# Patient Record
Sex: Female | Born: 1985
Health system: Southern US, Community
[De-identification: ages and names within clinical notes are randomized; demographics above are authoritative.]

## PROBLEM LIST (undated history)

## (undated) DIAGNOSIS — O139 Gestational [pregnancy-induced] hypertension without significant proteinuria, unspecified trimester: Secondary | ICD-10-CM

## (undated) DIAGNOSIS — J45909 Unspecified asthma, uncomplicated: Secondary | ICD-10-CM

## (undated) DIAGNOSIS — F419 Anxiety disorder, unspecified: Secondary | ICD-10-CM

## (undated) DIAGNOSIS — R011 Cardiac murmur, unspecified: Secondary | ICD-10-CM

## (undated) DIAGNOSIS — Z8719 Personal history of other diseases of the digestive system: Secondary | ICD-10-CM

## (undated) DIAGNOSIS — Z8759 Personal history of other complications of pregnancy, childbirth and the puerperium: Secondary | ICD-10-CM

## (undated) DIAGNOSIS — Z21 Asymptomatic human immunodeficiency virus [HIV] infection status: Secondary | ICD-10-CM

## (undated) DIAGNOSIS — K8020 Calculus of gallbladder without cholecystitis without obstruction: Secondary | ICD-10-CM

## (undated) DIAGNOSIS — O24419 Gestational diabetes mellitus in pregnancy, unspecified control: Secondary | ICD-10-CM

## (undated) DIAGNOSIS — M722 Plantar fascial fibromatosis: Secondary | ICD-10-CM

## (undated) DIAGNOSIS — B20 Human immunodeficiency virus [HIV] disease: Secondary | ICD-10-CM

## (undated) DIAGNOSIS — N946 Dysmenorrhea, unspecified: Secondary | ICD-10-CM

## (undated) DIAGNOSIS — A599 Trichomoniasis, unspecified: Secondary | ICD-10-CM

## (undated) DIAGNOSIS — B009 Herpesviral infection, unspecified: Secondary | ICD-10-CM

## (undated) HISTORY — DX: Calculus of gallbladder without cholecystitis without obstruction: K80.20

## (undated) HISTORY — DX: Personal history of other complications of pregnancy, childbirth and the puerperium: Z87.59

## (undated) HISTORY — DX: Personal history of other diseases of the digestive system: Z87.19

## (undated) HISTORY — DX: Gestational diabetes mellitus in pregnancy, unspecified control: O24.419

## (undated) HISTORY — DX: Unspecified asthma, uncomplicated: J45.909

## (undated) HISTORY — PX: WISDOM TOOTH EXTRACTION: SHX21

## (undated) HISTORY — DX: Human immunodeficiency virus (HIV) disease: B20

## (undated) HISTORY — DX: Dysmenorrhea, unspecified: N94.6

## (undated) HISTORY — DX: Asymptomatic human immunodeficiency virus (hiv) infection status: Z21

---

## 2006-03-02 ENCOUNTER — Other Ambulatory Visit: Admission: RE | Admit: 2006-03-02 | Discharge: 2006-03-02 | Payer: Self-pay | Admitting: Obstetrics and Gynecology

## 2008-02-05 ENCOUNTER — Emergency Department (HOSPITAL_COMMUNITY): Admission: EM | Admit: 2008-02-05 | Discharge: 2008-02-05 | Payer: Self-pay | Admitting: Emergency Medicine

## 2009-08-25 ENCOUNTER — Emergency Department (HOSPITAL_COMMUNITY): Admission: EM | Admit: 2009-08-25 | Discharge: 2009-08-25 | Payer: Self-pay | Admitting: Emergency Medicine

## 2009-08-27 ENCOUNTER — Ambulatory Visit (HOSPITAL_COMMUNITY): Admission: RE | Admit: 2009-08-27 | Discharge: 2009-08-27 | Payer: Self-pay | Admitting: Family Medicine

## 2009-09-16 ENCOUNTER — Emergency Department (HOSPITAL_COMMUNITY): Admission: EM | Admit: 2009-09-16 | Discharge: 2009-09-16 | Payer: Self-pay | Admitting: Emergency Medicine

## 2011-03-13 LAB — POCT RAPID STREP A (OFFICE): Streptococcus, Group A Screen (Direct): NEGATIVE

## 2011-03-13 LAB — STREP A DNA PROBE: Group A Strep Probe: NEGATIVE

## 2011-08-29 LAB — BASIC METABOLIC PANEL
BUN: 12
Calcium: 8.3 — ABNORMAL LOW
Creatinine, Ser: 0.93
GFR calc non Af Amer: >60
Glucose, Bld: 102 — ABNORMAL HIGH

## 2011-08-29 LAB — PREGNANCY, URINE: Preg Test, Ur: NEGATIVE

## 2011-08-29 LAB — CBC
HCT: 39.9
Platelets: 140 — ABNORMAL LOW
RDW: 13.6

## 2011-08-29 LAB — URINALYSIS, ROUTINE W REFLEX MICROSCOPIC
Ketones, ur: >80 — AB
Nitrite: NEGATIVE
Specific Gravity, Urine: >1.03 — ABNORMAL HIGH
Urobilinogen, UA: 0.2

## 2011-08-29 LAB — DIFFERENTIAL
Basophils Absolute: 0
Eosinophils Relative: 0
Lymphocytes Relative: 26
Neutro Abs: 2.4

## 2011-08-29 LAB — URINE MICROSCOPIC-ADD ON

## 2012-07-07 ENCOUNTER — Ambulatory Visit (INDEPENDENT_AMBULATORY_CARE_PROVIDER_SITE_OTHER): Payer: BC Managed Care – HMO | Admitting: Obstetrics and Gynecology

## 2012-07-07 ENCOUNTER — Encounter: Payer: Self-pay | Admitting: Obstetrics and Gynecology

## 2012-07-07 VITALS — BP 98/70 | HR 68 | Ht 64.75 in | Wt 152.0 lb

## 2012-07-07 DIAGNOSIS — Z124 Encounter for screening for malignant neoplasm of cervix: Secondary | ICD-10-CM

## 2012-07-07 DIAGNOSIS — Z113 Encounter for screening for infections with a predominantly sexual mode of transmission: Secondary | ICD-10-CM

## 2012-07-07 DIAGNOSIS — Z01419 Encounter for gynecological examination (general) (routine) without abnormal findings: Secondary | ICD-10-CM

## 2012-07-07 NOTE — Progress Notes (Signed)
Subjective:    Barbara Marshall is a 26 y.o. female, G0P0, who presents for an annual exam. The patient reports no problems  Menstrual cycle:   LMP: Patient's last menstrual period was 06/16/2012. Flow x 7 days; 6/10 cramps relieved with Tylenol             Review of Systems Pertinent items are noted in HPI. Denies pelvic pain, urinary tract symptoms, vaginitis symptoms, irregular bleeding, menopausal symptoms, change in bowel habits or rectal bleeding   Objective:  BP 98/70  Pulse 68  Ht 5' 4.75" (1.645 m)  Wt 152 lb (68.947 kg)  BMI 25.49 kg/m2  LMP 06/16/2012  Ht 5' 4.75" (1.645 m)  Wt 152 lb (68.947 kg)  BMI 25.49 kg/m2  LMP 06/16/2012  Wt Readings from Last 1 Encounters:  07/07/12 152 lb (68.947 kg)   Body mass index is 25.49 kg/(m^2). General Appearance: Alert, no acute distress HEENT: Grossly normal Neck / Thyroid: Supple, no thyromegaly or cervical adenopathy Lungs: Clear to auscultation bilaterally Back: No CVA tenderness Breast Exam: No masses or nodes.No dimpling, nipple retraction or discharge. Cardiovascular: Regular rate and rhythm. 2/6 SEM at LSB Gastrointestinal: Soft, non-tender, no masses or organomegaly Pelvic Exam: EGBUS-wnl, vagina-normal rugae, cervix- without lesions or tenderness, uterus appears normal size shape and consistency, adnexae-no masses or tenderness Lymphatic Exam: Non-palpable nodes in neck, clavicular,  axillary, or inguinal regions  Skin: no rashes or abnormalities Extremities: no clubbing cyanosis or edema  Neurologic: grossly normal Psychiatric: Alert and oriented   Assessment:   Routine GYN Exam  HIV + Plan:  STD-pending  PAP sent  RTO 1 year or prn  Rakeb Kibble,ELMIRAPA-C

## 2012-07-07 NOTE — Progress Notes (Signed)
Regular Periods: yes Mammogram: no  Monthly Breast Ex.: yes Exercise: yes  Tetanus < 10 years: yes Seatbelts: yes  NI. Bladder Functn.: yes Abuse at home: no  Daily BM's: yes Stressful Work: yes  Healthy Diet: yes Sigmoid-Colonoscopy: NO  Calcium: no Medical problems this year: NO PROBLEMS   LAST PAP:2009  Contraception: CONDOMS  Mammogram:  NO  PCP: DR. Tana Felts  PMH: NO  FMH: FATHER DIED IN 2008-05-22 BRAIN CANCER  Last Bone Scan: NO

## 2012-07-09 LAB — PAP IG, CT-NG, RFX HPV ASCU
Chlamydia Probe Amp: NEGATIVE
GC Probe Amp: NEGATIVE

## 2012-07-19 ENCOUNTER — Telehealth: Payer: Self-pay | Admitting: Obstetrics and Gynecology

## 2012-07-19 NOTE — Telephone Encounter (Signed)
TRIAGE/RES °

## 2012-07-20 NOTE — Telephone Encounter (Signed)
Triage/lab res. °

## 2012-07-20 NOTE — Telephone Encounter (Signed)
Pt rtnd call, informed pap was normal, GC/CT negative, RPR, Hep B & C all negative, however did show positive for HSV 2.  Discussed with pt a little bit about what HSV 2 was. Pt voices understanding.  Offered appt if want further discussion, also advised to call if she thinks she gets an outbreak.

## 2012-07-20 NOTE — Telephone Encounter (Signed)
Tc to pt regarding msg, lm on vm to call back. 

## 2015-07-25 DIAGNOSIS — J302 Other seasonal allergic rhinitis: Secondary | ICD-10-CM | POA: Insufficient documentation

## 2016-11-28 ENCOUNTER — Encounter: Payer: Self-pay | Admitting: Nurse Practitioner

## 2016-11-28 ENCOUNTER — Ambulatory Visit (INDEPENDENT_AMBULATORY_CARE_PROVIDER_SITE_OTHER): Payer: BLUE CROSS/BLUE SHIELD | Admitting: Nurse Practitioner

## 2016-11-28 VITALS — BP 128/82 | Ht 63.0 in | Wt 178.0 lb

## 2016-11-28 DIAGNOSIS — F419 Anxiety disorder, unspecified: Secondary | ICD-10-CM

## 2016-11-28 DIAGNOSIS — Z13220 Encounter for screening for lipoid disorders: Secondary | ICD-10-CM

## 2016-11-28 DIAGNOSIS — M722 Plantar fascial fibromatosis: Secondary | ICD-10-CM | POA: Diagnosis not present

## 2016-11-28 DIAGNOSIS — R5383 Other fatigue: Secondary | ICD-10-CM | POA: Diagnosis not present

## 2016-11-28 MED ORDER — ALPRAZOLAM 0.5 MG PO TABS: ORAL_TABLET | ORAL | 0 refills | Status: DC

## 2016-11-28 MED ORDER — MELOXICAM 15 MG PO TABS: 15.0000 mg | ORAL_TABLET | Freq: Every day | ORAL | 0 refills | Status: DC

## 2016-11-28 MED ORDER — CITALOPRAM HYDROBROMIDE 20 MG PO TABS: ORAL_TABLET | ORAL | 0 refills | Status: DC

## 2016-11-28 NOTE — Patient Instructions (Signed)
Plantar Fasciitis Plantar fasciitis is a painful foot condition that affects the heel. It occurs when the band of tissue that connects the toes to the heel bone (plantar fascia) becomes irritated. This can happen after exercising too much or doing other repetitive activities (overuse injury). The pain from plantar fasciitis can range from mild irritation to severe pain that makes it difficult for you to walk or move. The pain is usually worse in the morning or after you have been sitting or lying down for a while. CAUSES This condition may be caused by:  Standing for long periods of time.  Wearing shoes that do not fit.  Doing high-impact activities, including running, aerobics, and ballet.  Being overweight.  Having an abnormal way of walking (gait).  Having tight calf muscles.  Having high arches in your feet.  Starting a new athletic activity. SYMPTOMS The main symptom of this condition is heel pain. Other symptoms include:  Pain that gets worse after activity or exercise.  Pain that is worse in the morning or after resting.  Pain that goes away after you walk for a few minutes. DIAGNOSIS This condition may be diagnosed based on your signs and symptoms. Your health care provider will also do a physical exam to check for:  A tender area on the bottom of your foot.  A high arch in your foot.  Pain when you move your foot.  Difficulty moving your foot. You may also need to have imaging studies to confirm the diagnosis. These can include:  X-rays.  Ultrasound.  MRI. TREATMENT  Treatment for plantar fasciitis depends on the severity of the condition. Your treatment may include:  Rest, ice, and over-the-counter pain medicines to manage your pain.  Exercises to stretch your calves and your plantar fascia.  A splint that holds your foot in a stretched, upward position while you sleep (night splint).  Physical therapy to relieve symptoms and prevent problems in the  future.  Cortisone injections to relieve severe pain.  Extracorporeal shock wave therapy (ESWT) to stimulate damaged plantar fascia with electrical impulses. It is often used as a last resort before surgery.  Surgery, if other treatments have not worked after 12 months. HOME CARE INSTRUCTIONS  Take medicines only as directed by your health care provider.  Avoid activities that cause pain.  Roll the bottom of your foot over a bag of ice or a bottle of cold water. Do this for 20 minutes, 3-4 times a day.  Perform simple stretches as directed by your health care provider.  Try wearing athletic shoes with air-sole or gel-sole cushions or soft shoe inserts.  Wear a night splint while sleeping, if directed by your health care provider.  Keep all follow-up appointments with your health care provider. PREVENTION   Do not perform exercises or activities that cause heel pain.  Consider finding low-impact activities if you continue to have problems.  Lose weight if you need to. The best way to prevent plantar fasciitis is to avoid the activities that aggravate your plantar fascia. SEEK MEDICAL CARE IF:  Your symptoms do not go away after treatment with home care measures.  Your pain gets worse.  Your pain affects your ability to move or do your daily activities. This information is not intended to replace advice given to you by your health care provider. Make sure you discuss any questions you have with your health care provider. Document Released: 08/19/2001 Document Revised: 03/17/2016 Document Reviewed: 10/04/2014 Elsevier Interactive Patient Education    2017 Else[employer] Inc.  

## 2016-11-29 ENCOUNTER — Encounter: Payer: Self-pay | Admitting: Nurse Practitioner

## 2016-11-29 DIAGNOSIS — B20 Human immunodeficiency virus [HIV] disease: Secondary | ICD-10-CM | POA: Insufficient documentation

## 2016-11-29 DIAGNOSIS — F419 Anxiety disorder, unspecified: Secondary | ICD-10-CM | POA: Insufficient documentation

## 2016-11-29 LAB — BASIC METABOLIC PANEL
BUN/Creatinine Ratio: 12 (ref 9–23)
BUN: 10 mg/dL (ref 6–20)
CALCIUM: 9.5 mg/dL (ref 8.7–10.2)
CO2: 23 mmol/L (ref 18–29)
CREATININE: 0.86 mg/dL (ref 0.57–1.00)
Chloride: 98 mmol/L (ref 96–106)
GFR, EST AFRICAN AMERICAN: 105 mL/min/{1.73_m2} (ref >59)
GFR, EST NON AFRICAN AMERICAN: 91 mL/min/{1.73_m2} (ref >59)
Glucose: 100 mg/dL — ABNORMAL HIGH (ref 65–99)
POTASSIUM: 4 mmol/L (ref 3.5–5.2)
Sodium: 137 mmol/L (ref 134–144)

## 2016-11-29 LAB — CBC WITH DIFFERENTIAL/PLATELET
BASOS: 0 %
Basophils Absolute: 0 10*3/uL (ref 0.0–0.2)
EOS (ABSOLUTE): 0.3 10*3/uL (ref 0.0–0.4)
EOS: 4 %
HEMOGLOBIN: 14.1 g/dL (ref 11.1–15.9)
Hematocrit: 42.3 % (ref 34.0–46.6)
IMMATURE GRANS (ABS): 0 10*3/uL (ref 0.0–0.1)
IMMATURE GRANULOCYTES: 0 %
LYMPHS: 21 %
Lymphocytes Absolute: 1.8 10*3/uL (ref 0.7–3.1)
MCH: 31.1 pg (ref 26.6–33.0)
MCHC: 33.3 g/dL (ref 31.5–35.7)
MCV: 93 fL (ref 79–97)
MONOCYTES: 7 %
Monocytes Absolute: 0.5 10*3/uL (ref 0.1–0.9)
NEUTROS PCT: 68 %
Neutrophils Absolute: 5.6 10*3/uL (ref 1.4–7.0)
PLATELETS: 314 10*3/uL (ref 150–379)
RBC: 4.54 x10E6/uL (ref 3.77–5.28)
RDW: 12.4 % (ref 12.3–15.4)
WBC: 8.2 10*3/uL (ref 3.4–10.8)

## 2016-11-29 LAB — HEPATIC FUNCTION PANEL
ALBUMIN: 4.8 g/dL (ref 3.5–5.5)
ALT: 24 IU/L (ref 0–32)
AST: 23 IU/L (ref 0–40)
Alkaline Phosphatase: 80 IU/L (ref 39–117)
BILIRUBIN TOTAL: 0.5 mg/dL (ref 0.0–1.2)
Bilirubin, Direct: 0.12 mg/dL (ref 0.00–0.40)
TOTAL PROTEIN: 8.1 g/dL (ref 6.0–8.5)

## 2016-11-29 LAB — LIPID PANEL
CHOL/HDL RATIO: 2.7 ratio (ref 0.0–4.4)
Cholesterol, Total: 151 mg/dL (ref 100–199)
HDL: 55 mg/dL (ref >39)
LDL CALC: 79 mg/dL (ref 0–99)
Triglycerides: 83 mg/dL (ref 0–149)
VLDL CHOLESTEROL CAL: 17 mg/dL (ref 5–40)

## 2016-11-29 LAB — TSH: TSH: 1.21 u[IU]/mL (ref 0.450–4.500)

## 2016-11-29 LAB — VITAMIN D 25 HYDROXY (VIT D DEFICIENCY, FRACTURES): Vit D, 25-Hydroxy: 20.4 ng/mL — ABNORMAL LOW (ref 30.0–100.0)

## 2016-11-29 NOTE — Progress Notes (Addendum)
Subjective:  Presents for the first time in several years. Requests thyroid testing due to strong family history. Is HIV positive. [facility]. Had a dog scratch to left lower back in July, area healed without difficulty. Has rare brief itching in the area but no pain. Tetanus vaccine within past few years. Also having pain on the soles of both feet. Worse first thing in the morning when she first gets up. Better after walking. Has increased stress and anxiety for the past year. Emotional lability. Takes melatonin at bedtime which helps sleep. States her husband's ex-wife has added to stress. Denies suicidal or homicidal thoughts or ideation. No birth control. Regular cycles. No plans for pregnancy. Took 1/2 of a family member's Xanax which helped anxiety.  GAD 7 : Generalized Anxiety Score 11/28/2016  Nervous, Anxious, on Edge 3  Control/stop worrying 3  Worry too much - different things 3  Trouble relaxing 2  Restless 3  Easily annoyed or irritable 2  Afraid - awful might happen 1  Total GAD 7 Score 17  Anxiety Difficulty Not difficult at all    Objective:   BP 128/82   Ht 5\' 3"  (1.6 m)   Wt 178 lb (80.7 kg)   BMI 31.53 kg/m  NAD. Alert, oriented. Thoughts logical, coherent and relevant. Mildly anxious affect. Makes good eye contact. Dressed appropriately. Thyroid non tender; no mass or goiter noted. Lungs clear. Heart RRR. Skin on left back is clear. Palpation of soles of both feet non tender. Gait normal.   Assessment:  Problem List Items Addressed This Visit      Other   Anxiety - Primary   Relevant Medications   citalopram (CELEXA) 20 MG tablet   ALPRAZolam (XANAX) 0.5 MG tablet    Other Visit Diagnoses    Plantar fasciitis, bilateral       Screening cholesterol level       Relevant Orders   Lipid panel (Completed)   Other fatigue       Relevant Orders   Hepatic function panel (Completed)   Basic metabolic panel (Completed)   CBC with  Differential/Platelet (Completed)   TSH (Completed)   VITAMIN D 25 Hydroxy (Vit-D Deficiency, Fractures) (Completed)     Plan:  Meds ordered this encounter  Medications  . abacavir-dolutegravir-lamiVUDine (TRIUMEQ) 600-50-300 MG tablet    Sig: Take by mouth.  . meloxicam (MOBIC) 15 MG tablet    Sig: Take 1 tablet (15 mg total) by mouth daily. Prn foot pain    Dispense:  30 tablet    Refill:  0    Order Specific Question:   Supervising Provider    Answer:   Merlyn AlbertLUKING, WILLIAM S [[id number]]  . citalopram (CELEXA) 20 MG tablet    Sig: 1/2 tab po qhs x 6 d then one po qhs    Dispense:  30 tablet    Refill:  0    Order Specific Question:   Supervising Provider    Answer:   Merlyn AlbertLUKING, WILLIAM S [[id number]]  . ALPRAZolam (XANAX) 0.5 MG tablet    Sig: 1/2 - 1 po BID prn anxiety    Dispense:  30 tablet    Refill:  0    Order Specific Question:   Supervising Provider    Answer:   Riccardo DubinLUKING, WILLIAM S [[id number]]   Given written and verbal information on plantar fasciitis. Trial of Meloxicam. Stop Celexa and call if any adverse effects. Use Xanax sparingly. Routine labs including TSH pending. Gets regular PAP  smears.  Return in about 1 month (around 12/29/2016). Call back sooner if needed.

## 2016-12-02 ENCOUNTER — Other Ambulatory Visit: Payer: Self-pay | Admitting: Nurse Practitioner

## 2016-12-02 MED ORDER — VITAMIN D (ERGOCALCIFEROL) 1.25 MG (50000 UNIT) PO CAPS: 50000.0000 [IU] | ORAL_CAPSULE | ORAL | 2 refills | Status: DC

## 2017-01-07 ENCOUNTER — Encounter: Payer: Self-pay | Admitting: Nurse Practitioner

## 2017-01-07 ENCOUNTER — Ambulatory Visit (INDEPENDENT_AMBULATORY_CARE_PROVIDER_SITE_OTHER): Payer: BLUE CROSS/BLUE SHIELD | Admitting: Nurse Practitioner

## 2017-01-07 VITALS — BP 122/82 | Ht 63.0 in | Wt 176.8 lb

## 2017-01-07 DIAGNOSIS — F419 Anxiety disorder, unspecified: Secondary | ICD-10-CM

## 2017-01-07 DIAGNOSIS — R7301 Impaired fasting glucose: Secondary | ICD-10-CM

## 2017-01-07 LAB — POCT GLYCOSYLATED HEMOGLOBIN (HGB A1C): Hemoglobin A1C: 5

## 2017-01-07 MED ORDER — CITALOPRAM HYDROBROMIDE 20 MG PO TABS: ORAL_TABLET | ORAL | 1 refills | Status: DC

## 2017-01-08 ENCOUNTER — Encounter: Payer: Self-pay | Admitting: Nurse Practitioner

## 2017-01-08 NOTE — Progress Notes (Signed)
Subjective:  Presents for recheck on her anxiety. Ran out of Celexa about a week ago. Was doing extremely well while medication. Has only had to take 3 of her Xanax. Sleeping better. Denies any adverse effects. Gets regular preventive health physicals.  Objective:   BP 122/82   Ht 5\' 3"  (1.6 m)   Wt 176 lb 12.8 oz (80.2 kg)   BMI 31.32 kg/m  NAD. Alert, oriented. Lungs clear. Heart regular rate rhythm. Elevated sugar on last lab work. Results for orders placed or performed in visit on 01/07/17  POCT glycosylated hemoglobin (Hb A1C)  Result Value Ref Range   Hemoglobin A1C 5.0      Assessment:   Problem List Items Addressed This Visit      Other   Anxiety - Primary   Relevant Medications   citalopram (CELEXA) 20 MG tablet    Other Visit Diagnoses    Elevated fasting glucose       Relevant Orders   POCT glycosylated hemoglobin (Hb A1C) (Completed)       Plan:  Meds ordered this encounter  Medications  . citalopram (CELEXA) 20 MG tablet    Sig: one po qhs    Dispense:  90 tablet    Refill:  1    Order Specific Question:   Supervising Provider    Answer:   Merlyn AlbertLUKING, WILLIAM S [[id number]]   Continue Celexa as directed. Continue to use Xanax sparingly. Encouraged healthy diet low in sugar and simple carbs and regular activity. Return in about 4 months (around 05/07/2017) for recheck. Call back sooner if needed.

## 2017-02-03 DIAGNOSIS — B20 Human immunodeficiency virus [HIV] disease: Secondary | ICD-10-CM | POA: Diagnosis not present

## 2017-02-03 DIAGNOSIS — Z23 Encounter for immunization: Secondary | ICD-10-CM | POA: Diagnosis not present

## 2017-02-03 DIAGNOSIS — Z79899 Other long term (current) drug therapy: Secondary | ICD-10-CM | POA: Diagnosis not present

## 2017-02-03 DIAGNOSIS — Z2821 Immunization not carried out because of patient refusal: Secondary | ICD-10-CM | POA: Diagnosis not present

## 2017-02-04 DIAGNOSIS — E559 Vitamin D deficiency, unspecified: Secondary | ICD-10-CM | POA: Insufficient documentation

## 2017-03-03 ENCOUNTER — Other Ambulatory Visit: Payer: Self-pay | Admitting: Nurse Practitioner

## 2017-04-02 ENCOUNTER — Other Ambulatory Visit: Payer: Self-pay | Admitting: Nurse Practitioner

## 2017-04-15 ENCOUNTER — Other Ambulatory Visit: Payer: Self-pay | Admitting: *Deleted

## 2017-04-28 ENCOUNTER — Other Ambulatory Visit: Payer: Self-pay | Admitting: Nurse Practitioner

## 2017-04-28 DIAGNOSIS — Z6832 Body mass index (BMI) 32.0-32.9, adult: Secondary | ICD-10-CM | POA: Diagnosis not present

## 2017-04-28 DIAGNOSIS — Z01419 Encounter for gynecological examination (general) (routine) without abnormal findings: Secondary | ICD-10-CM | POA: Diagnosis not present

## 2017-04-28 DIAGNOSIS — Z3041 Encounter for surveillance of contraceptive pills: Secondary | ICD-10-CM | POA: Diagnosis not present

## 2017-04-29 NOTE — Telephone Encounter (Signed)
I am sorry thaat needs to be prescribed by pts specialist, I do not rx these meds

## 2017-05-07 ENCOUNTER — Encounter: Payer: Self-pay | Admitting: Nurse Practitioner

## 2017-05-07 ENCOUNTER — Ambulatory Visit (INDEPENDENT_AMBULATORY_CARE_PROVIDER_SITE_OTHER): Payer: BLUE CROSS/BLUE SHIELD | Admitting: Nurse Practitioner

## 2017-05-07 VITALS — BP 118/78 | Ht 66.0 in | Wt 184.2 lb

## 2017-05-07 DIAGNOSIS — F419 Anxiety disorder, unspecified: Secondary | ICD-10-CM | POA: Diagnosis not present

## 2017-05-07 NOTE — Progress Notes (Signed)
Subjective:  Presents for recheck on her anxiety. Started a regular walking program. Very picky eater. Gets regular preventive health physicals. Doing well on Celexa. Sleeping well. Has only taken 4 of her Xanax since being prescribed.  Objective:   BP 118/78   Ht 5\' 6"  (1.676 m)   Wt 184 lb 3.2 oz (83.6 kg)   BMI 29.73 kg/m  NAD. Alert, oriented. Thoughts logical coherent and relevant. Dressed appropriately. Making good eye contact. Lungs clear. Heart regular rate rhythm.  Assessment:   Problem List Items Addressed This Visit      Other   Anxiety - Primary       Plan:   Continue current regimen as directed. Encourage regular activity and weight loss.   Return in about 6 months (around 11/06/2017) for recheck and labs. Call back sooner if needed.

## 2017-06-01 ENCOUNTER — Other Ambulatory Visit: Payer: Self-pay | Admitting: Nurse Practitioner

## 2017-08-03 DIAGNOSIS — B20 Human immunodeficiency virus [HIV] disease: Secondary | ICD-10-CM | POA: Diagnosis not present

## 2017-08-05 ENCOUNTER — Ambulatory Visit (INDEPENDENT_AMBULATORY_CARE_PROVIDER_SITE_OTHER): Payer: BLUE CROSS/BLUE SHIELD | Admitting: Nurse Practitioner

## 2017-08-05 ENCOUNTER — Encounter: Payer: Self-pay | Admitting: Nurse Practitioner

## 2017-08-05 VITALS — BP 154/80 | Temp 98.8°F | Ht 66.0 in | Wt 184.0 lb

## 2017-08-05 DIAGNOSIS — H8110 Benign paroxysmal vertigo, unspecified ear: Secondary | ICD-10-CM

## 2017-08-05 MED ORDER — SCOPOLAMINE 1 MG/3DAYS TD PT72: 1.0000 | MEDICATED_PATCH | TRANSDERMAL | 0 refills | Status: DC

## 2017-08-05 NOTE — Patient Instructions (Signed)
Meclizine 25 mg every 6 hours as needed   

## 2017-08-06 ENCOUNTER — Encounter: Payer: Self-pay | Admitting: Nurse Practitioner

## 2017-08-06 NOTE — Progress Notes (Signed)
Subjective:  Presents for c/o sudden onset dizziness that began yesterday. Occurs with turning her head, position change or standing. Still feels spinning sensation when she closes her eyes. N/V yesterday during the worst of symptoms, none today. Much improved; had mild dizziness this am. None at this time. No fever, head congestion, runny nose, sore throat, ear pain or cough. No change in chronic headaches. No numbness or weakness in the face, arms or legs. No difficulty speaking or swallowing. No visual problems. No CP/SOB or palpitations. Husband present with her today; has background as a paramedic.   Objective:   BP (!) 154/80 (BP Location: Right Arm, Cuff Size: Normal)   Temp 98.8 F (37.1 C) (Oral)   Ht 5\' 6"  (1.676 m)   Wt 184 lb (83.5 kg)   BMI 29.70 kg/m  NAD. Alert, oriented. Cheerful affect. TMs clear effusion, no erythema.. Pupils equal and reactive to light. EOMs intact without nystagmus. Pharynx clear. Neck supple with minimal adenopathy. Lungs clear. Heart RRR. Point to point localization normal. Patellar reflexes normal. Gait normal. Romberg neg.   Assessment:  Benign paroxysmal positional vertigo, unspecified laterality    Plan:   Meds ordered this encounter  Medications  . scopolamine (TRANSDERM-SCOP) 1 MG/3DAYS    Sig: Place 1 patch (1.5 mg total) onto the skin every 3 (three) days.    Dispense:  4 patch    Refill:  0    Order Specific Question:   Supervising Provider    Answer:   Merlyn AlbertLUKING, WILLIAM S [[id number]]   Patient [place] preparing [place] Has a history of car sickness. Given Rx for patches. Discussed potential adverse effects. DC med if any problems. Consider OTC meclizine but do not take with patch. Warning signs reviewed. Husband will monitor BP outside of office. Call back if remains over 140/90.  Return if symptoms worsen or fail to improve.  25 minutes was spent with the patient. Greater than half the time was spent in discussion and  answering questions and counseling regarding the issues that the patient came in for today.

## 2017-08-06 NOTE — Addendum Note (Signed)
Addended by: Campbell RichesHOSKINS, Bradey Luzier C on: 08/06/2017 07:32 AM   Modules accepted: Level of Service

## 2017-08-18 ENCOUNTER — Other Ambulatory Visit: Payer: Self-pay | Admitting: Nurse Practitioner

## 2017-08-18 NOTE — Telephone Encounter (Signed)
Ok plus two mo ref 

## 2017-10-06 ENCOUNTER — Encounter: Payer: Self-pay | Admitting: Nurse Practitioner

## 2017-10-19 DIAGNOSIS — Z3169 Encounter for other general counseling and advice on procreation: Secondary | ICD-10-CM | POA: Diagnosis not present

## 2017-10-19 DIAGNOSIS — B20 Human immunodeficiency virus [HIV] disease: Secondary | ICD-10-CM | POA: Diagnosis not present

## 2017-11-04 ENCOUNTER — Encounter: Payer: Self-pay | Admitting: Nurse Practitioner

## 2017-11-04 ENCOUNTER — Ambulatory Visit (INDEPENDENT_AMBULATORY_CARE_PROVIDER_SITE_OTHER): Payer: BLUE CROSS/BLUE SHIELD | Admitting: Nurse Practitioner

## 2017-11-04 VITALS — BP 140/90 | Ht 66.0 in | Wt 188.1 lb

## 2017-11-04 DIAGNOSIS — J309 Allergic rhinitis, unspecified: Secondary | ICD-10-CM | POA: Diagnosis not present

## 2017-11-04 DIAGNOSIS — F419 Anxiety disorder, unspecified: Secondary | ICD-10-CM

## 2017-11-04 MED ORDER — ALPRAZOLAM 0.5 MG PO TABS: ORAL_TABLET | ORAL | 0 refills | Status: DC

## 2017-11-04 MED ORDER — ALBUTEROL SULFATE HFA 108 (90 BASE) MCG/ACT IN AERS
2.0000 | INHALATION_SPRAY | RESPIRATORY_TRACT | 0 refills | Status: DC | PRN
Start: 2017-11-04 — End: 2019-11-18

## 2017-11-04 MED ORDER — CITALOPRAM HYDROBROMIDE 20 MG PO TABS: 20.0000 mg | ORAL_TABLET | Freq: Every day | ORAL | 1 refills | Status: DC

## 2017-11-05 ENCOUNTER — Encounter: Payer: Self-pay | Admitting: Nurse Practitioner

## 2017-11-05 ENCOUNTER — Ambulatory Visit: Payer: BLUE CROSS/BLUE SHIELD | Admitting: Nurse Practitioner

## 2017-11-05 NOTE — Progress Notes (Signed)
Subjective: Presents for routine follow-up on her anxiety.  Takes occasional Xanax mainly for sleep.  Moved into a new home about a month ago which seemed to trigger some insomnia.  Also the stress of getting ready for the holidays.  Melatonin has helped her go to sleep but does not keep her asleep.  Overall anxiety has been stable on citalopram.  Gets regular preventive health physicals through gynecology.  Has noticed more flareups of her allergies lately.  Has seen an allergy specialist many years ago.  Would like another referral to local specialist.  Has occasional wheezing particularly with exposure to very cold air or certain environmental triggers.  Has not been on albuterol in a long time, requesting a new prescription for an inhaler.  Objective:   BP 140/90   Ht 5\' 6"  (1.676 m)   Wt 188 lb 2 oz (85.3 kg)   BMI 30.36 kg/m  NAD.  Alert, oriented.  TMs mild clear effusion, no erythema.  Pharynx non erythematous.  Neck supple with mild soft anterior adenopathy.  Lungs clear.  Heart regular rate and rhythm.  Cheerful affect.  Thoughts logical coherent and relevant.  Dressed appropriately.  Making good eye contact.  Assessment:   Problem List Items Addressed This Visit      Other   Anxiety - Primary   Relevant Medications   citalopram (CELEXA) 20 MG tablet   ALPRAZolam (XANAX) 0.5 MG tablet    Other Visit Diagnoses    Allergic rhinitis, unspecified seasonality, unspecified trigger           Plan:   Meds ordered this encounter  Medications  . citalopram (CELEXA) 20 MG tablet    Sig: Take 1 tablet (20 mg total) by mouth at bedtime.    Dispense:  90 tablet    Refill:  1    Order Specific Question:   Supervising Provider    Answer:   Merlyn AlbertLUKING, WILLIAM S [[id number]]  . ALPRAZolam (XANAX) 0.5 MG tablet    Sig: 1/2 - 1 po BID prn anxiety    Dispense:  30 tablet    Refill:  0    Order Specific Question:   Supervising Provider    Answer:   Merlyn AlbertLUKING, WILLIAM S [[id number]]  . albuterol (PROVENTIL  HFA;VENTOLIN HFA) 108 (90 Base) MCG/ACT inhaler    Sig: Inhale 2 puffs into the lungs every 4 (four) hours as needed.    Dispense:  1 Inhaler    Refill:  0    Order Specific Question:   Supervising Provider    Answer:   Merlyn AlbertLUKING, WILLIAM S [[id number]]   Continue Xanax mainly at bedtime as needed for sleep.  Call back in a few weeks if insomnia does not improve.  May be an issue of recent move to a new home.  Will refer to allergy specialist for evaluation, call back sooner if symptoms worsen.  Defers flu vaccine. Return in about 6 months (around 05/04/2018) for routine follow up.

## 2017-11-11 ENCOUNTER — Encounter: Payer: Self-pay | Admitting: Family Medicine

## 2017-11-23 ENCOUNTER — Ambulatory Visit (INDEPENDENT_AMBULATORY_CARE_PROVIDER_SITE_OTHER): Payer: BLUE CROSS/BLUE SHIELD | Admitting: Family Medicine

## 2017-11-23 ENCOUNTER — Encounter: Payer: Self-pay | Admitting: Family Medicine

## 2017-11-23 VITALS — BP 134/80 | Temp 99.6°F | Ht 66.0 in | Wt 182.0 lb

## 2017-11-23 DIAGNOSIS — J329 Chronic sinusitis, unspecified: Secondary | ICD-10-CM | POA: Diagnosis not present

## 2017-11-23 MED ORDER — PREDNISONE 10 MG PO TABS: ORAL_TABLET | ORAL | 0 refills | Status: DC

## 2017-11-23 MED ORDER — CEFDINIR 300 MG PO CAPS: 300.0000 mg | ORAL_CAPSULE | Freq: Two times a day (BID) | ORAL | 0 refills | Status: DC

## 2017-11-23 NOTE — Progress Notes (Signed)
   Subjective:    Patient ID: Barbara Marshall, female    DOB: 03-Sep-1986, 31 y.o.   MRN: [id number]  Sinusitis  This is a new problem. Episode onset: 2 days. Associated symptoms include headaches and a sore throat. (Fever, wheezing, cough, vomiting, bodyaches) Past treatments include acetaminophen (alka-seltzer).   fri felt a little slugggish  Sat morn woke up and felt bad  Started having chills n d fever rigt off the bad  Coughing a lot   Body aches  Dim energy   yest more of the same  Ongoing fever  Dim energy   Felt a lite better tod, appetite somewha tbetter     Cough not prod  Some sob and tightness with the  Pos use of inhaler     Review of Systems  HENT: Positive for sore throat.   Neurological: Positive for headaches.       Objective:   Physical Exam  Alert, mild malaise. Hydration good Vitals stable. frontal/ maxillary tenderness evident positive nasal congestion. pharynx normal neck supple  lungs clear/no crackles or wheezes. heart regular in rhythm       Assessment & Plan:  Impression rhinosinusitis likely post viral, discussed with patient. plan antibiotics prescribed. Questions answered. Symptomatic care discussed. warning signs discussed. WSL

## 2017-11-24 MED ORDER — BENZONATATE 100 MG PO CAPS: 100.0000 mg | ORAL_CAPSULE | Freq: Three times a day (TID) | ORAL | 0 refills | Status: DC | PRN

## 2017-11-29 ENCOUNTER — Other Ambulatory Visit: Payer: Self-pay | Admitting: Nurse Practitioner

## 2017-12-08 NOTE — L&D Delivery Note (Signed)
Delivery Note   Rondel BatonWiseman, BoyA Barbara Marshall [[id number]][[id number]]  Patient pushed for less than 30 minutes after she was noted to be C/C/+2. At 9:12 PM a non-viable and healthy female was delivered via Vaginal, Spontaneous (Presentation:ROA).  APGAR: 8, 9; weight pending.  Baby was laid on maternal abdomen and Delayed cord clamping was done. Cord double clamped and cut by father and baby handed over to  Waiting Pediatricians / NICU team.  The placenta was not delivered until delivery of Baby B.  Venous cord gases and cord blood was obtained.  Complications: None  Anesthesia:  Epidural  Episiotomy:  None Lacerations:  None  Suture Repair: n/a Est. Blood Loss (mL):  300 mL    Gilmer MorWiseman, BoyB Barbara Marshall [[id number]][[id number]]  Bedside ultrasound confirmed vertex placement of Baby B. Patient pushed for 10 minutes then the fetal heart beat was not traceable by external fetal monitoring.  Bedside US showed fetal bradycardia. Quickly discussed with patient operative delivery with a vacuum. Verbal consent: obtained from patient.  Risks and benefits discussed in detail.  Risks include, but are not limited to the risks of anesthesia, bleeding, infection, damage to maternal tissues, fetal cephalhematoma.  There is also the risk of inability to effect vaginal delivery of the head, or shoulder dystocia that cannot be resolved by established maneuvers, leading to the need for emergency cesarean section.   Head determined to be direct OA. Bladder was just previously emptied by straight catheterization. Epidural was found to be adequate.  Vertex was +3 station. The Kiwi vacuum was placed.  With maternal effort and 3 pulls,  At 9:23 PM a viable and healthy female was delivered via Vaginal, Vacuum (Extractor) (Presentation: OA restituted to ROA).  APGAR: pending. weight pending.  shoulders and body were easily delivered. Baby came out crying so was laid on maternal abdomen.  Delayed cord clamping was done then cord was double clamped and cut by father  and the infant handed to the waiting Pediatricians.  Infant was noted to be moving all four extremities and crying vigorously. Venous cord gases and cord blood was obtained. The placenta delivered spontaneously intact 3 vessels noted.     Uterine atony was present and with IV pitocin and massage.  There were no noted vaginal or perineal lacerations.   The patient tolerated procedure well.     Anesthesia: Epidural Episiotomy:  None  Lacerations:  None Suture Repair: n/a Est. Blood Loss (mL):  300   Mom to postpartum.   Baby A to NICU.   Baby B to NICU.  Barbara Marshall, Barbara Marshall 07/26/2018, 9:45 PM

## 2017-12-16 DIAGNOSIS — Z319 Encounter for procreative management, unspecified: Secondary | ICD-10-CM | POA: Diagnosis not present

## 2017-12-31 ENCOUNTER — Encounter: Payer: Self-pay | Admitting: Nurse Practitioner

## 2018-01-07 DIAGNOSIS — Z113 Encounter for screening for infections with a predominantly sexual mode of transmission: Secondary | ICD-10-CM | POA: Diagnosis not present

## 2018-01-07 DIAGNOSIS — Z3490 Encounter for supervision of normal pregnancy, unspecified, unspecified trimester: Secondary | ICD-10-CM

## 2018-01-07 DIAGNOSIS — Z3491 Encounter for supervision of normal pregnancy, unspecified, first trimester: Secondary | ICD-10-CM | POA: Diagnosis not present

## 2018-01-07 LAB — OB RESULTS CONSOLE RPR: RPR: NONREACTIVE

## 2018-01-07 LAB — OB RESULTS CONSOLE HEPATITIS B SURFACE ANTIGEN: Hepatitis B Surface Ag: NEGATIVE

## 2018-01-07 LAB — OB RESULTS CONSOLE RUBELLA ANTIBODY, IGM: Rubella: NON-IMMUNE/NOT IMMUNE

## 2018-01-07 LAB — OB RESULTS CONSOLE HIV ANTIBODY (ROUTINE TESTING): HIV: REACTIVE

## 2018-01-09 DIAGNOSIS — Z21 Asymptomatic human immunodeficiency virus [HIV] infection status: Secondary | ICD-10-CM | POA: Insufficient documentation

## 2018-01-09 DIAGNOSIS — B20 Human immunodeficiency virus [HIV] disease: Secondary | ICD-10-CM | POA: Insufficient documentation

## 2018-01-11 ENCOUNTER — Encounter (HOSPITAL_COMMUNITY): Payer: Self-pay | Admitting: *Deleted

## 2018-01-12 ENCOUNTER — Encounter (HOSPITAL_COMMUNITY): Payer: Self-pay

## 2018-01-12 ENCOUNTER — Ambulatory Visit (HOSPITAL_COMMUNITY)
Admission: RE | Admit: 2018-01-12 | Discharge: 2018-01-12 | Disposition: A | Discharge status: Home / Self Care | Payer: BLUE CROSS/BLUE SHIELD | Source: Ambulatory Visit | Attending: Obstetrics and Gynecology | Admitting: Obstetrics and Gynecology

## 2018-01-12 DIAGNOSIS — Z21 Asymptomatic human immunodeficiency virus [HIV] infection status: Secondary | ICD-10-CM | POA: Diagnosis not present

## 2018-01-12 DIAGNOSIS — Z3A01 Less than 8 weeks gestation of pregnancy: Secondary | ICD-10-CM | POA: Insufficient documentation

## 2018-01-12 DIAGNOSIS — O98711 Human immunodeficiency virus [HIV] disease complicating pregnancy, first trimester: Secondary | ICD-10-CM | POA: Diagnosis not present

## 2018-01-12 HISTORY — DX: Anxiety disorder, unspecified: F41.9

## 2018-01-12 HISTORY — DX: Plantar fascial fibromatosis: M72.2

## 2018-01-12 HISTORY — DX: Herpesviral infection, unspecified: B00.9

## 2018-01-12 HISTORY — DX: Trichomoniasis, unspecified: A59.9

## 2018-01-12 NOTE — Consult Note (Addendum)
MFM Consult Note  32 year old, G1, at 5 weeks 1 day gestation by LMP with an EDC of 09/13/2018 for consultation today about pregnancy management.  She notes that she had talked with her ID provider about the possibility of planning pregnancy and was given the ok and placed on Gummi vitamens in preparation. She has been compliant with medications and HIV RNA was not detected on 08/04/2017 routine 6 month follow-up. She notes that her next visit will be in March with Dr. Manfred Arch of [facility].   She has had no issues so far this pregnancy.  Past Medical History:  Diagnosis Date  . Anxiety   . Asthma   . Dysmenorrhea    H/O  . History of IBS   . HIV infection (HCC)   . HSV infection   . Plantar fasciitis   . Trichomonas infection    Past Surgical History:  Procedure Laterality Date  . WISDOM TOOTH EXTRACTION     Social History   Socioeconomic History  . Marital status: Single    Spouse name: Not on file  . Number of children: Not on file  . Years of education: Not on file  . Highest education level: Not on file  Social Needs  . Financial resource strain: Not on file  . Food insecurity - worry: Not on file  . Food insecurity - inability: Not on file  . Transportation needs - medical: Not on file  . Transportation needs - non-medical: Not on file  Occupational History  . Not on file  Tobacco Use  . Smoking status: Never Smoker  . Smokeless tobacco: Never Used  Substance and Sexual Activity  . Alcohol use: Yes    Comment: SOCIAL  . Drug use: No  . Sexual activity: Not Currently    Birth control/protection: Condom  Other Topics Concern  . Not on file  Social History Narrative  . Not on file   Family History  Problem Relation Age of Onset  . Cancer Father        BRAIN  . Asthma Father   . Diabetes Father   . Thyroid disease Mother   . Migraines Mother    Current Outpatient Medications on File Prior to  Encounter Medication Sig Dispense Refill . abacavir-dolutegravir-lamiVUDine (TRIUMEQ) 600-50-300 MG tablet Take by mouth.   Marland Kitchen albuterol (PROVENTIL HFA;VENTOLIN HFA) 108 (90 Base) MCG/ACT inhaler Inhale 2 puffs into the lungs every 4 (four) hours as needed. 1 Inhaler 0 . cefdinir (OMNICEF) 300 MG capsule Take 1 capsule (300 mg total) by mouth 2 (two) times daily. 20 capsule 0 . Prenatal Vit-Fe Fumarate-FA (PRENATAL VITAMIN PO) Take by mouth.    No current facility-administered medications on file prior to encounter.     No Known Allergies  Impression/Plan #1 HIV in pregnancy  - conditions were optimized before planning pregnancy. We discussed the expected course during pregnancy with focus of the effect of HIV on pregnancy, pregnancy on HIV and her current medications on the developing baby. We discussed the need for early pregnancy dating (ultasound at ~7-[redacted] weeks gestation, the option of first trimester screen at 10-14 weeks, fetal anatomic survey at 18-20 weeks, early glucola at 38-20 with repeat at 24-28 weeks if the first screen was wnl, and route of delivery with continued non-detectable HIV RNA. We discussed options should these levels rise during pregnancy (unlikely given her degree of control of an extended period of time.   Questions appear answered to her satisfaction. Precautions for  the above given. Spent greater than 1/2 of 35 minute visit face to face counseling.  ADDENDUM Mode of delivery is not affected by her current HIV status of undetectable viral load, so cesarean delivery should be reserved for obstetric indications or a rise in her HIV viral load If her viral load levels rise, we would be glad through a follow-up consult discuss options based on her current gestational age and viral load.

## 2018-01-15 ENCOUNTER — Ambulatory Visit (HOSPITAL_COMMUNITY): Payer: Self-pay

## 2018-01-19 ENCOUNTER — Ambulatory Visit: Payer: BLUE CROSS/BLUE SHIELD | Admitting: Allergy & Immunology

## 2018-01-19 LAB — OB RESULTS CONSOLE GC/CHLAMYDIA
Chlamydia: NEGATIVE
Gonorrhea: NEGATIVE

## 2018-02-04 DIAGNOSIS — O3680X9 Pregnancy with inconclusive fetal viability, other fetus: Secondary | ICD-10-CM | POA: Diagnosis not present

## 2018-02-04 DIAGNOSIS — Z3A01 Less than 8 weeks gestation of pregnancy: Secondary | ICD-10-CM | POA: Diagnosis not present

## 2018-02-26 NOTE — Addendum Note (Signed)
Encounter addended by: Ledon SnareBrost, Shelagh Rayman, MD on: 02/26/2018 8:59 AM  Actions taken: Sign clinical note

## 2018-03-04 ENCOUNTER — Encounter: Payer: Self-pay | Admitting: Nurse Practitioner

## 2018-03-05 ENCOUNTER — Other Ambulatory Visit: Payer: Self-pay | Admitting: Nurse Practitioner

## 2018-03-05 MED ORDER — SCOPOLAMINE 1 MG/3DAYS TD PT72: 1.0000 | MEDICATED_PATCH | TRANSDERMAL | 0 refills | Status: DC

## 2018-03-11 DIAGNOSIS — O3680X9 Pregnancy with inconclusive fetal viability, other fetus: Secondary | ICD-10-CM | POA: Diagnosis not present

## 2018-03-11 DIAGNOSIS — Z3A13 13 weeks gestation of pregnancy: Secondary | ICD-10-CM | POA: Diagnosis not present

## 2018-03-11 DIAGNOSIS — O98719 Human immunodeficiency virus [HIV] disease complicating pregnancy, unspecified trimester: Secondary | ICD-10-CM | POA: Diagnosis not present

## 2018-03-11 DIAGNOSIS — O0991 Supervision of high risk pregnancy, unspecified, first trimester: Secondary | ICD-10-CM | POA: Diagnosis not present

## 2018-04-06 DIAGNOSIS — Z3492 Encounter for supervision of normal pregnancy, unspecified, second trimester: Secondary | ICD-10-CM | POA: Diagnosis not present

## 2018-04-06 DIAGNOSIS — O30049 Twin pregnancy, dichorionic/diamniotic, unspecified trimester: Secondary | ICD-10-CM | POA: Diagnosis not present

## 2018-04-06 DIAGNOSIS — O98719 Human immunodeficiency virus [HIV] disease complicating pregnancy, unspecified trimester: Secondary | ICD-10-CM | POA: Diagnosis not present

## 2018-04-06 DIAGNOSIS — Z3A17 17 weeks gestation of pregnancy: Secondary | ICD-10-CM | POA: Diagnosis not present

## 2018-04-06 DIAGNOSIS — Z3686 Encounter for antenatal screening for cervical length: Secondary | ICD-10-CM | POA: Diagnosis not present

## 2018-04-07 ENCOUNTER — Other Ambulatory Visit (HOSPITAL_COMMUNITY): Payer: Self-pay | Admitting: Obstetrics & Gynecology

## 2018-04-07 DIAGNOSIS — Z3A20 20 weeks gestation of pregnancy: Secondary | ICD-10-CM

## 2018-04-07 DIAGNOSIS — O30002 Twin pregnancy, unspecified number of placenta and unspecified number of amniotic sacs, second trimester: Secondary | ICD-10-CM

## 2018-04-07 DIAGNOSIS — Z363 Encounter for antenatal screening for malformations: Secondary | ICD-10-CM

## 2018-04-28 ENCOUNTER — Ambulatory Visit (HOSPITAL_COMMUNITY)
Admission: RE | Admit: 2018-04-28 | Discharge: 2018-04-28 | Disposition: A | Discharge status: Home / Self Care | Payer: BLUE CROSS/BLUE SHIELD | Source: Ambulatory Visit | Attending: Obstetrics & Gynecology | Admitting: Obstetrics & Gynecology

## 2018-04-28 ENCOUNTER — Encounter (HOSPITAL_COMMUNITY): Payer: Self-pay

## 2018-04-28 ENCOUNTER — Other Ambulatory Visit (HOSPITAL_COMMUNITY): Payer: Self-pay | Admitting: *Deleted

## 2018-04-28 DIAGNOSIS — O30042 Twin pregnancy, dichorionic/diamniotic, second trimester: Secondary | ICD-10-CM

## 2018-04-28 DIAGNOSIS — O30002 Twin pregnancy, unspecified number of placenta and unspecified number of amniotic sacs, second trimester: Secondary | ICD-10-CM

## 2018-04-28 DIAGNOSIS — Z363 Encounter for antenatal screening for malformations: Secondary | ICD-10-CM | POA: Diagnosis not present

## 2018-04-28 DIAGNOSIS — O98712 Human immunodeficiency virus [HIV] disease complicating pregnancy, second trimester: Secondary | ICD-10-CM | POA: Diagnosis not present

## 2018-04-28 DIAGNOSIS — Z3A20 20 weeks gestation of pregnancy: Secondary | ICD-10-CM | POA: Insufficient documentation

## 2018-05-05 ENCOUNTER — Ambulatory Visit: Payer: BLUE CROSS/BLUE SHIELD | Admitting: Nurse Practitioner

## 2018-05-19 ENCOUNTER — Other Ambulatory Visit: Payer: Self-pay | Admitting: Nurse Practitioner

## 2018-05-24 ENCOUNTER — Ambulatory Visit (HOSPITAL_COMMUNITY)
Admission: RE | Admit: 2018-05-24 | Discharge: 2018-05-24 | Disposition: A | Discharge status: Home / Self Care | Payer: BLUE CROSS/BLUE SHIELD | Source: Ambulatory Visit | Attending: Obstetrics & Gynecology | Admitting: Obstetrics & Gynecology

## 2018-05-24 ENCOUNTER — Other Ambulatory Visit (HOSPITAL_COMMUNITY): Payer: Self-pay | Admitting: Obstetrics and Gynecology

## 2018-05-24 ENCOUNTER — Other Ambulatory Visit (HOSPITAL_COMMUNITY): Payer: Self-pay | Admitting: *Deleted

## 2018-05-24 DIAGNOSIS — Z362 Encounter for other antenatal screening follow-up: Secondary | ICD-10-CM | POA: Diagnosis not present

## 2018-05-24 DIAGNOSIS — Z3A24 24 weeks gestation of pregnancy: Secondary | ICD-10-CM | POA: Diagnosis not present

## 2018-05-24 DIAGNOSIS — O30049 Twin pregnancy, dichorionic/diamniotic, unspecified trimester: Secondary | ICD-10-CM

## 2018-05-24 DIAGNOSIS — O98712 Human immunodeficiency virus [HIV] disease complicating pregnancy, second trimester: Secondary | ICD-10-CM

## 2018-05-24 DIAGNOSIS — O30042 Twin pregnancy, dichorionic/diamniotic, second trimester: Secondary | ICD-10-CM

## 2018-05-24 DIAGNOSIS — O321XX0 Maternal care for breech presentation, not applicable or unspecified: Secondary | ICD-10-CM | POA: Insufficient documentation

## 2018-05-25 NOTE — Addendum Note (Signed)
Encounter addended by: Reita ChardPaschal, Phoenyx Paulsen R, RN on: 05/25/2018 2:11 PM  Actions taken: Charge Capture section accepted

## 2018-05-27 ENCOUNTER — Encounter (HOSPITAL_COMMUNITY): Payer: Self-pay

## 2018-05-31 DIAGNOSIS — Z79899 Other long term (current) drug therapy: Secondary | ICD-10-CM | POA: Diagnosis not present

## 2018-05-31 DIAGNOSIS — B20 Human immunodeficiency virus [HIV] disease: Secondary | ICD-10-CM | POA: Diagnosis not present

## 2018-05-31 DIAGNOSIS — Z3A22 22 weeks gestation of pregnancy: Secondary | ICD-10-CM | POA: Diagnosis not present

## 2018-06-21 ENCOUNTER — Ambulatory Visit (HOSPITAL_COMMUNITY): Payer: BLUE CROSS/BLUE SHIELD

## 2018-06-23 ENCOUNTER — Ambulatory Visit (HOSPITAL_COMMUNITY)
Admission: RE | Admit: 2018-06-23 | Discharge: 2018-06-23 | Disposition: A | Discharge status: Home / Self Care | Payer: BLUE CROSS/BLUE SHIELD | Source: Ambulatory Visit | Attending: Obstetrics & Gynecology | Admitting: Obstetrics & Gynecology

## 2018-06-23 ENCOUNTER — Encounter (HOSPITAL_COMMUNITY): Payer: Self-pay

## 2018-06-23 ENCOUNTER — Other Ambulatory Visit (HOSPITAL_COMMUNITY): Payer: Self-pay | Admitting: *Deleted

## 2018-06-23 DIAGNOSIS — O30043 Twin pregnancy, dichorionic/diamniotic, third trimester: Secondary | ICD-10-CM

## 2018-06-23 DIAGNOSIS — O30049 Twin pregnancy, dichorionic/diamniotic, unspecified trimester: Secondary | ICD-10-CM | POA: Insufficient documentation

## 2018-06-23 DIAGNOSIS — Z3A28 28 weeks gestation of pregnancy: Secondary | ICD-10-CM

## 2018-06-23 DIAGNOSIS — O99213 Obesity complicating pregnancy, third trimester: Secondary | ICD-10-CM | POA: Diagnosis not present

## 2018-06-23 DIAGNOSIS — Z21 Asymptomatic human immunodeficiency virus [HIV] infection status: Secondary | ICD-10-CM | POA: Insufficient documentation

## 2018-06-23 DIAGNOSIS — O98713 Human immunodeficiency virus [HIV] disease complicating pregnancy, third trimester: Secondary | ICD-10-CM | POA: Diagnosis not present

## 2018-06-29 DIAGNOSIS — Z3493 Encounter for supervision of normal pregnancy, unspecified, third trimester: Secondary | ICD-10-CM | POA: Diagnosis not present

## 2018-06-29 DIAGNOSIS — Z3A29 29 weeks gestation of pregnancy: Secondary | ICD-10-CM | POA: Diagnosis not present

## 2018-06-29 DIAGNOSIS — Z3686 Encounter for antenatal screening for cervical length: Secondary | ICD-10-CM | POA: Diagnosis not present

## 2018-07-06 DIAGNOSIS — O99810 Abnormal glucose complicating pregnancy: Secondary | ICD-10-CM | POA: Diagnosis not present

## 2018-07-14 DIAGNOSIS — Z3A31 31 weeks gestation of pregnancy: Secondary | ICD-10-CM | POA: Diagnosis not present

## 2018-07-14 DIAGNOSIS — Z3493 Encounter for supervision of normal pregnancy, unspecified, third trimester: Secondary | ICD-10-CM | POA: Diagnosis not present

## 2018-07-14 DIAGNOSIS — O1493 Unspecified pre-eclampsia, third trimester: Secondary | ICD-10-CM | POA: Diagnosis not present

## 2018-07-21 ENCOUNTER — Encounter (HOSPITAL_COMMUNITY): Payer: Self-pay | Admitting: *Deleted

## 2018-07-21 ENCOUNTER — Ambulatory Visit (HOSPITAL_BASED_OUTPATIENT_CLINIC_OR_DEPARTMENT_OTHER)
Admission: RE | Admit: 2018-07-21 | Discharge: 2018-07-21 | Disposition: A | Discharge status: Home / Self Care | Payer: BLUE CROSS/BLUE SHIELD | Source: Ambulatory Visit | Attending: Obstetrics and Gynecology | Admitting: Obstetrics and Gynecology

## 2018-07-21 ENCOUNTER — Encounter (HOSPITAL_COMMUNITY): Payer: Self-pay

## 2018-07-21 ENCOUNTER — Inpatient Hospital Stay (HOSPITAL_COMMUNITY)
Admission: AD | Admit: 2018-07-21 | Discharge: 2018-07-28 | DRG: 806 | Disposition: A | Discharge status: Home / Self Care | Payer: BLUE CROSS/BLUE SHIELD | Attending: Obstetrics & Gynecology | Admitting: Obstetrics & Gynecology

## 2018-07-21 ENCOUNTER — Other Ambulatory Visit: Payer: Self-pay

## 2018-07-21 DIAGNOSIS — O1413 Severe pre-eclampsia, third trimester: Secondary | ICD-10-CM | POA: Diagnosis not present

## 2018-07-21 DIAGNOSIS — O9872 Human immunodeficiency virus [HIV] disease complicating childbirth: Secondary | ICD-10-CM | POA: Diagnosis not present

## 2018-07-21 DIAGNOSIS — O1414 Severe pre-eclampsia complicating childbirth: Secondary | ICD-10-CM | POA: Diagnosis not present

## 2018-07-21 DIAGNOSIS — O42913 Preterm premature rupture of membranes, unspecified as to length of time between rupture and onset of labor, third trimester: Secondary | ICD-10-CM | POA: Diagnosis not present

## 2018-07-21 DIAGNOSIS — O99824 Streptococcus B carrier state complicating childbirth: Secondary | ICD-10-CM | POA: Diagnosis present

## 2018-07-21 DIAGNOSIS — O163 Unspecified maternal hypertension, third trimester: Secondary | ICD-10-CM | POA: Diagnosis not present

## 2018-07-21 DIAGNOSIS — O1493 Unspecified pre-eclampsia, third trimester: Secondary | ICD-10-CM | POA: Diagnosis not present

## 2018-07-21 DIAGNOSIS — O42919 Preterm premature rupture of membranes, unspecified as to length of time between rupture and onset of labor, unspecified trimester: Secondary | ICD-10-CM

## 2018-07-21 DIAGNOSIS — Z3A33 33 weeks gestation of pregnancy: Secondary | ICD-10-CM | POA: Diagnosis not present

## 2018-07-21 DIAGNOSIS — O99213 Obesity complicating pregnancy, third trimester: Secondary | ICD-10-CM

## 2018-07-21 DIAGNOSIS — D649 Anemia, unspecified: Secondary | ICD-10-CM | POA: Diagnosis present

## 2018-07-21 DIAGNOSIS — O9902 Anemia complicating childbirth: Secondary | ICD-10-CM | POA: Diagnosis present

## 2018-07-21 DIAGNOSIS — O9952 Diseases of the respiratory system complicating childbirth: Secondary | ICD-10-CM | POA: Diagnosis not present

## 2018-07-21 DIAGNOSIS — B20 Human immunodeficiency virus [HIV] disease: Secondary | ICD-10-CM | POA: Diagnosis present

## 2018-07-21 DIAGNOSIS — O4593 Premature separation of placenta, unspecified, third trimester: Secondary | ICD-10-CM | POA: Diagnosis not present

## 2018-07-21 DIAGNOSIS — O09893 Supervision of other high risk pregnancies, third trimester: Secondary | ICD-10-CM | POA: Insufficient documentation

## 2018-07-21 DIAGNOSIS — O4290 Premature rupture of membranes, unspecified as to length of time between rupture and onset of labor, unspecified weeks of gestation: Secondary | ICD-10-CM | POA: Diagnosis not present

## 2018-07-21 DIAGNOSIS — R030 Elevated blood-pressure reading, without diagnosis of hypertension: Secondary | ICD-10-CM | POA: Diagnosis not present

## 2018-07-21 DIAGNOSIS — Z3490 Encounter for supervision of normal pregnancy, unspecified, unspecified trimester: Secondary | ICD-10-CM

## 2018-07-21 DIAGNOSIS — J45909 Unspecified asthma, uncomplicated: Secondary | ICD-10-CM | POA: Diagnosis present

## 2018-07-21 DIAGNOSIS — Z789 Other specified health status: Secondary | ICD-10-CM | POA: Diagnosis present

## 2018-07-21 DIAGNOSIS — O4103X0 Oligohydramnios, third trimester, not applicable or unspecified: Secondary | ICD-10-CM | POA: Diagnosis present

## 2018-07-21 DIAGNOSIS — O411291 Chorioamnionitis, unspecified trimester, fetus 1: Secondary | ICD-10-CM | POA: Diagnosis not present

## 2018-07-21 DIAGNOSIS — O98713 Human immunodeficiency virus [HIV] disease complicating pregnancy, third trimester: Secondary | ICD-10-CM | POA: Diagnosis not present

## 2018-07-21 DIAGNOSIS — O30043 Twin pregnancy, dichorionic/diamniotic, third trimester: Secondary | ICD-10-CM | POA: Diagnosis not present

## 2018-07-21 DIAGNOSIS — O1494 Unspecified pre-eclampsia, complicating childbirth: Secondary | ICD-10-CM | POA: Diagnosis not present

## 2018-07-21 DIAGNOSIS — Z21 Asymptomatic human immunodeficiency virus [HIV] infection status: Secondary | ICD-10-CM | POA: Diagnosis not present

## 2018-07-21 DIAGNOSIS — Z3A32 32 weeks gestation of pregnancy: Secondary | ICD-10-CM

## 2018-07-21 DIAGNOSIS — O42113 Preterm premature rupture of membranes, onset of labor more than 24 hours following rupture, third trimester: Secondary | ICD-10-CM | POA: Diagnosis not present

## 2018-07-21 DIAGNOSIS — O169 Unspecified maternal hypertension, unspecified trimester: Secondary | ICD-10-CM | POA: Diagnosis present

## 2018-07-21 DIAGNOSIS — Z3A00 Weeks of gestation of pregnancy not specified: Secondary | ICD-10-CM | POA: Diagnosis not present

## 2018-07-21 DIAGNOSIS — O30049 Twin pregnancy, dichorionic/diamniotic, unspecified trimester: Secondary | ICD-10-CM | POA: Diagnosis not present

## 2018-07-21 DIAGNOSIS — E669 Obesity, unspecified: Secondary | ICD-10-CM

## 2018-07-21 HISTORY — DX: Cardiac murmur, unspecified: R01.1

## 2018-07-21 HISTORY — DX: Gestational (pregnancy-induced) hypertension without significant proteinuria, unspecified trimester: O13.9

## 2018-07-21 LAB — COMPREHENSIVE METABOLIC PANEL
ALK PHOS: 117 U/L (ref 38–126)
ALT: 15 U/L (ref 0–44)
AST: 22 U/L (ref 15–41)
Albumin: 2.7 g/dL — ABNORMAL LOW (ref 3.5–5.0)
Anion gap: 9 (ref 5–15)
BILIRUBIN TOTAL: 0.6 mg/dL (ref 0.3–1.2)
BUN: <5 mg/dL — ABNORMAL LOW (ref 6–20)
CALCIUM: 8.2 mg/dL — AB (ref 8.9–10.3)
CO2: 19 mmol/L — ABNORMAL LOW (ref 22–32)
CREATININE: 0.5 mg/dL (ref 0.44–1.00)
Chloride: 105 mmol/L (ref 98–111)
Glucose, Bld: 105 mg/dL — ABNORMAL HIGH (ref 70–99)
Potassium: 3.3 mmol/L — ABNORMAL LOW (ref 3.5–5.1)
Sodium: 133 mmol/L — ABNORMAL LOW (ref 135–145)
TOTAL PROTEIN: 6.5 g/dL (ref 6.5–8.1)

## 2018-07-21 LAB — TYPE AND SCREEN
ABO/RH(D): O POS
ANTIBODY SCREEN: NEGATIVE

## 2018-07-21 LAB — CBC
HEMATOCRIT: 31.1 % — AB (ref 36.0–46.0)
HEMOGLOBIN: 10.7 g/dL — AB (ref 12.0–15.0)
MCH: 30.7 pg (ref 26.0–34.0)
MCHC: 34.4 g/dL (ref 30.0–36.0)
MCV: 89.1 fL (ref 78.0–100.0)
PLATELETS: 211 10*3/uL (ref 150–400)
RBC: 3.49 MIL/uL — ABNORMAL LOW (ref 3.87–5.11)
RDW: 13.6 % (ref 11.5–15.5)
WBC: 9.6 10*3/uL (ref 4.0–10.5)

## 2018-07-21 LAB — PROTEIN / CREATININE RATIO, URINE
CREATININE, URINE: 155 mg/dL
Protein Creatinine Ratio: 0.5 mg/mg{Cre} — ABNORMAL HIGH (ref 0.00–0.15)
Total Protein, Urine: 77 mg/dL

## 2018-07-21 LAB — ABO/RH: ABO/RH(D): O POS

## 2018-07-21 LAB — LACTATE DEHYDROGENASE: LDH: 166 U/L (ref 98–192)

## 2018-07-21 LAB — URIC ACID: Uric Acid, Serum: 3.8 mg/dL (ref 2.5–7.1)

## 2018-07-21 LAB — GROUP B STREP BY PCR: Group B strep by PCR: POSITIVE — AB

## 2018-07-21 MED ORDER — RALTEGRAVIR POTASSIUM 400 MG PO TABS
400.0000 mg | ORAL_TABLET | Freq: Two times a day (BID) | ORAL | Status: DC
  Administered 2018-07-21 – 2018-07-27 (×13): 400 mg via ORAL
  Filled 2018-07-21 (×14): qty 1

## 2018-07-21 MED ORDER — DOCUSATE SODIUM 100 MG PO CAPS
100.0000 mg | ORAL_CAPSULE | Freq: Every day | ORAL | Status: DC
Start: 2018-07-21 — End: 2018-07-26
  Administered 2018-07-21 – 2018-07-26 (×4): 100 mg via ORAL
  Filled 2018-07-21 (×5): qty 1

## 2018-07-21 MED ORDER — SODIUM CHLORIDE 0.9% FLUSH: 3.0000 mL | INTRAVENOUS | Status: DC | PRN

## 2018-07-21 MED ORDER — ACETAMINOPHEN 325 MG PO TABS: 650.0000 mg | ORAL_TABLET | ORAL | Status: DC | PRN

## 2018-07-21 MED ORDER — CALCIUM CARBONATE ANTACID 500 MG PO CHEW
2.0000 | CHEWABLE_TABLET | ORAL | Status: DC | PRN
  Administered 2018-07-22: 400 mg via ORAL
  Filled 2018-07-21: qty 2

## 2018-07-21 MED ORDER — ALBUTEROL SULFATE (2.5 MG/3ML) 0.083% IN NEBU: 3.0000 mL | INHALATION_SOLUTION | RESPIRATORY_TRACT | Status: DC | PRN

## 2018-07-21 MED ORDER — HYDRALAZINE HCL 20 MG/ML IJ SOLN: 10.0000 mg | INTRAMUSCULAR | Status: DC | PRN

## 2018-07-21 MED ORDER — ZOLPIDEM TARTRATE 5 MG PO TABS: 5.0000 mg | ORAL_TABLET | Freq: Every evening | ORAL | Status: DC | PRN

## 2018-07-21 MED ORDER — SODIUM CHLORIDE 0.9 % IV SOLN
250.0000 mL | INTRAVENOUS | Status: DC | PRN
  Administered 2018-07-24: 250 mL via INTRAVENOUS
  Administered 2018-07-26: 10 mL via INTRAVENOUS

## 2018-07-21 MED ORDER — LABETALOL HCL 5 MG/ML IV SOLN
20.0000 mg | INTRAVENOUS | Status: DC | PRN
  Administered 2018-07-22: 20 mg via INTRAVENOUS
  Filled 2018-07-21: qty 4

## 2018-07-21 MED ORDER — LABETALOL HCL 5 MG/ML IV SOLN
40.0000 mg | INTRAVENOUS | Status: DC | PRN
  Administered 2018-07-21: 40 mg via INTRAVENOUS
  Filled 2018-07-21: qty 8

## 2018-07-21 MED ORDER — PRENATAL MULTIVITAMIN CH
1.0000 | ORAL_TABLET | Freq: Every day | ORAL | Status: DC
  Administered 2018-07-22 – 2018-07-26 (×5): 1 via ORAL
  Filled 2018-07-21 (×5): qty 1

## 2018-07-21 MED ORDER — LABETALOL HCL 5 MG/ML IV SOLN: 40.0000 mg | INTRAVENOUS | Status: DC | PRN

## 2018-07-21 MED ORDER — LABETALOL HCL 5 MG/ML IV SOLN: 80.0000 mg | INTRAVENOUS | Status: DC | PRN

## 2018-07-21 MED ORDER — LABETALOL HCL 5 MG/ML IV SOLN
20.0000 mg | INTRAVENOUS | Status: DC | PRN
  Administered 2018-07-21: 20 mg via INTRAVENOUS
  Filled 2018-07-21: qty 4

## 2018-07-21 MED ORDER — LAMIVUDINE 150 MG PO TABS
300.0000 mg | ORAL_TABLET | Freq: Every day | ORAL | Status: DC
  Administered 2018-07-22 – 2018-07-27 (×6): 300 mg via ORAL
  Filled 2018-07-21 (×7): qty 2

## 2018-07-21 MED ORDER — LAMIVUDINE 150 MG PO TABS
300.0000 mg | ORAL_TABLET | Freq: Every day | ORAL | Status: DC
  Filled 2018-07-21: qty 2

## 2018-07-21 MED ORDER — SODIUM CHLORIDE 0.9% FLUSH
3.0000 mL | Freq: Two times a day (BID) | INTRAVENOUS | Status: DC
  Administered 2018-07-21 – 2018-07-26 (×8): 3 mL via INTRAVENOUS

## 2018-07-21 MED ORDER — ABACAVIR SULFATE 300 MG PO TABS
600.0000 mg | ORAL_TABLET | Freq: Every day | ORAL | Status: DC
  Administered 2018-07-21 – 2018-07-27 (×7): 600 mg via ORAL
  Filled 2018-07-21 (×7): qty 2

## 2018-07-21 MED ORDER — LACTATED RINGERS IV SOLN
INTRAVENOUS | Status: DC
  Administered 2018-07-21 – 2018-07-22 (×3): via INTRAVENOUS

## 2018-07-21 NOTE — MAU Note (Signed)
BP has been going up the past wk. Slight headache(started with crying), denies visual changes, ? Pain in RUQ, (on US that is where the baby is), slight increase in swelling in feet.

## 2018-07-21 NOTE — H&P (Signed)
History    Patient is a 32 y.o. G1P0 at 616w2d with di/di twins.     Chief Complaint  Patient presents with  . Hypertension  . Headache   Patient was seen for ultrasound today by MFM, who noted her pressures to be elevated in the severe range and sent her to MAU. Patient is being monitored by MFM as she is HIV+ with a di/di twin pregnancy. Patient feels well. She does report having a mild headache earlier today which has disappeared. She is complaining of constant dull right upper quadrant pain but believes that is related to the position of one of the babies.    OB History    Gravida  1   Para      Term      Preterm      AB      Living  0     SAB      TAB      Ectopic      Multiple      Live Births              Past Medical History:  Diagnosis Date  . Anxiety   . Asthma   . Dysmenorrhea    H/O  . Heart murmur   . History of IBS   . HIV infection (HCC)   . HSV infection   . Plantar fasciitis   . Pregnancy induced hypertension   . Trichomonas infection     Past Surgical History:  Procedure Laterality Date  . WISDOM TOOTH EXTRACTION      Family History  Problem Relation Age of Onset  . Cancer Father        BRAIN  . Asthma Father   . Diabetes Father   . Thyroid disease Mother   . Migraines Mother      Social History   Tobacco Use  . Smoking status: Never Smoker  . Smokeless tobacco: Never Used  Substance Use Topics  . Alcohol use: Yes    Comment: SOCIAL  . Drug use: No     Maternal Diabetes: No Genetic Screening: Normal Maternal Ultrasounds/Referrals: Normal Fetal Ultrasounds or other Referrals:  Referred to Materal Fetal Medicine for HIV+ and di/di twins  Maternal Substance Abuse:  No Significant Maternal Medications:  None Significant Maternal Lab Results:  Lab values include: HIV positive Other Comments:  None  Allergies: No Known Allergies  Medications Prior to Admission  Medication Sig Dispense Refill Last Dose  .  ABACAVIR SULFATE PO Take by mouth.   Taking  . abacavir-dolutegravir-lamiVUDine (TRIUMEQ) 600-50-300 MG tablet Take by mouth.   Taking  . albuterol (PROVENTIL HFA;VENTOLIN HFA) 108 (90 Base) MCG/ACT inhaler Inhale 2 puffs into the lungs every 4 (four) hours as needed. 1 Inhaler 0 Taking  . Chlorpheniramine Maleate (ALLERGY RELIEF PO) Take by mouth.   Taking  . citalopram (CELEXA) 20 MG tablet TAKE 1 TABLET BY MOUTH EVERYDAY AT BEDTIME (Patient not taking: Reported on 06/23/2018) 90 tablet 1 Not Taking  . IRON PO Take by mouth.   Taking  . LAMIVUDINE PO Take by mouth.   Taking  . Prenatal Vit-Fe Fumarate-FA (PRENATAL VITAMIN PO) Take by mouth.   Taking  . Raltegravir Potassium (ISENTRESS PO) Take by mouth.   Taking    Review of Systems  Eyes: Negative for visual disturbance.  Gastrointestinal: Positive for abdominal pain.  Neurological: Negative for headaches.  All other systems reviewed and are negative.  Physical Exam   Vitals:  07/21/18 1733 07/21/18 1801 07/21/18 1816 07/21/18 1830  BP: (!) 162/105 (!) 172/110 (!) 157/106 (!) 147/111  Pulse: 82 97 88 99  Resp: 18     Temp: 98.3 F (36.8 C)     TempSrc: Oral     SpO2: 100%      Results for orders placed or performed during the hospital encounter of 07/21/18 (from the past 24 hour(s))  CBC     Status: Abnormal   Collection Time: 07/21/18  6:03 PM  Result Value Ref Range   WBC 9.6 4.0 - 10.5 K/uL   RBC 3.49 (L) 3.87 - 5.11 MIL/uL   Hemoglobin 10.7 (L) 12.0 - 15.0 g/dL   HCT 16.131.1 (L) 09.636.0 - 04.546.0 %   MCV 89.1 78.0 - 100.0 fL   MCH 30.7 26.0 - 34.0 pg   MCHC 34.4 30.0 - 36.0 g/dL   RDW 40.913.6 81.111.5 - 91.415.5 %   Platelets 211 150 - 400 K/uL    Physical Exam  Nursing note and vitals reviewed. Constitutional: She is oriented to person, place, and time. She appears well-developed and well-nourished.  HENT:  Head: Normocephalic.  Eyes: Pupils are equal, round, and reactive to light.  Cardiovascular: Normal rate, regular rhythm  and normal heart sounds.  Respiratory: Effort normal and breath sounds normal.  GI: There is no tenderness.  Musculoskeletal: Normal range of motion.  Neurological: She is alert and oriented to person, place, and time.  Skin: Skin is warm and dry.  Psychiatric: She has a normal mood and affect. Her behavior is normal. Judgment and thought content normal.   Family History: family history includes Asthma in her father; Cancer in her father; Diabetes in her father; Migraines in her mother; Thyroid disease in her mother. Social History:  reports that she has never smoked. She has never used smokeless tobacco. She reports that she drinks alcohol. She reports that she does not use drugs.     Fetal Exam Fetal Monitor Review: Mode: ultrasound.   Baseline rate: 145.  Variability: moderate (6-25 bpm).   Pattern: variable decelerations.    Fetal State Assessment: Category I - tracings are normal.     Prenatal labs: ABO, Rh:  O+ Antibody:  NR Rubella:  Nonimmune RPR:   NR HBsAg:   NR HIV:   Positive, since 2009, on antiretrovirals with undetectable viral load  GBS:   Pending   Assessment/Plan: 32 y.o. G1P0 at 935w2d with di/di twins HIV positive Gestational hypertension vs. Preeclampsia PIH labs pending  Admit patient for observation   Janeece Riggersllis K Eiko Mcgowen 07/21/2018, 6:57 PM

## 2018-07-21 NOTE — Progress Notes (Signed)
Results for orders placed or performed during the hospital encounter of 07/21/18 (from the past 24 hour(s))  Protein / creatinine ratio, urine     Status: Abnormal   Collection Time: 07/21/18  5:57 PM  Result Value Ref Range   Creatinine, Urine 155.00 mg/dL   Total Protein, Urine 77 mg/dL   Protein Creatinine Ratio 0.50 (H) 0.00 - 0.15 mg/mg[Cre]  CBC     Status: Abnormal   Collection Time: 07/21/18  6:03 PM  Result Value Ref Range   WBC 9.6 4.0 - 10.5 K/uL   RBC 3.49 (L) 3.87 - 5.11 MIL/uL   Hemoglobin 10.7 (L) 12.0 - 15.0 g/dL   HCT 21.331.1 (L) 08.636.0 - 57.846.0 %   MCV 89.1 78.0 - 100.0 fL   MCH 30.7 26.0 - 34.0 pg   MCHC 34.4 30.0 - 36.0 g/dL   RDW 46.913.6 62.911.5 - 52.815.5 %   Platelets 211 150 - 400 K/uL  Comprehensive metabolic panel     Status: Abnormal   Collection Time: 07/21/18  6:03 PM  Result Value Ref Range   Sodium 133 (L) 135 - 145 mmol/L   Potassium 3.3 (L) 3.5 - 5.1 mmol/L   Chloride 105 98 - 111 mmol/L   CO2 19 (L) 22 - 32 mmol/L   Glucose, Bld 105 (H) 70 - 99 mg/dL   BUN <5 (L) 6 - 20 mg/dL   Creatinine, Ser 4.130.50 0.44 - 1.00 mg/dL   Calcium 8.2 (L) 8.9 - 10.3 mg/dL   Total Protein 6.5 6.5 - 8.1 g/dL   Albumin 2.7 (L) 3.5 - 5.0 g/dL   AST 22 15 - 41 U/L   ALT 15 0 - 44 U/L   Alkaline Phosphatase 117 38 - 126 U/L   Total Bilirubin 0.6 0.3 - 1.2 mg/dL   GFR calc non Af Amer >60 >60 mL/min   GFR calc Af Amer >60 >60 mL/min   Anion gap 9 5 - 15  Uric acid     Status: None   Collection Time: 07/21/18  6:03 PM  Result Value Ref Range   Uric Acid, Serum 3.8 2.5 - 7.1 mg/dL  Lactate dehydrogenase     Status: None   Collection Time: 07/21/18  6:03 PM  Result Value Ref Range   LDH 166 98 - 192 U/L  Type and screen [facility]     Status: None   Collection Time: 07/21/18  6:03 PM  Result Value Ref Range   ABO/RH(D) O POS    Antibody Screen NEG    Sample Expiration      07/24/2018 Performed at [facility], [address] [place], [place] [address]

## 2018-07-21 NOTE — Progress Notes (Signed)
Report called to L. Leland HerWestin, Charity fundraiserN.  Pt ambulated and signed into MAU for further evaluation per [facility].

## 2018-07-22 ENCOUNTER — Ambulatory Visit (HOSPITAL_COMMUNITY): Payer: BLUE CROSS/BLUE SHIELD

## 2018-07-22 DIAGNOSIS — O09893 Supervision of other high risk pregnancies, third trimester: Secondary | ICD-10-CM | POA: Diagnosis not present

## 2018-07-22 DIAGNOSIS — O411291 Chorioamnionitis, unspecified trimester, fetus 1: Secondary | ICD-10-CM | POA: Diagnosis not present

## 2018-07-22 DIAGNOSIS — O4290 Premature rupture of membranes, unspecified as to length of time between rupture and onset of labor, unspecified weeks of gestation: Secondary | ICD-10-CM | POA: Diagnosis not present

## 2018-07-22 DIAGNOSIS — O1493 Unspecified pre-eclampsia, third trimester: Secondary | ICD-10-CM | POA: Diagnosis not present

## 2018-07-22 DIAGNOSIS — Z3A00 Weeks of gestation of pregnancy not specified: Secondary | ICD-10-CM | POA: Diagnosis not present

## 2018-07-22 DIAGNOSIS — R030 Elevated blood-pressure reading, without diagnosis of hypertension: Secondary | ICD-10-CM | POA: Diagnosis present

## 2018-07-22 DIAGNOSIS — O1413 Severe pre-eclampsia, third trimester: Secondary | ICD-10-CM | POA: Diagnosis not present

## 2018-07-22 DIAGNOSIS — Z21 Asymptomatic human immunodeficiency virus [HIV] infection status: Secondary | ICD-10-CM | POA: Diagnosis present

## 2018-07-22 DIAGNOSIS — O1494 Unspecified pre-eclampsia, complicating childbirth: Secondary | ICD-10-CM | POA: Diagnosis not present

## 2018-07-22 DIAGNOSIS — O30043 Twin pregnancy, dichorionic/diamniotic, third trimester: Secondary | ICD-10-CM | POA: Diagnosis not present

## 2018-07-22 DIAGNOSIS — O30049 Twin pregnancy, dichorionic/diamniotic, unspecified trimester: Secondary | ICD-10-CM | POA: Diagnosis not present

## 2018-07-22 DIAGNOSIS — J45909 Unspecified asthma, uncomplicated: Secondary | ICD-10-CM | POA: Diagnosis present

## 2018-07-22 DIAGNOSIS — D649 Anemia, unspecified: Secondary | ICD-10-CM | POA: Diagnosis present

## 2018-07-22 DIAGNOSIS — O99213 Obesity complicating pregnancy, third trimester: Secondary | ICD-10-CM | POA: Diagnosis not present

## 2018-07-22 DIAGNOSIS — Z3A33 33 weeks gestation of pregnancy: Secondary | ICD-10-CM | POA: Diagnosis not present

## 2018-07-22 DIAGNOSIS — O4593 Premature separation of placenta, unspecified, third trimester: Secondary | ICD-10-CM | POA: Diagnosis present

## 2018-07-22 DIAGNOSIS — O9872 Human immunodeficiency virus [HIV] disease complicating childbirth: Secondary | ICD-10-CM | POA: Diagnosis present

## 2018-07-22 DIAGNOSIS — O9902 Anemia complicating childbirth: Secondary | ICD-10-CM | POA: Diagnosis present

## 2018-07-22 DIAGNOSIS — O42113 Preterm premature rupture of membranes, onset of labor more than 24 hours following rupture, third trimester: Secondary | ICD-10-CM | POA: Diagnosis not present

## 2018-07-22 DIAGNOSIS — O4103X0 Oligohydramnios, third trimester, not applicable or unspecified: Secondary | ICD-10-CM | POA: Diagnosis present

## 2018-07-22 DIAGNOSIS — O163 Unspecified maternal hypertension, third trimester: Secondary | ICD-10-CM | POA: Diagnosis not present

## 2018-07-22 DIAGNOSIS — Z3A32 32 weeks gestation of pregnancy: Secondary | ICD-10-CM | POA: Diagnosis not present

## 2018-07-22 DIAGNOSIS — O98713 Human immunodeficiency virus [HIV] disease complicating pregnancy, third trimester: Secondary | ICD-10-CM | POA: Diagnosis not present

## 2018-07-22 DIAGNOSIS — O99824 Streptococcus B carrier state complicating childbirth: Secondary | ICD-10-CM | POA: Diagnosis present

## 2018-07-22 DIAGNOSIS — O9952 Diseases of the respiratory system complicating childbirth: Secondary | ICD-10-CM | POA: Diagnosis present

## 2018-07-22 DIAGNOSIS — O42913 Preterm premature rupture of membranes, unspecified as to length of time between rupture and onset of labor, third trimester: Secondary | ICD-10-CM | POA: Diagnosis present

## 2018-07-22 DIAGNOSIS — O1414 Severe pre-eclampsia complicating childbirth: Secondary | ICD-10-CM | POA: Diagnosis present

## 2018-07-22 LAB — COMPREHENSIVE METABOLIC PANEL
ALT: 12 U/L (ref 0–44)
AST: 18 U/L (ref 15–41)
Albumin: 2.4 g/dL — ABNORMAL LOW (ref 3.5–5.0)
Alkaline Phosphatase: 93 U/L (ref 38–126)
Anion gap: 9 (ref 5–15)
BUN: <5 mg/dL — ABNORMAL LOW (ref 6–20)
CO2: 19 mmol/L — ABNORMAL LOW (ref 22–32)
Calcium: 7.9 mg/dL — ABNORMAL LOW (ref 8.9–10.3)
Chloride: 108 mmol/L (ref 98–111)
Creatinine, Ser: 0.45 mg/dL (ref 0.44–1.00)
GFR calc Af Amer: >60 mL/min (ref >60)
GFR calc non Af Amer: >60 mL/min (ref >60)
Glucose, Bld: 86 mg/dL (ref 70–99)
Potassium: 2.8 mmol/L — ABNORMAL LOW (ref 3.5–5.1)
Sodium: 136 mmol/L (ref 135–145)
Total Bilirubin: 0.5 mg/dL (ref 0.3–1.2)
Total Protein: 5.8 g/dL — ABNORMAL LOW (ref 6.5–8.1)

## 2018-07-22 LAB — CBC
HCT: 26.4 % — ABNORMAL LOW (ref 36.0–46.0)
Hemoglobin: 9.3 g/dL — ABNORMAL LOW (ref 12.0–15.0)
MCH: 31.4 pg (ref 26.0–34.0)
MCHC: 35.2 g/dL (ref 30.0–36.0)
MCV: 89.2 fL (ref 78.0–100.0)
Platelets: 179 10*3/uL (ref 150–400)
RBC: 2.96 MIL/uL — ABNORMAL LOW (ref 3.87–5.11)
RDW: 13.6 % (ref 11.5–15.5)
WBC: 9.8 10*3/uL (ref 4.0–10.5)

## 2018-07-22 MED ORDER — VALACYCLOVIR HCL 500 MG PO TABS
500.0000 mg | ORAL_TABLET | Freq: Two times a day (BID) | ORAL | Status: DC
  Administered 2018-07-22 – 2018-07-26 (×8): 500 mg via ORAL
  Filled 2018-07-22 (×10): qty 1

## 2018-07-22 MED ORDER — BETAMETHASONE SOD PHOS & ACET 6 (3-3) MG/ML IJ SUSP
12.0000 mg | INTRAMUSCULAR | Status: AC
  Administered 2018-07-22 – 2018-07-23 (×2): 12 mg via INTRAMUSCULAR
  Filled 2018-07-22 (×2): qty 2

## 2018-07-22 MED ORDER — NIFEDIPINE ER OSMOTIC RELEASE 30 MG PO TB24
30.0000 mg | ORAL_TABLET | Freq: Every day | ORAL | Status: DC
  Administered 2018-07-22 – 2018-07-26 (×5): 30 mg via ORAL
  Filled 2018-07-22 (×5): qty 1

## 2018-07-22 MED ORDER — POTASSIUM CHLORIDE CRYS ER 20 MEQ PO TBCR
40.0000 meq | EXTENDED_RELEASE_TABLET | Freq: Two times a day (BID) | ORAL | Status: DC
  Administered 2018-07-22 – 2018-07-23 (×3): 40 meq via ORAL
  Filled 2018-07-22 (×4): qty 2

## 2018-07-22 NOTE — Progress Notes (Addendum)
Hospital day # 0 Barbara Marshall 32 y.o. G1P0 pregnancy at 2652w3d with Mercie Eoni di Twins, pregnancy complicated by HIV+ and Preeclampsia. Discussed plan with Dr. Redmond Basemanillard  S: well, reports good fetal activity     Denies headache, epigastric pain, blurry vision      Contractions:none      Vaginal bleeding:none        Vaginal discharge: no significant change  O: BP (!) 145/104 (BP Location: Left Arm)   Pulse 93   Temp 97.7 F (36.5 C)   Resp 17   LMP 12/07/2017 (Exact Date)   SpO2 100%   Results for orders placed or performed during the hospital encounter of 07/21/18 (from the past 24 hour(s))  Protein / creatinine ratio, urine     Status: Abnormal   Collection Time: 07/21/18  5:57 PM  Result Value Ref Range   Creatinine, Urine 155.00 mg/dL   Total Protein, Urine 77 mg/dL   Protein Creatinine Ratio 0.50 (H) 0.00 - 0.15 mg/mg[Cre]  CBC     Status: Abnormal   Collection Time: 07/21/18  6:03 PM  Result Value Ref Range   WBC 9.6 4.0 - 10.5 K/uL   RBC 3.49 (L) 3.87 - 5.11 MIL/uL   Hemoglobin 10.7 (L) 12.0 - 15.0 g/dL   HCT 19.131.1 (L) 47.836.0 - 29.546.0 %   MCV 89.1 78.0 - 100.0 fL   MCH 30.7 26.0 - 34.0 pg   MCHC 34.4 30.0 - 36.0 g/dL   RDW 62.113.6 30.811.5 - 65.715.5 %   Platelets 211 150 - 400 K/uL  Comprehensive metabolic panel     Status: Abnormal   Collection Time: 07/21/18  6:03 PM  Result Value Ref Range   Sodium 133 (L) 135 - 145 mmol/L   Potassium 3.3 (L) 3.5 - 5.1 mmol/L   Chloride 105 98 - 111 mmol/L   CO2 19 (L) 22 - 32 mmol/L   Glucose, Bld 105 (H) 70 - 99 mg/dL   BUN <5 (L) 6 - 20 mg/dL   Creatinine, Ser 8.460.50 0.44 - 1.00 mg/dL   Calcium 8.2 (L) 8.9 - 10.3 mg/dL   Total Protein 6.5 6.5 - 8.1 g/dL   Albumin 2.7 (L) 3.5 - 5.0 g/dL   AST 22 15 - 41 U/L   ALT 15 0 - 44 U/L   Alkaline Phosphatase 117 38 - 126 U/L   Total Bilirubin 0.6 0.3 - 1.2 mg/dL   GFR calc non Af Amer >60 >60 mL/min   GFR calc Af Amer >60 >60 mL/min   Anion gap 9 5 - 15  Uric acid     Status: None   Collection  Time: 07/21/18  6:03 PM  Result Value Ref Range   Uric Acid, Serum 3.8 2.5 - 7.1 mg/dL  Lactate dehydrogenase     Status: None   Collection Time: 07/21/18  6:03 PM  Result Value Ref Range   LDH 166 98 - 192 U/L  Type and screen [facility] [place]     Status: None   Collection Time: 07/21/18  6:03 PM  Result Value Ref Range   ABO/RH(D) O POS    Antibody Screen NEG    Sample Expiration      07/24/2018 Performed at [facility], [address] [place], [place] [address]   ABO/Rh     Status: None   Collection Time: 07/21/18  6:03 PM  Result Value Ref Range   ABO/RH(D)      O POS Performed at [facility], [address] [place], [place] [address]   Group B strep by PCR     Status: Abnormal   Collection Time: 07/21/18  8:58 PM  Result Value Ref Range   Group B strep by PCR POSITIVE (A) NEGATIVE  CBC     Status: Abnormal   Collection Time: 07/22/18  7:18 AM  Result Value Ref Range   WBC 9.8 4.0 - 10.5 K/uL   RBC 2.96 (L) 3.87 - 5.11 MIL/uL   Hemoglobin 9.3 (L) 12.0 - 15.0 g/dL   HCT 60.426.4 (L) 54.036.0 - 98.146.0 %   MCV 89.2 78.0 - 100.0 fL   MCH 31.4 26.0 - 34.0 pg   MCHC 35.2 30.0 - 36.0 g/dL   RDW 19.113.6 47.811.5 - 29.515.5 %   Platelets 179 150 - 400 K/uL  Comprehensive metabolic panel     Status: Abnormal   Collection Time: 07/22/18  7:18 AM  Result Value Ref Range   Sodium 136 135 - 145 mmol/L   Potassium 2.8 (L) 3.5 - 5.1 mmol/L   Chloride 108 98 - 111 mmol/L   CO2 19 (L) 22 - 32 mmol/L   Glucose, Bld 86 70 - 99 mg/dL   BUN <5 (L) 6 - 20 mg/dL   Creatinine, Ser 6.210.45 0.44 - 1.00 mg/dL   Calcium 7.9 (L) 8.9 - 10.3 mg/dL   Total Protein 5.8 (L) 6.5 - 8.1 g/dL   Albumin 2.4 (L) 3.5 - 5.0 g/dL   AST 18 15 - 41 U/L   ALT 12 0 - 44 U/L   Alkaline Phosphatase 93 38 - 126 U/L   Total Bilirubin 0.5 0.3 - 1.2 mg/dL   GFR calc non Af Amer >60 >60 mL/min   GFR calc Af Amer >60 >60 mL/min   Anion gap 9 5 - 15        Fetal tracings:reviewed and reassuring Baby A and  Baby B reassuring      Uterus gravid and non-tender      Extremities: extremities normal, atraumatic, no cyanosis or edema and no significant edema and no signs of DVT  A: 7058w3d with Preeclamspia     stable  P: Admit to Antenatal       Betamethasone 12mg  q 24 hours x 2 doses      Procardia 30 XL q day   This am:    HIV Viral Load        CBC         CMP   Lori A Clemmons  CNM 07/22/2018 9:51 AM  Pt seen and examined.  She was told the plan.  If able to control BP she may go home with modified BR and close fu

## 2018-07-23 ENCOUNTER — Inpatient Hospital Stay (HOSPITAL_COMMUNITY): Payer: BLUE CROSS/BLUE SHIELD

## 2018-07-23 DIAGNOSIS — O1493 Unspecified pre-eclampsia, third trimester: Secondary | ICD-10-CM

## 2018-07-23 DIAGNOSIS — O163 Unspecified maternal hypertension, third trimester: Secondary | ICD-10-CM

## 2018-07-23 DIAGNOSIS — O09893 Supervision of other high risk pregnancies, third trimester: Secondary | ICD-10-CM

## 2018-07-23 LAB — COMPREHENSIVE METABOLIC PANEL
ALT: 13 U/L (ref 0–44)
AST: 20 U/L (ref 15–41)
Albumin: 2.6 g/dL — ABNORMAL LOW (ref 3.5–5.0)
Alkaline Phosphatase: 97 U/L (ref 38–126)
Anion gap: 10 (ref 5–15)
BUN: <5 mg/dL — ABNORMAL LOW (ref 6–20)
CO2: 17 mmol/L — ABNORMAL LOW (ref 22–32)
Calcium: 8.5 mg/dL — ABNORMAL LOW (ref 8.9–10.3)
Chloride: 108 mmol/L (ref 98–111)
Creatinine, Ser: 0.56 mg/dL (ref 0.44–1.00)
GFR calc Af Amer: >60 mL/min (ref >60)
GFR calc non Af Amer: >60 mL/min (ref >60)
Glucose, Bld: 136 mg/dL — ABNORMAL HIGH (ref 70–99)
Potassium: 3.5 mmol/L (ref 3.5–5.1)
Sodium: 135 mmol/L (ref 135–145)
Total Bilirubin: 0.2 mg/dL — ABNORMAL LOW (ref 0.3–1.2)
Total Protein: 6.2 g/dL — ABNORMAL LOW (ref 6.5–8.1)

## 2018-07-23 LAB — CBC
HCT: 27 % — ABNORMAL LOW (ref 36.0–46.0)
Hemoglobin: 8.9 g/dL — ABNORMAL LOW (ref 12.0–15.0)
MCH: 30.1 pg (ref 26.0–34.0)
MCHC: 33 g/dL (ref 30.0–36.0)
MCV: 91.2 fL (ref 78.0–100.0)
Platelets: 206 10*3/uL (ref 150–400)
RBC: 2.96 MIL/uL — ABNORMAL LOW (ref 3.87–5.11)
RDW: 14 % (ref 11.5–15.5)
WBC: 10.7 10*3/uL — ABNORMAL HIGH (ref 4.0–10.5)

## 2018-07-23 LAB — PROTEIN / CREATININE RATIO, URINE
Creatinine, Urine: 40 mg/dL
Protein Creatinine Ratio: 0.28 mg/mg{Cre} — ABNORMAL HIGH (ref 0.00–0.15)
Total Protein, Urine: 11 mg/dL

## 2018-07-23 LAB — T-HELPER CELLS (CD4) COUNT (NOT AT ARMC)
CD4 % Helper T Cell: 39 % (ref 33–55)
CD4 T Cell Abs: 660 /uL (ref 400–2700)

## 2018-07-23 LAB — PROTEIN, URINE, 24 HOUR
Collection Interval-UPROT: 24 hours
PROTEIN, 24H URINE: 390 mg/d — AB (ref 50–100)
Protein, Urine: 20 mg/dL
URINE TOTAL VOLUME-UPROT: 1950 mL

## 2018-07-23 NOTE — Progress Notes (Deleted)
Barbara Marshall Encounter Summary, generated on Aug. 29, 2018August 29, 2018 Printout Information  Document Contents Document Received Date Document Source Organization  Lab Visit Aug. 29, 2018August 29, 2018 [facility]  Patient Demographics - 32 y.o. Female; born [birth year]   Patient Address Communication Language Race / Ethnicity Marital Status  [address] [place], [place] [address] [phone] (Home) [phone] [email or web address] Unknown Unknown / Unknown Unknown  Encounter Details   Date Type Department Care Team Description  08/03/2017 Lab Visit [facility]  [address]  [place], [place] [address]  [phone]  Vanessa DurhamSchafer, Katherine Rachel, MD  [address]  [place], [place] [address]  [phone]  [phone] (Fax)  Human immunodeficiency virus (HIV) disease [facility](HCC)  Social History - as of this encounter  Tobacco Use Types Packs/Day Years Used Date  Never Smoker   0   Smokeless Tobacco: Never Used      Alcohol Use Drinks/Week oz/Week Comments  Yes   1-2 times a month   Sex Assigned at [employer] [facility] Date Recorded  Not on file   Discharge Disposition - in this encounter  Disposition Code Departure Means Destination  Home or Self Care     Plan of Treatment - as of this encounter  Upcoming Encounters Upcoming Encounters  Date Type Specialty Care Team Description  02/15/2018 Office Visit Infectious Diseases Vanessa DurhamSchafer, Katherine Rachel, MD  [address]  [place], [place] [address]  [phone]  [phone] (Fax)     Pending Results Pending Results  Name Priority Associated Diagnoses Date/Time  QuantiFERON In-tube (Sendout) Routine Human immunodeficiency virus (HIV) disease (HCC)  08/03/2017 5:21 PM EDT  Procedures - in this encounter  Procedure Name Priority Date/Time Associated Diagnosis Comments  HIV,ULTRAQUANT Routine 08/03/2017 5:21 PM EDT Human  immunodeficiency virus (HIV) disease (HCC)  Results for this procedure are in the results section.   CBC WITH AUTO DIFFERENTIAL PANEL Routine 08/03/2017 5:21 PM EDT Human immunodeficiency virus (HIV) disease (HCC)  Results for this procedure are in the results section.   T-HELPER CELLS (CD4) COUNT Routine 08/03/2017 5:21 PM EDT Human immunodeficiency virus (HIV) disease (HCC)  Results for this procedure are in the results section.   CBC AND DIFFERENTIAL Routine 08/03/2017 5:21 PM EDT Human immunodeficiency virus (HIV) disease (HCC)  Results for this procedure are in the results section.   Lab Results - in this encounter  Table of Contents for Lab Results  CBC and Differential  CBC And Differential  T-Helper Cells (Cd4) Count  HIV,Ultraquant     CBC and Differential CBC and Differential  Component Value Ref Range Performed At  WBC 10.9 (H) 4.8 - 10.8 x 10*3/uL [place] [facility]  RBC 4.14 (L) 4.20 - 5.40 x 10*6/uL [place] [facility]  Hemoglobin 13.5 12.0 - 16.0 G/DL [place] [facility]  Hematocrit 38.7 37.0 - 47.0 % [place] [facility]  MCV 93.3 81.0 - 99.0 FL [place] [facility]  MCH 32.5 (H) 27.0 - 31.0 PG [place] [facility]  MCHC 34.8 33.0 - 37.0 G/DL [place] [facility]  RDW 11.9 11.5 - 14.5 % [place] [facility]  Platelets 295 160 - 360 X 10*3/uL [place] [facility]  MPV 7.5 6.8 - 10.2 FL [place] [facility]  Neutrophil % 68 %  [facility]  Lymphocyte % 25 % [place] [facility]  Monocyte % 5 3 - 12 % [place] [facility]  Eosinophil % 3 % [place] [facility]  Basophil % 0 % [place] [facility]  Neutrophil Absolute 7.3 1.6 - 7.3 x 10*3/uL [place] [facility]  Lymphocyte Absolute 2.7 1.0 - 5.1 x  10*3/uL [place] [facility]  Monocyte Absolute 0.5 0.1 - 0.9 x 10*3/uL [place] [facility]  Eosinophil Absolute 0.3 0.0 - 0.5 x 10*3/uL [place] [facility]  Basophil Absolute 0.0 0.0 - 0.2 x 10*3/uL [place] [facility]  NRBC Manual 0 / 100 WBC [place] [facility]   CBC and Differential  Specimen  Blood   CBC and Differential  Performing Organization Address City/State/Zipcode Phone Number  [place] [facility]  CLIA# [id number], [address]  [place], [place] [address]    Back to top of Lab Results    CBC And Differential CBC And Differential  Specimen  Blood   CBC And Differential  Narrative Performed At  The following orders were created for panel order CBC And Differential.  Procedure Abnormality Status   --------- ----------- ------   CBC and Differential[[id number]] AbnormalFinal result     Please view results for these tests on the individual orders.     Back to top of Lab Results    T-Helper Cells (Cd4) Count T-Helper Cells (Cd4) Count  Component Value Ref Range Performed At  Lymphocyte Absolute 2.8 x1000/uL [place] [facility]  %Helper Lymphocytes 55.0 % [place] [facility]  T-Helper Lymphs 1.54 X1000/uL [place] [facility]   T-Helper Cells (Cd4) Count  Specimen  Blood   T-Helper Cells (Cd4) Count  Performing Organization Address City/State/Zipcode Phone Number  [facility]  CLIA# [id number], [address]  [place], [place] [address]    Back to top of Lab Results    HIV,Ultraquant HIV,Ultraquant  Component Value Ref Range Performed At  HIV RNA QUANTITATION Target Not Detected Target Not Detected copies/mL [place]  [facility]   HIV,Ultraquant  Specimen  Blood   HIV,Ultraquant  Narrative Performed At    Results determined using the COBAS 6800 real time PCR. The reportable linear range of this assay is 20 to 10,000,000 copies/mL. This test is intended for use as an aid in the management of HIV-infected individuals undergoing Anti -Viral therapy.   [facility]    HIV,Ultraquant  Performing Organization Address City/State/Zipcode Phone Number  [facility]  CLIA# [id number], [address]  [place], [place] [address]    Back to top of Lab Results Visit Diagnoses   Diagnosis

## 2018-07-23 NOTE — Progress Notes (Addendum)
Barbara Marshall, Barbara Marshall, DOB June 05, 1986  MRN: [id number]  Subjective: Patient reports no complaints. She denies nausea, vomiting, chest pain, shortness of breath, abdominal pain, vision changes.  She reports good normal fetal movements of twins.     Objective: General: alert, cooperative and no distress Resp: clear to auscultation bilaterally Cardio: regular rate and rhythm, S1, S2 normal, no murmur, click, rub or gallop GI: soft, non-tender; bowel sounds normal; no masses,  no organomegaly Extremities: extremities normal, atraumatic, no cyanosis or edema  CBC    Component Value Date/Time   WBC 10.7 (H) 07/23/2018 1540   RBC 2.96 (L) 07/23/2018 1540   HGB 8.9 (L) 07/23/2018 1540   HGB 14.1 11/28/2016 0936   HCT 27.0 (L) 07/23/2018 1540   HCT 42.3 11/28/2016 0936   PLT 206 07/23/2018 1540   PLT 314 11/28/2016 0936   MCV 91.2 07/23/2018 1540   MCV 93 11/28/2016 0936   MCH 30.1 07/23/2018 1540   MCHC 33.0 07/23/2018 1540   RDW 14.0 07/23/2018 1540   RDW 12.4 11/28/2016 0936   LYMPHSABS 1.8 11/28/2016 0936   MONOABS 0.1 02/05/2008 1230   EOSABS 0.3 11/28/2016 0936   BASOSABS 0.0 11/28/2016 0936   CMP     Component Value Date/Time   NA 135 07/23/2018 1540   NA 137 11/28/2016 0936   K 3.5 07/23/2018 1540   CL 108 07/23/2018 1540   CO2 17 (L) 07/23/2018 1540   GLUCOSE 136 (H) 07/23/2018 1540   BUN <5 (L) 07/23/2018 1540   BUN 10 11/28/2016 0936   CREATININE 0.56 07/23/2018 1540   CALCIUM 8.5 (L) 07/23/2018 1540   PROT 6.2 (L) 07/23/2018 1540   PROT 8.1 11/28/2016 0936   ALBUMIN 2.6 (L) 07/23/2018 1540   ALBUMIN 4.8 11/28/2016 0936   AST 20 07/23/2018 1540   ALT 13 07/23/2018 1540   ALKPHOS 97 07/23/2018 1540   BILITOT 0.2 (L) 07/23/2018 1540   BILITOT 0.5 11/28/2016 0936   GFRNONAA >60 07/23/2018 1540   GFRAA >60 07/23/2018 1540   NST read at 7pm, 07/23/18: Category 1 for both twins.  Twin A Baseline 120, moderate variability, reactive.  Twin B: Baseline 150, mod  variability, reactive.  TOCO: No contractions.  Assessment/Plan: 32 y/o G1P0 @ 32 weeks 4 days EGA with didi twin pregnancy, HIV positive, on ARVs, with preeclampsia with severe features -fetal monitoring -monitor for signs and symptom of preeclampsia -s/p Betamethasone injection - If remains stable plan for delivery at [redacted] weeks EGA as per MFM.  -Follow up on viral load levels.  -Iron tabs for anemia.   LOS: 1 day  Konrad FelixKULWA,Barbara Lazarus WAKURU, MD.  07/23/2018, 8:04 PM

## 2018-07-23 NOTE — Consult Note (Signed)
CONSULT NOTE      MATERNAL FETAL MEDICINE CONSULT  [name]  Medical Record Number: [id number] Date of Birth: 01/13/1986  Requesting Physician Name: Hoover BrownsEma Kulwa, MD Date of Service:07/23/18  I had the pleasure of seeing Ms. Barbara Marshall [facility].  She is admitted with the diagnosis of preeclampsia.  She is G1 P0 and has a dichorionic diamniotic twin pregnancy.  Gestational age is 32 weeks and 4 days. This is a natural conception. She was seen at the [facility] on 07/21/2018 for ultrasound evaluation.  Concordant fetal growth was seen.  Her blood pressures were increased ranging 162-172/1 06/108 mmHg.  After admission she is taking Procardia XL 30 mg daily.  Her blood pressures have been controlled.  Yesterday she had severe range blood pressures 4 times all systolic over 160 mmHg.  She does not have symptoms of severe headache or visual disturbances or right upper quadrant pain or vaginal bleeding.  She reports good fetal movements.   OB History: G1 P0  GYN history: No history of abnormal Pap smears or cervical surgeries.  Medical history: No istory of hypertension or diabetes.  She has HIV infection that was diagnosed in 2010 and the patient is being followed by ID physician.  She takes antiretroviral medications regularly.  Most recent viral RNA was undetectable.  Surgical history: Wisdom teeth removal.  Medications: Prenatal vitamins, Procardia, Triumeq (600-50-300), lamuvudine 300 mg daily, celexa 20 mg daily/  Allergies: No known drug allergies.  Social:-Tobacco drug or alcohol use.  She is been married 2 years and her husband is in good health.  He is Caucasian  Family: No history of venous thromboembolism in the family.  Physical Examination: Vital stable; blood pressure 149/92 mmHg. Abdomen soft gravid uterus, no tenderness in any of the quadrants. Minimal pedal edema present. NST performed in the Unit has been  reassuring.  Labs: Hemoglobin 8.9, hematocrit 27, WBC 10.7, platelets 206.  AST 18 ALT 17 creatinine 0.5, uric acid 3.8.  24-hour urine protein 390 mg.  CD4 count 660.  GBS negative.  Blood type O posiive.  Viral RNA results are pending.  I counseled the patient on the following: Preeclampsia with severe features: -I explained the diagnosis that it is consistent with preeclampsia with severe features.  Her blood pressures were in the severe range more than 4 hours apart.  I also reassured her of normal labs. -Discussed the possible complications of preeclampsia including maternal stroke, eclampsia, renal failure, pulmonary edema, coagulation disturbances and placental abruption. -I discussed the timing of delivery and recommended delivery at [redacted] weeks gestation.  Maternal risks from continuation of pregnancy beyond 34 weeks far outweighs the neonatal benefits. -Counseled her on the benefit of antenatal corticosteroids. -If presentations of twins stay cephalic/cephalic she cannot vaginal delivery.  Presentations to be checked before delivery.  Discussed mode of delivery based on presentations.  HIV infection and intrapartum management: -If viral RNA count is over thousand copies per mL, cesarean delivery is indicated.  If less than thousand copies per mL vaginal delivery can be attempted. -Intrapartum management with zidovudine is indicated if viral load is greater than 1,000 copies/mL. If less than 1,000 copies/mL is seen, zidovudine need not be given as no increased perinatal transmission is seen. Patient should continue ART in labor or before cesarean delivery. If zidovudine is indicated, it should be administered for at least 3 hours before delivery and continued till clamping of umbilical cord.  -  According to U.S. Department of Health and Human Services Guidelines, Intravenous zidovudine should be administered if viral RNA is greater than 1,000 copies/mL. If viral RNA levels are at 50 copies/mL or  less, intrapartum zidovudine is not necessary. They also mention that intravenous zidovudine "may be considered for women with HIV RNA between 50 and 999 copies/mL.  -ACOG also endorses this view (ACOG Committee Opinion, No 757, September 2018). The likelihood of perinatal transmission without ZDV in this group (between 50 and 999 copies) is about 1% to 2% (as opposed to less than 1%). -In summary, in women with viral RNA copies less than 1,000 copies/mL, intrapartum zidovudine is not necessary.   -I discussed the above recommendations. If viral RNA is between 50 and 999 copies/mL and if the patient prefers intrapartum zidovudine, it may be administered before and during delivery.   -Postoperative infections are slightly more common in patients with HIV infection.     Recommendations: -Continue antihypertensive management. -Daily NST. -BPP at the [facility] on Wednesday, 07/28/2018. -Continue antiretroviral medications. -Discussed with MFM if viral RNA is greater than 50 copies per mL. -Delivery at [redacted] weeks gestation.  Delivery is indicated (not limited to) for following reasons:  -Persistent and uncontrolled hypertension not responding to antihypertensives  -Abnormal labs including increasing liver enzymes or thrombocytopenia (<100,000).  -Non-reassuring fetal heart trace.  -Oligohydramnios or severe fetal growth restriction or abnormal antenatal testings.  -Oliguria (hourly output 20 mL to 30 mL).  -Increasing creatinine (?1.1) Severe symptoms of headache or visual spots or persistent right upper quadrant pain.  -Placental abruption.  Thank you for your consult.  If you have any questions or concerns please do not hesitate to contact me at the [facility].  Consultation including face-to-face counseling 45 minutes.

## 2018-07-23 NOTE — Progress Notes (Signed)
Date Type Department Care Team Description  05/31/2018 Lab Visit Phlebotomy - [facility] [place], [place] [address]  [phone]  Vanessa [place], MD  [address]  [place], [place] [address]  [phone]  [phone] (Fax)  Human immunodeficiency virus (HIV) disease [facility])  Social History - documented as of this encounter  Tobacco Use Types Packs/Day Years Used Date  Never Smoker   0   Smokeless Tobacco: Never Used      Alcohol Use Drinks/Week oz/Week Comments  Yes   1-2 times a month   Sex Assigned at [employer] Date Recorded  Not on file   Discharge Disposition - documented in this encounter  Disposition Code Departure Means Destination  Home or Self Care     Plan of Treatment - documented as of this encounter  Upcoming Encounters Upcoming Encounters  Date Type Specialty Care Team Description  12/13/2018 Office Visit Infectious Diseases Vanessa [place], MD  [address]  [place], [place] [address]  [phone]  [phone] (Fax)    Procedures - documented in this encounter  Procedure Name Priority Date/Time Associated Diagnosis Comments  QUANTIFERON-TB GOLD PLUS IN TUBE (SENDOUT) Routine 05/31/2018 4:03 PM EDT Human immunodeficiency virus (HIV) disease (HCC)  Results for this procedure are in the results section.   RPR (SYPHILIS SEROLOGY) Routine 05/31/2018 4:03 PM EDT Human immunodeficiency virus (HIV) disease (HCC)  Results for this procedure are in the results section.   HIV,ULTRAQUANT Routine 05/31/2018 4:03 PM EDT Human immunodeficiency virus (HIV) disease (HCC)  Results for this procedure are in the results section.   CBC WITH AUTO DIFFERENTIAL PANEL Routine 05/31/2018 4:03 PM EDT Human immunodeficiency virus (HIV) disease (HCC)  Results for this procedure are in the results section.   T-HELPER CELLS (CD4) COUNT Routine 05/31/2018 4:03 PM EDT Human immunodeficiency virus (HIV)  disease (HCC)  Results for this procedure are in the results section.   CBC AND DIFFERENTIAL Routine 05/31/2018 4:03 PM EDT Human immunodeficiency virus (HIV) disease (HCC)  Results for this procedure are in the results section.   PHOSPHORUS Routine 05/31/2018 4:03 PM EDT Human immunodeficiency virus (HIV) disease (HCC)  Results for this procedure are in the results section.   Lab Results - documented in this encounter  Table of Contents for Lab Results  CBC and Differential  QuantiFERON-TB Gold Plus In Tube (Sendout)  Serum Phosphorus  RPR (Syphilis Serology)  HIV,Ultraquant  T-Helper Cells (Cd4) Count  CBC And Differential     CBC and Differential CBC and Differential  Component Value Ref Range Performed At  WBC 11.1 (H) 4.8 - 10.8 x 10*3/uL [place] [facility]  RBC 3.55 (L) 4.20 - 5.40 x 10*6/uL [place] [facility]  Hemoglobin 11.0 (L) 12.0 - 16.0 G/DL [place] [facility]  Hematocrit 32.1 (L) 37.0 - 47.0 % [place] [facility]  MCV 90.3 81.0 - 99.0 FL [place] [facility]  MCH 31.0 27.0 - 31.0 PG [place] [facility]  MCHC 34.3 33.0 - 37.0 G/DL [place] [facility]  RDW 12.8 11.5 - 14.5 % [place] [facility]  Platelets 267 160 - 360 X 10*3/uL [place] [facility]  MPV 7.8 6.8 - 10.2 FL [place] [facility]  Neutrophil % 77 % [place] [facility]  Lymphocyte % 15 % [place] [facility]  Monocyte % 6 % [place] [facility]  Eosinophil % 2 % [place] [facility]  Basophil % 0 % [place] [facility]  Neutrophil Absolute 8.6 (H) 1.6 - 7.3 x 10*3/uL [place] [facility]  Lymphocyte Absolute 1.7 1.0 - 5.1 x 10*3/uL [place] [facility]  Monocyte Absolute 0.6 0.1 - 0.9 x 10*3/uL [place] [facility]  Eosinophil Absolute 0.2 0.0 - 0.5 x 10*3/uL [place] [facility]  Basophil Absolute 0.0 0.0 - 0.2 x 10*3/uL [place] [facility]  NRBC Manual 0 <=0 / 100 WBC [place] [facility]  nRBC  <=0 x 10*3/uL [place] [facility]   CBC and Differential  Specimen  Blood   CBC and Differential  Performing Organization Address City/State/Zipcode Phone Number  [facility]  CLIA# [id number], [address]  [place], [place] [address]    Back to top of Lab Results    QuantiFERON-TB Gold Plus In Tube (Sendout) QuantiFERON-TB Gold Plus In Tube (Sendout)  Component Value Ref Range Performed At  QUANTIFERON INCUBATION Incubation performed.  [facility]  QUANTIFERON CRITERIA Comment Comment:  The QuantiFERON-TB Gold Plus result is determined by subtracting the Nil value from either TB antigen (Ag) tube. The mitogen tube serves as a control for the test.  [facility]  QUANTIFERON TB1 AG VALUE 0.03 IU/mL [facility]  QUANTIFERON TB2 AG VALUE 0.03 IU/mL [facility]  QUANTIFERON NIL VALUE 0.04 IU/mL [facility]  QUANTIFERON MITOGEN VALUE 1.24 IU/mL [facility]  QUANTIFERON-TB GOLD PLUS Negative Negative [facility]   QuantiFERON-TB Gold Plus In Tube (Sendout)  Specimen  Blood   QuantiFERON-TB Gold Plus In Tube (Sendout)  Narrative Performed At  Performed at:[address] [employer]  [address], [place], [id number]  Lab Director: Jolene SchimkeSanjai Nagendra MD, [phone]  [facility]    QuantiFERON-TB Gold Plus In Tube [facility](Sendout)  Performing Organization Address City/State/Zipcode Phone Number  [facility]        Back to top of Lab Results    Serum Phosphorus Serum Phosphorus  Component Value Ref Range Performed At  Phosphorus 3.3 2.5 - 4.5 MG/DL [place] [facility]   Serum Phosphorus  Specimen  Blood   Serum Phosphorus  Performing Organization Address City/State/Zipcode  Phone Number  [facility]  CLIA# [id number], [address]  [place], [place] [id number]    Back to top of Lab Results    RPR (Syphilis Serology) RPR (Syphilis Serology)  Component Value Ref Range Performed At  RPR Nonreactive  [place] [facility]   RPR (Syphilis Serology)  Specimen  Blood   RPR (Syphilis Serology)  Performing Organization Address City/State/Zipcode Phone Number  [facility]  CLIA# [id number], [address]  [place], [place] [id number]    Back to top of Lab Results    HIV,Ultraquant HIV,Ultraquant  Component Value Ref Range Performed At  HIV RNA QUANTITATION Target Not Detected Target Not Detected copies/mL [place] [facility]   HIV,Ultraquant  Specimen  Blood   HIV,Ultraquant  Narrative Performed At    Results determined using the COBAS 6800 real time PCR. The reportable linear range of this assay is 20 to 10,000,000 copies/mL. This test is intended for use as an aid in the management of HIV-infected individuals undergoing Anti -Viral therapy.  [place] [facility]  HIV,Ultraquant  Performing Organization Address City/State/Zipcode Phone Number  [place] [facility]  CLIA# [id number], [address]  [place], [place] [address]    Back to top of Lab Results    T-Helper Cells (Cd4) Count T-Helper Cells (Cd4) Count  Component Value Ref Range Performed At  Lymphocyte Absolute 1.8 1.0 - 5.1 x1000/uL [place] [facility]  %Helper Lymphocytes 54.4 31.0 - 61.0 % [place] [facility]  T-Helper Lymphs 0.98 0.31 - 3.11 X1000/uL [place] [facility]   T-Helper Cells (Cd4) Count  Specimen  Blood   T-Helper Cells (Cd4) Count  Performing Organization Address City/State/Zipcode Phone Number  [facility]  CLIA# [id number], [address]  [place], [place] [address]    Back to top of Lab Results    CBC And Differential CBC And Differential  Specimen  Blood   CBC And Differential  Narrative Performed At  The following orders were created for panel order CBC And Differential.  Procedure Abnormality Status   --------- ----------- ------   CBC and Differential[[id number]] AbnormalFinal result     Please view results for these tests on the individual orders.

## 2018-07-24 ENCOUNTER — Inpatient Hospital Stay (HOSPITAL_BASED_OUTPATIENT_CLINIC_OR_DEPARTMENT_OTHER): Payer: BLUE CROSS/BLUE SHIELD

## 2018-07-24 DIAGNOSIS — O4290 Premature rupture of membranes, unspecified as to length of time between rupture and onset of labor, unspecified weeks of gestation: Secondary | ICD-10-CM

## 2018-07-24 DIAGNOSIS — O98713 Human immunodeficiency virus [HIV] disease complicating pregnancy, third trimester: Secondary | ICD-10-CM

## 2018-07-24 DIAGNOSIS — O99213 Obesity complicating pregnancy, third trimester: Secondary | ICD-10-CM

## 2018-07-24 DIAGNOSIS — Z3A32 32 weeks gestation of pregnancy: Secondary | ICD-10-CM

## 2018-07-24 DIAGNOSIS — O30043 Twin pregnancy, dichorionic/diamniotic, third trimester: Secondary | ICD-10-CM

## 2018-07-24 LAB — AMNISURE RUPTURE OF MEMBRANE (ROM) NOT AT ARMC: Amnisure ROM: POSITIVE

## 2018-07-24 MED ORDER — LABETALOL HCL 5 MG/ML IV SOLN
20.0000 mg | INTRAVENOUS | Status: DC | PRN
  Administered 2018-07-24 – 2018-07-26 (×2): 20 mg via INTRAVENOUS
  Filled 2018-07-24 (×2): qty 4

## 2018-07-24 MED ORDER — HYDRALAZINE HCL 20 MG/ML IJ SOLN: 10.0000 mg | INTRAMUSCULAR | Status: DC | PRN

## 2018-07-24 MED ORDER — LABETALOL HCL 5 MG/ML IV SOLN
80.0000 mg | INTRAVENOUS | Status: DC | PRN
  Filled 2018-07-24: qty 16

## 2018-07-24 MED ORDER — SODIUM CHLORIDE 0.9 % IV SOLN
2.0000 g | Freq: Four times a day (QID) | INTRAVENOUS | Status: AC
  Administered 2018-07-24 – 2018-07-26 (×8): 2 g via INTRAVENOUS
  Filled 2018-07-24 (×8): qty 2

## 2018-07-24 MED ORDER — SODIUM CHLORIDE 0.9 % IV SOLN
5.0000 10*6.[IU] | Freq: Once | INTRAVENOUS | Status: DC
  Filled 2018-07-24: qty 5

## 2018-07-24 MED ORDER — PENICILLIN G 3 MILLION UNITS IVPB - SIMPLE MED
3.0000 10*6.[IU] | INTRAVENOUS | Status: DC
  Filled 2018-07-24 (×2): qty 100

## 2018-07-24 MED ORDER — LABETALOL HCL 5 MG/ML IV SOLN: 40.0000 mg | INTRAVENOUS | Status: DC | PRN

## 2018-07-24 MED ORDER — AMOXICILLIN 500 MG PO CAPS
500.0000 mg | ORAL_CAPSULE | Freq: Three times a day (TID) | ORAL | Status: DC
  Filled 2018-07-24 (×3): qty 1

## 2018-07-24 MED ORDER — AMOXICILLIN 500 MG PO CAPS: 500.0000 mg | ORAL_CAPSULE | Freq: Three times a day (TID) | ORAL | Status: DC

## 2018-07-24 MED ORDER — AZITHROMYCIN 250 MG PO TABS
1000.0000 mg | ORAL_TABLET | Freq: Once | ORAL | Status: AC
  Administered 2018-07-24: 1000 mg via ORAL
  Filled 2018-07-24: qty 4

## 2018-07-24 NOTE — Progress Notes (Addendum)
Barbara Marshall is a 32 y.o. G1P0 at 4657w5d pregnant with di/di twins. She was admitted for preeclampsia with severe features. She is also HIV positive, taking daily medication and with an undetectable viral load when labs were last done.   Subjective: Called by RN to report patient had a gush of clear fluid. Amnisure was sent and it was positive. Dr. Sallye OberKulwa and MFM MD consulted regarding plan of care. Ultrasound was ordered and it appears Baby A's membranes ruptured. Both babies are vertex. Patient does not report contractions at this time.   Objective: Vitals:   07/24/18 1141 07/24/18 1615 07/24/18 1632 07/24/18 1654  BP: (!) 145/98 (!) 160/109 (!) 158/110 (!) 148/99  Pulse: 81 91  90  Resp: 18 18    Temp: 99.1 F (37.3 C)     TempSrc: Oral     SpO2: 100% 96%    Weight:      Height:       Results for orders placed or performed during the hospital encounter of 07/21/18 (from the past 24 hour(s))  Protein / creatinine ratio, urine     Status: Abnormal   Collection Time: 07/23/18  7:30 PM  Result Value Ref Range   Creatinine, Urine 40.00 mg/dL   Total Protein, Urine 11 mg/dL   Protein Creatinine Ratio 0.28 (H) 0.00 - 0.15 mg/mg[Cre]  Amnisure rupture of membrane (rom)[facility]     Status: None   Collection Time: 07/24/18  4:05 PM  Result Value Ref Range   Amnisure ROM POSITIVE     NST: Category 1 for both twins.  Twin A Baseline 155, moderate variability, reactive. Twin B: Baseline 160, mod variability, reactive.  TOCO: No contractions.   Vaginal exam: 3.5/80/+1/vtx, membranes still palpable across fetal scalp of baby A.   Assessment/Plan: 32 y/o G1P0 @ 32 weeks 5 days EGA with didi twin pregnancy HIV positive, on ARVs, with preeclampsia with severe features PPROM -fetal monitoring -monitor for signs and symptom of preeclampsia -s/p Betamethasone injection - If remains stable plan for delivery at [redacted] weeks EGA as per MFM.  -Follow up on viral load levels, lab results pending.  If patient labors before results are back, consider patient to have undetectable viral load (as this was the case throughout pregnancy and patient regularly takes her medications). Plan is for vaginal delivery at this time.  -Repeat CBC and CMP q3 days or as needed -Per MFM, patient needs BPP on 8/21 with MFM, order placed. Regarding PPROM: MFM recommends latency antibiotics and expectant management. If patient labors, allow labor to progress naturally. If patient does not labor, continue to plan IOL for 34 weeks.  -Iron tabs for anemia -Continue antiretroviral medication -Latency antibiotics    LOS: 2 days  Janeece Riggersllis K Marshall 07/24/2018, 5:49 PM I agree with above findings, assessment and plan per CNM Barbara Marshall.  I discussed patient with MFM Dr. Ander SladeJoy, Wilson SingerSaju who recommended expectant management until [redacted] weeks EGA unless there are signs and symptoms of infection or preeclampsia with unctontrollable severe features or unstable maternal or fetal status.  If patient labors plan is to avoid tocolysis.  Follow up on viral load.  If she remains with undetectable viral loads then she does not need IV antiretrovirals at this time or in labor.   Dr. Sallye OberKulwa 07/24/18 at 1151pm.

## 2018-07-24 NOTE — Plan of Care (Signed)
  Problem: Pain Managment: Goal: General experience of comfort will improve Outcome: Progressing   Problem: Education: Goal: Knowledge of disease or condition will improve Outcome: Progressing   Problem: Pain Management: Goal: Relief or control of pain will improve Outcome: Progressing

## 2018-07-24 NOTE — Progress Notes (Addendum)
Newton Piggshley Wainwright is a 32 y.o. G1P0 at 4273w5d pregnant with di/di twins. She was admitted for preeclampsia with severe features. She is also HIV positive, taking daily medication and with an undetectable viral load when labs were last done.   Subjective: Patient reports no complaints. She denies headache, vision changes, or epigastric pain. She reports good normal fetal movements of twins.     Objective: Vitals:   07/24/18 0341 07/24/18 0759 07/24/18 1018 07/24/18 1141  BP: 135/80 (!) 142/97 (!) 141/86 (!) 145/98  Pulse: 86 68 87 81  Resp: 18 18  18   Temp: 97.9 F (36.6 C) 98 F (36.7 C)  99.1 F (37.3 C)  TempSrc: Oral Oral  Oral  SpO2: 99% 100%  100%  Weight:      Height:       Results for orders placed or performed during the hospital encounter of 07/21/18 (from the past 24 hour(s))  CBC     Status: Abnormal   Collection Time: 07/23/18  3:40 PM  Result Value Ref Range   WBC 10.7 (H) 4.0 - 10.5 K/uL   RBC 2.96 (L) 3.87 - 5.11 MIL/uL   Hemoglobin 8.9 (L) 12.0 - 15.0 g/dL   HCT 16.127.0 (L) 09.636.0 - 04.546.0 %   MCV 91.2 78.0 - 100.0 fL   MCH 30.1 26.0 - 34.0 pg   MCHC 33.0 30.0 - 36.0 g/dL   RDW 40.914.0 81.111.5 - 91.415.5 %   Platelets 206 150 - 400 K/uL  Comprehensive metabolic panel     Status: Abnormal   Collection Time: 07/23/18  3:40 PM  Result Value Ref Range   Sodium 135 135 - 145 mmol/L   Potassium 3.5 3.5 - 5.1 mmol/L   Chloride 108 98 - 111 mmol/L   CO2 17 (L) 22 - 32 mmol/L   Glucose, Bld 136 (H) 70 - 99 mg/dL   BUN <5 (L) 6 - 20 mg/dL   Creatinine, Ser 7.820.56 0.44 - 1.00 mg/dL   Calcium 8.5 (L) 8.9 - 10.3 mg/dL   Total Protein 6.2 (L) 6.5 - 8.1 g/dL   Albumin 2.6 (L) 3.5 - 5.0 g/dL   AST 20 15 - 41 U/L   ALT 13 0 - 44 U/L   Alkaline Phosphatase 97 38 - 126 U/L   Total Bilirubin 0.2 (L) 0.3 - 1.2 mg/dL   GFR calc non Af Amer >60 >60 mL/min   GFR calc Af Amer >60 >60 mL/min   Anion gap 10 5 - 15  Protein / creatinine ratio, urine     Status: Abnormal   Collection Time: 07/23/18   7:30 PM  Result Value Ref Range   Creatinine, Urine 40.00 mg/dL   Total Protein, Urine 11 mg/dL   Protein Creatinine Ratio 0.28 (H) 0.00 - 0.15 mg/mg[Cre]    NST: Category 1 for both twins.  Twin A Baseline 145, moderate variability, reactive. Twin B: Baseline 145, mod variability, reactive.  TOCO: No contractions.   Assessment/Plan: 32 y/o G1P0 @ 32 weeks 5 days EGA with didi twin pregnancy HIV positive, on ARVs, with preeclampsia with severe features -fetal monitoring -monitor for signs and symptom of preeclampsia -s/p Betamethasone injection - If remains stable plan for delivery at [redacted] weeks EGA as per MFM.  -Follow up on viral load levels, lab results pending  -Repeat CBC and CMP q3 days or as needed -Per MFM, patient needs BPP on 8/21 with MFM, order placed  -Iron tabs for anemia -Continue antiretroviral medication   LOS:  2 days  Janeece Riggersllis K Greer 07/24/2018, 1:17 PM  I saw and examined patient at bedside and agree with the above findings, assessment and plan as per CNM Kathalene FramesEllis Greer.  Next CBC and CMP check will be 8/19.  NST reviewed and appear category 1 for both twins at 1020 am today.  Both twins with baseline 150, moderate variability, reactive.  TOCO: with some irritability.  Dr. Sallye OberKulwa.

## 2018-07-25 LAB — HIV-1 RNA QUANT-NO REFLEX-BLD
HIV 1 RNA Quant: <20 copies/mL
LOG10 HIV-1 RNA: UNDETERMINED log10copy/mL

## 2018-07-25 NOTE — Progress Notes (Signed)
Name: Barbara Marshall Medical Record Number:  [id number] Date of Birth: Nov 29, 1986 Date of Service: 07/25/2018  32 y.o. G1P0 5115w6d HD#3 admitted for  Di/Di twins with preeclampsia, PPROM.  Patient notes continual leaking of fluid. She denies contractions, no vaginal bleeding. Reports good FM.  The patient's past medical history and prenatal records were reviewed.  Additional issues addressed and updated today: Patient Active Problem List   Diagnosis Date Noted  . Preeclampsia, third trimester 07/22/2018  . Hypertension affecting pregnancy 07/21/2018  . Anxiety 11/29/2016  . Human immunodeficiency virus (HIV) disease (HCC) 11/29/2016    Physical Examination:   Vitals:   07/25/18 0336 07/25/18 0803  BP: 132/80 (!) 149/90  Pulse: 78 73  Resp: 17 18  Temp: 98 F (36.7 C) 98.3 F (36.8 C)  SpO2: 100% 99%   General appearance - alert, well appearing, and in no distress and oriented to person, place, and time Mental status - alert, oriented to person, place, and time, normal mood, behavior, speech, dress, motor activity, and thought processes Abd  Soft, gravid, nontender Ex SCDs FHTs A:145 baseline moderate variability accels no decels B: 150s baseline moderate variability accels no decels Cat I Toco  No Ctx  Cervix: not evaluated  Results for orders placed or performed during the hospital encounter of 07/21/18 (from the past 24 hour(s))  Amnisure rupture of membrane (rom)not at [facility]     Status: None   Collection Time: 07/24/18  4:05 PM  Result Value Ref Range   Amnisure ROM POSITIVE     Assessment: HD#3  7015w6d [name] / [name] Twin pregnancy with PPROM preeclampsia with severe features  Plan: 1. Change latency antibiotics to PO 2. Closely monitor for signs of labor or infection, if presents then will transfer to L&D to deliver 3. Any severe features ie intractable HA, severe range BPs will delivery, will continue po procardia 4. Plan is for induction of labor at 34 weeks per  MFM 5. Steroid complete 6. GBS POS Ampicillin during labor 7. Rubella Non-Immune will offer vaccination post partum 8. HIV + Viral titers are pending if RNA levels or <50 copies / ML intrapartum zidovudine is not necessary  Saniah Schroeter STACIA

## 2018-07-26 ENCOUNTER — Encounter (HOSPITAL_COMMUNITY): Payer: Self-pay | Admitting: *Deleted

## 2018-07-26 ENCOUNTER — Inpatient Hospital Stay (HOSPITAL_COMMUNITY): Payer: BLUE CROSS/BLUE SHIELD | Admitting: Anesthesiology

## 2018-07-26 DIAGNOSIS — Z789 Other specified health status: Secondary | ICD-10-CM | POA: Diagnosis present

## 2018-07-26 DIAGNOSIS — O42113 Preterm premature rupture of membranes, onset of labor more than 24 hours following rupture, third trimester: Secondary | ICD-10-CM | POA: Diagnosis not present

## 2018-07-26 DIAGNOSIS — Z3490 Encounter for supervision of normal pregnancy, unspecified, unspecified trimester: Secondary | ICD-10-CM

## 2018-07-26 DIAGNOSIS — O30043 Twin pregnancy, dichorionic/diamniotic, third trimester: Secondary | ICD-10-CM | POA: Diagnosis present

## 2018-07-26 LAB — CBC
HCT: 31.3 % — ABNORMAL LOW (ref 36.0–46.0)
HCT: 29.3 % — ABNORMAL LOW (ref 36.0–46.0)
Hemoglobin: 10.5 g/dL — ABNORMAL LOW (ref 12.0–15.0)
Hemoglobin: 9.8 g/dL — ABNORMAL LOW (ref 12.0–15.0)
MCH: 29.7 pg (ref 26.0–34.0)
MCH: 30 pg (ref 26.0–34.0)
MCHC: 33.5 g/dL (ref 30.0–36.0)
MCHC: 33.4 g/dL (ref 30.0–36.0)
MCV: 88.4 fL (ref 78.0–100.0)
MCV: 89.6 fL (ref 78.0–100.0)
PLATELETS: 254 10*3/uL (ref 150–400)
PLATELETS: 225 10*3/uL (ref 150–400)
RBC: 3.54 MIL/uL — AB (ref 3.87–5.11)
RBC: 3.27 MIL/uL — ABNORMAL LOW (ref 3.87–5.11)
RDW: 13.7 % (ref 11.5–15.5)
RDW: 13.7 % (ref 11.5–15.5)
WBC: 12.8 10*3/uL — AB (ref 4.0–10.5)
WBC: 18.4 10*3/uL — ABNORMAL HIGH (ref 4.0–10.5)

## 2018-07-26 LAB — COMPREHENSIVE METABOLIC PANEL
ALK PHOS: 120 U/L (ref 38–126)
ALT: 15 U/L (ref 0–44)
ALT: 16 U/L (ref 0–44)
ANION GAP: 11 (ref 5–15)
AST: 21 U/L (ref 15–41)
AST: 21 U/L (ref 15–41)
Albumin: 2.6 g/dL — ABNORMAL LOW (ref 3.5–5.0)
Albumin: 2.7 g/dL — ABNORMAL LOW (ref 3.5–5.0)
Alkaline Phosphatase: 100 U/L (ref 38–126)
Anion gap: 9 (ref 5–15)
BUN: 5 mg/dL — ABNORMAL LOW (ref 6–20)
BUN: 7 mg/dL (ref 6–20)
CALCIUM: 8.8 mg/dL — AB (ref 8.9–10.3)
CHLORIDE: 106 mmol/L (ref 98–111)
CO2: 20 mmol/L — ABNORMAL LOW (ref 22–32)
CO2: 18 mmol/L — AB (ref 22–32)
CREATININE: 0.52 mg/dL (ref 0.44–1.00)
Calcium: 8.1 mg/dL — ABNORMAL LOW (ref 8.9–10.3)
Chloride: 106 mmol/L (ref 98–111)
Creatinine, Ser: 0.45 mg/dL (ref 0.44–1.00)
Glucose, Bld: 78 mg/dL (ref 70–99)
Glucose, Bld: 89 mg/dL (ref 70–99)
POTASSIUM: 3.2 mmol/L — AB (ref 3.5–5.1)
Potassium: 3.4 mmol/L — ABNORMAL LOW (ref 3.5–5.1)
Sodium: 135 mmol/L (ref 135–145)
Sodium: 135 mmol/L (ref 135–145)
TOTAL PROTEIN: 6.4 g/dL — AB (ref 6.5–8.1)
Total Bilirubin: 0.7 mg/dL (ref 0.3–1.2)
Total Bilirubin: 0.5 mg/dL (ref 0.3–1.2)
Total Protein: 6.2 g/dL — ABNORMAL LOW (ref 6.5–8.1)

## 2018-07-26 LAB — CBC WITH DIFFERENTIAL/PLATELET
Basophils Absolute: 0 10*3/uL (ref 0.0–0.1)
Basophils Relative: 0 %
EOS ABS: 0.1 10*3/uL (ref 0.0–0.7)
EOS PCT: 1 %
HCT: 28.6 % — ABNORMAL LOW (ref 36.0–46.0)
Hemoglobin: 9.3 g/dL — ABNORMAL LOW (ref 12.0–15.0)
Lymphocytes Relative: 20 %
Lymphs Abs: 2.3 10*3/uL (ref 0.7–4.0)
MCH: 29.6 pg (ref 26.0–34.0)
MCHC: 32.5 g/dL (ref 30.0–36.0)
MCV: 91.1 fL (ref 78.0–100.0)
MONO ABS: 0.8 10*3/uL (ref 0.1–1.0)
Monocytes Relative: 7 %
Neutro Abs: 8.1 10*3/uL — ABNORMAL HIGH (ref 1.7–7.7)
Neutrophils Relative %: 72 %
Platelets: 213 10*3/uL (ref 150–400)
RBC: 3.14 MIL/uL — ABNORMAL LOW (ref 3.87–5.11)
RDW: 13.8 % (ref 11.5–15.5)
WBC: 11.2 10*3/uL — ABNORMAL HIGH (ref 4.0–10.5)

## 2018-07-26 LAB — TYPE AND SCREEN
ABO/RH(D): O POS
ABO/RH(D): O POS
ANTIBODY SCREEN: NEGATIVE
Antibody Screen: NEGATIVE

## 2018-07-26 MED ORDER — PRENATAL MULTIVITAMIN CH
1.0000 | ORAL_TABLET | Freq: Every day | ORAL | Status: DC
  Administered 2018-07-27: 1 via ORAL
  Filled 2018-07-26: qty 1

## 2018-07-26 MED ORDER — ACETAMINOPHEN 325 MG PO TABS: 650.0000 mg | ORAL_TABLET | ORAL | Status: DC | PRN

## 2018-07-26 MED ORDER — COCONUT OIL OIL: 1.0000 "application " | TOPICAL_OIL | Status: DC | PRN

## 2018-07-26 MED ORDER — LIDOCAINE HCL (PF) 1 % IJ SOLN: 30.0000 mL | INTRAMUSCULAR | Status: DC | PRN

## 2018-07-26 MED ORDER — LACTATED RINGERS IV SOLN: 500.0000 mL | Freq: Once | INTRAVENOUS | Status: AC

## 2018-07-26 MED ORDER — LACTATED RINGERS IV SOLN: 500.0000 mL | Freq: Once | INTRAVENOUS | Status: DC

## 2018-07-26 MED ORDER — ONDANSETRON HCL 4 MG/2ML IJ SOLN: 4.0000 mg | Freq: Four times a day (QID) | INTRAMUSCULAR | Status: DC | PRN

## 2018-07-26 MED ORDER — LACTATED RINGERS IV SOLN: INTRAVENOUS | Status: DC

## 2018-07-26 MED ORDER — POTASSIUM CHLORIDE CRYS ER 20 MEQ PO TBCR
20.0000 meq | EXTENDED_RELEASE_TABLET | Freq: Two times a day (BID) | ORAL | Status: DC
  Administered 2018-07-26: 20 meq via ORAL
  Filled 2018-07-26 (×3): qty 1

## 2018-07-26 MED ORDER — LIDOCAINE HCL (PF) 1 % IJ SOLN
INTRAMUSCULAR | Status: DC | PRN
  Administered 2018-07-26: 5 mL via EPIDURAL
  Administered 2018-07-26 (×3): 3 mL via EPIDURAL

## 2018-07-26 MED ORDER — OXYCODONE-ACETAMINOPHEN 5-325 MG PO TABS: 2.0000 | ORAL_TABLET | ORAL | Status: DC | PRN

## 2018-07-26 MED ORDER — SODIUM CHLORIDE 0.9 % IV SOLN
2.0000 g | Freq: Once | INTRAVENOUS | Status: AC
  Administered 2018-07-26: 2 g via INTRAVENOUS
  Filled 2018-07-26: qty 2

## 2018-07-26 MED ORDER — TETANUS-DIPHTH-ACELL PERTUSSIS 5-2.5-18.5 LF-MCG/0.5 IM SUSP
0.5000 mL | Freq: Once | INTRAMUSCULAR | Status: AC
  Administered 2018-07-28: 0.5 mL via INTRAMUSCULAR

## 2018-07-26 MED ORDER — NIFEDIPINE ER OSMOTIC RELEASE 30 MG PO TB24
30.0000 mg | ORAL_TABLET | Freq: Once | ORAL | Status: AC
  Administered 2018-07-26: 30 mg via ORAL
  Filled 2018-07-26: qty 1

## 2018-07-26 MED ORDER — SIMETHICONE 80 MG PO CHEW
80.0000 mg | CHEWABLE_TABLET | ORAL | Status: DC | PRN
  Filled 2018-07-26: qty 1

## 2018-07-26 MED ORDER — EPHEDRINE 5 MG/ML INJ: 10.0000 mg | INTRAVENOUS | Status: DC | PRN

## 2018-07-26 MED ORDER — FENTANYL 2.5 MCG/ML BUPIVACAINE 1/10 % EPIDURAL INFUSION (WH - ANES)
14.0000 mL/h | INTRAMUSCULAR | Status: DC | PRN
  Administered 2018-07-26: 14 mL/h via EPIDURAL
  Filled 2018-07-26: qty 100

## 2018-07-26 MED ORDER — IBUPROFEN 600 MG PO TABS
600.0000 mg | ORAL_TABLET | Freq: Four times a day (QID) | ORAL | Status: DC
  Administered 2018-07-26 – 2018-07-28 (×6): 600 mg via ORAL
  Filled 2018-07-26 (×6): qty 1

## 2018-07-26 MED ORDER — BENZOCAINE-MENTHOL 20-0.5 % EX AERO: 1.0000 "application " | INHALATION_SPRAY | CUTANEOUS | Status: DC | PRN

## 2018-07-26 MED ORDER — LACTATED RINGERS IV SOLN: 500.0000 mL | INTRAVENOUS | Status: DC | PRN

## 2018-07-26 MED ORDER — ONDANSETRON HCL 4 MG PO TABS
4.0000 mg | ORAL_TABLET | ORAL | Status: DC | PRN
  Filled 2018-07-26: qty 1

## 2018-07-26 MED ORDER — DIPHENHYDRAMINE HCL 50 MG/ML IJ SOLN: 12.5000 mg | INTRAMUSCULAR | Status: DC | PRN

## 2018-07-26 MED ORDER — OXYTOCIN 40 UNITS IN LACTATED RINGERS INFUSION - SIMPLE MED: 2.5000 [IU]/h | INTRAVENOUS | Status: DC

## 2018-07-26 MED ORDER — ZOLPIDEM TARTRATE 5 MG PO TABS: 5.0000 mg | ORAL_TABLET | Freq: Every evening | ORAL | Status: DC | PRN

## 2018-07-26 MED ORDER — LIDOCAINE HCL (PF) 1 % IJ SOLN
INTRAMUSCULAR | Status: AC
  Filled 2018-07-26: qty 30

## 2018-07-26 MED ORDER — PHENYLEPHRINE 40 MCG/ML (10ML) SYRINGE FOR IV PUSH (FOR BLOOD PRESSURE SUPPORT): 80.0000 ug | PREFILLED_SYRINGE | INTRAVENOUS | Status: DC | PRN

## 2018-07-26 MED ORDER — NIFEDIPINE ER OSMOTIC RELEASE 30 MG PO TB24
60.0000 mg | ORAL_TABLET | Freq: Every day | ORAL | Status: DC
  Administered 2018-07-27: 60 mg via ORAL
  Filled 2018-07-26: qty 2
  Filled 2018-07-26: qty 1

## 2018-07-26 MED ORDER — OXYTOCIN 40 UNITS IN LACTATED RINGERS INFUSION - SIMPLE MED
INTRAVENOUS | Status: AC
  Administered 2018-07-26: 500 mL via INTRAVENOUS
  Filled 2018-07-26: qty 1000

## 2018-07-26 MED ORDER — DIPHENHYDRAMINE HCL 25 MG PO CAPS: 25.0000 mg | ORAL_CAPSULE | Freq: Four times a day (QID) | ORAL | Status: DC | PRN

## 2018-07-26 MED ORDER — WITCH HAZEL-GLYCERIN EX PADS: 1.0000 "application " | MEDICATED_PAD | CUTANEOUS | Status: DC | PRN

## 2018-07-26 MED ORDER — ONDANSETRON HCL 4 MG/2ML IJ SOLN: 4.0000 mg | INTRAMUSCULAR | Status: DC | PRN

## 2018-07-26 MED ORDER — OXYTOCIN BOLUS FROM INFUSION
500.0000 mL | Freq: Once | INTRAVENOUS | Status: AC
  Administered 2018-07-26: 500 mL via INTRAVENOUS

## 2018-07-26 MED ORDER — FLEET ENEMA 7-19 GM/118ML RE ENEM: 1.0000 | ENEMA | Freq: Every day | RECTAL | Status: DC | PRN

## 2018-07-26 MED ORDER — OXYCODONE-ACETAMINOPHEN 5-325 MG PO TABS: 1.0000 | ORAL_TABLET | ORAL | Status: DC | PRN

## 2018-07-26 MED ORDER — DIBUCAINE 1 % RE OINT: 1.0000 "application " | TOPICAL_OINTMENT | RECTAL | Status: DC | PRN

## 2018-07-26 MED ORDER — SENNOSIDES-DOCUSATE SODIUM 8.6-50 MG PO TABS
2.0000 | ORAL_TABLET | ORAL | Status: DC
  Administered 2018-07-26 – 2018-07-27 (×2): 2 via ORAL
  Filled 2018-07-26 (×2): qty 2

## 2018-07-26 MED ORDER — PHENYLEPHRINE 40 MCG/ML (10ML) SYRINGE FOR IV PUSH (FOR BLOOD PRESSURE SUPPORT)
80.0000 ug | PREFILLED_SYRINGE | INTRAVENOUS | Status: DC | PRN
  Filled 2018-07-26: qty 10

## 2018-07-26 MED ORDER — SOD CITRATE-CITRIC ACID 500-334 MG/5ML PO SOLN: 30.0000 mL | ORAL | Status: DC | PRN

## 2018-07-26 NOTE — Progress Notes (Addendum)
Patient ID: Barbara Marshall, female   DOB: August 16, 1986, [age] y.o.   MRN: [id number]  Name: Barbara Marshall Medical Record Number:  [id number] Date of Birth: August 16, 1986 Date of Service: 07/26/2018  32 y.o. G1P0 4349w6d H4 admitted for  Di/Di twins with preeclampsia, PPROM.  Patient notes continual leaking of fluid. She denies contractions, no vaginal bleeding. Reports good FM. No fever/chills. No c/o. Has SCDs applied. No CP/HA/SOB  The patient's past medical history and prenatal records were reviewed.  Additional issues addressed and updated today: Patient Active Problem List   Diagnosis Date Noted  . Not immune to rubella 07/26/2018  . Preterm premature rupture of membranes (PPROM) with onset of labor after 24 hours of rupture in third trimester, antepartum 07/26/2018  . Twin pregnancy, dichorionic/diamniotic, third trimester 07/26/2018  . Severe preeclampsia, third trimester 07/22/2018  . Hypertension affecting pregnancy 07/21/2018  . Anxiety 11/29/2016  . Human immunodeficiency virus (HIV) disease (HCC) 11/29/2016    Physical Examination:   Vitals:   07/26/18 0515 07/26/18 0744  BP: (!) 146/96 (!) 149/88  Pulse: 75 74  Resp: 17 18  Temp: 98.2 F (36.8 C) 98.1 F (36.7 C)  SpO2: 99% 100%   General appearance - alert, well appearing, and in no distress and oriented to person, place, and time Mental status - alert, oriented to person, place, and time, normal mood, behavior, speech, dress, motor activity, and thought processes Abd  Soft, gravid, nontender Ex SCDs; nontender, no edema BLE FHTs : Normal FHR for both. Moderate variability. +accels, no decels. Reactive x2 Toco  No Ctx  Cervix: not evaluated  Results for orders placed or performed during the hospital encounter of 07/21/18 (from the past 24 hour(s))  CBC with Differential/Platelet     Status: Abnormal   Collection Time: 07/26/18  7:53 AM  Result Value Ref Range   WBC 11.2 (H) 4.0 - 10.5 K/uL   RBC 3.14 (L) 3.87 -  5.11 MIL/uL   Hemoglobin 9.3 (L) 12.0 - 15.0 g/dL   HCT 53.628.6 (L) 64.436.0 - 03.446.0 %   MCV 91.1 78.0 - 100.0 fL   MCH 29.6 26.0 - 34.0 pg   MCHC 32.5 30.0 - 36.0 g/dL   RDW 74.213.8 59.511.5 - 63.815.5 %   Platelets 213 150 - 400 K/uL   Neutrophils Relative % 72 %   Neutro Abs 8.1 (H) 1.7 - 7.7 K/uL   Lymphocytes Relative 20 %   Lymphs Abs 2.3 0.7 - 4.0 K/uL   Monocytes Relative 7 %   Monocytes Absolute 0.8 0.1 - 1.0 K/uL   Eosinophils Relative 1 %   Eosinophils Absolute 0.1 0.0 - 0.7 K/uL   Basophils Relative 0 %   Basophils Absolute 0.0 0.0 - 0.1 K/uL  Comprehensive metabolic panel     Status: Abnormal   Collection Time: 07/26/18  7:53 AM  Result Value Ref Range   Sodium 135 135 - 145 mmol/L   Potassium 3.2 (L) 3.5 - 5.1 mmol/L   Chloride 106 98 - 111 mmol/L   CO2 20 (L) 22 - 32 mmol/L   Glucose, Bld 78 70 - 99 mg/dL   BUN 5 (L) 6 - 20 mg/dL   Creatinine, Ser 7.560.45 0.44 - 1.00 mg/dL   Calcium 8.1 (L) 8.9 - 10.3 mg/dL   Total Protein 6.2 (L) 6.5 - 8.1 g/dL   Albumin 2.6 (L) 3.5 - 5.0 g/dL   AST 21 15 - 41 U/L   ALT 15 0 - 44 U/L  Alkaline Phosphatase 100 38 - 126 U/L   Total Bilirubin 0.7 0.3 - 1.2 mg/dL   GFR calc non Af Amer >60 >60 mL/min   GFR calc Af Amer >60 >60 mL/min   Anion gap 9 5 - 15    Assessment: HD#4  2658w6d [name] / [name] Twin pregnancy with PPROM and preeclampsia with severe features  Plan: 1. Continue latency antibiotics  2. No s/s infection at this time. Continue to monitor and transfer to L&D if present.  3. No s/s of worsening/unstable preeclampsia. Labs reviewed today - normal. Continue repeat every 3 days as planned.  a. Continue procardia. Consider increasing to 60mg  XL given the rise in BP in PM and that she is still staying >140s/90s. Will discuss with Dr Normand Sloopillard b. Dx preeclampsia with severe features based on severe range BP. Plan to deliver with unstable BP or other severe features. 4. Continue plan for induction of labor at 34 weeks per MFM 5. Steroid  complete 6. GBS POS Ampicillin during labor 7. Rubella Non-Immune will offer vaccination post partum 8. HIV + Viral titers are done - Not detectable. Zidovudine is not necessary.  [name]   Agree with above.  Blood pressures continue to increase will increase procardia to 60 mg xl daily

## 2018-07-26 NOTE — Anesthesia Preprocedure Evaluation (Signed)
Anesthesia Evaluation  Patient identified by MRN, date of birth, ID band Patient awake    Reviewed: Allergy & Precautions, NPO status , Patient's Chart, lab work & pertinent test results  Airway Mallampati: II  TM Distance: >3 FB Neck ROM: Full    Dental  (+) Teeth Intact, Dental Advisory Given   Pulmonary asthma ,    Pulmonary exam normal breath sounds clear to auscultation       Cardiovascular hypertension, Normal cardiovascular exam Rhythm:Regular Rate:Normal     Neuro/Psych PSYCHIATRIC DISORDERS Anxiety negative neurological ROS     GI/Hepatic negative GI ROS, Neg liver ROS,   Endo/Other  Obesity   Renal/GU negative Renal ROS     Musculoskeletal negative musculoskeletal ROS (+)   Abdominal   Peds  Hematology  (+) Blood dyscrasia, anemia , HIV, Plt 254k   Anesthesia Other Findings Day of surgery medications reviewed with the patient.  Reproductive/Obstetrics (+) Pregnancy Pre-eclampsia Preterm labor with di-di twins                             Anesthesia Physical Anesthesia Plan  ASA: III  Anesthesia Plan: Epidural   Post-op Pain Management:    Induction:   PONV Risk Score and Plan: 2 and Treatment may vary due to age or medical condition  Airway Management Planned: Natural Airway  Additional Equipment:   Intra-op Plan:   Post-operative Plan:   Informed Consent: I have reviewed the patients History and Physical, chart, labs and discussed the procedure including the risks, benefits and alternatives for the proposed anesthesia with the patient or authorized representative who has indicated his/her understanding and acceptance.   Dental advisory given  Plan Discussed with:   Anesthesia Plan Comments: (Patient identified. Risks/Benefits/Options discussed with patient including but not limited to bleeding, infection, nerve damage, paralysis, failed block, incomplete pain  control, headache, blood pressure changes, nausea, vomiting, reactions to medication both or allergic, itching and postpartum back pain. Confirmed with bedside nurse the patient's most recent platelet count. Confirmed with patient that they are not currently taking any anticoagulation, have any bleeding history or any family history of bleeding disorders. Patient expressed understanding and wished to proceed. All questions were answered. )        Anesthesia Quick Evaluation

## 2018-07-26 NOTE — Consult Note (Signed)
[facility]  Delivery Note:  SVD    07/26/2018  9:09 PM  I was called to the delivery room at the request of the patient's obstetrician (Dr. Mora ApplPinn) for vaginal delivery of di/di twins at 7533 and 0/[redacted] weeks gestation.  PRENATAL HX:  This is a 32 y/o G1P0 at 5533 and 0/[redacted] weeks gestation who was admitted on 8/15 for pre-eclampsia with severe features.  She then had PPROM of baby A on 8/17.  She began laboring this evening and rapidly progressed to vaginal delivery.  ROM of baby B was at delivery.  She received BMZ on 8/15 and 8/16.  She is HSV positive but with an undetectable viral load on antiretroviral therapy, so there was no contraindication to deliver vaginally.  She also did not require intrapartum AZT.  She is HSV positive but with no lesions.  She is GBS positive, and received multiple doses of ampicillin prior to delivery (adequate treatment).    DELIVERY BABY A:  Infant was vigorous at delivery, initially requiring no resuscitation other than standard warming, drying and stimulation.  O2 saturations in high 90s by 5 minutes of age.  APGARs 8 and 9.  Exam notable for mild subcostal retractions, but was otherwise within normal limits.  However, at around 25 minutes of age infant began to have oxygen desaturations, which improved with CPAP +5, 21%.  He was admitted to the NICU on the settings.  DELIVERY BABY B:  Infant was vigorous at delivery, initially requiring no resuscitation other than standard warming, drying and stimulation.  However, O2 saturations only in high 70s by 5 minutes of age, so CPAP +5, 30% was initiated.  Max FiO2 was 40%, and O2 saturations improved to low 90s.  He was admitted on CPAP +5, 30%.  APGARs 8 and 8.    _____________________ Electronically Signed By: Maryan CharLindsey Tajuanna Burnett, MD Neonatologist

## 2018-07-26 NOTE — Anesthesia Procedure Notes (Signed)
Epidural Patient location during procedure: OB Start time: 07/26/2018 8:37 PM End time: 07/26/2018 8:43 PM  Staffing Anesthesiologist: Cecile Hearingurk, Patrycja Mumpower Edward, MD Performed: anesthesiologist   Preanesthetic Checklist Completed: patient identified, pre-op evaluation, timeout performed, IV checked, risks and benefits discussed and monitors and equipment checked  Epidural Patient position: sitting Prep: DuraPrep Patient monitoring: blood pressure and continuous pulse ox Approach: midline Location: L3-L4 Injection technique: LOR air  Needle:  Needle type: Tuohy  Needle gauge: 17 G Needle length: 9 cm Needle insertion depth: 7 cm Catheter size: 19 Gauge Catheter at skin depth: 12 cm Test dose: negative and Other (1% Lidocaine)  Additional Notes Patient identified.  Risk benefits discussed including failed block, incomplete pain control, headache, nerve damage, paralysis, blood pressure changes, nausea, vomiting, reactions to medication both toxic or allergic, and postpartum back pain.  Patient expressed understanding and wished to proceed.  All questions were answered.  Sterile technique used throughout procedure and epidural site dressed with sterile barrier dressing. No paresthesia or other complications noted. The patient did not experience any signs of intravascular injection such as tinnitus or metallic taste in mouth nor signs of intrathecal spread such as rapid motor block. Please see nursing notes for vital signs. Reason for block:procedure for pain

## 2018-07-26 NOTE — Progress Notes (Signed)
Lollie Sailsshley M Sonnenfeld is a 32 y.o. G1P0 at 7637w0d by LMP admitted for preeclampsia with severe features. Had PPROM while hospitalized   Subjective: Called to room by RN around [phone] that she started having increased pain in the 15min prior to being called, around shift change for nurses. They put her on monitor and ctx noted every 1-442min c/w what she was feeling. No VB,  Objective: BP (!) 155/93   Pulse 87   Temp 98.2 F (36.8 C) (Oral)   Resp 18   Ht 5\' 3"  (1.6 m)   Wt 86.6 kg   LMP 12/07/2017 (Exact Date)   SpO2 98%   BMI 33.83 kg/m  No intake/output data recorded. No intake/output data recorded.  FHT: normal baselin, +accels, no decels x2  UC:   5/10 minutes SVE:   Dilation: 8 Effacement (%): 100 Station: 0 Exam by:: Katie Yostin Malacara CNM  On my initial exam upstairs, was 6-7/100/0. OA  Labs: Lab Results  Component Value Date   WBC 11.2 (H) 07/26/2018   HGB 9.3 (L) 07/26/2018   HCT 28.6 (L) 07/26/2018   MCV 91.1 07/26/2018   PLT 213 07/26/2018    Assessment / Plan: Spontaneous labor, progressing normally  Labor: Progressing normally Preeclampsia:  BP stable, procardia XL daily. Not on mag. No s/s severe features Fetal Wellbeing:  Category I Pain Control:  Labor support without medications I/D:  n/a Anticipated MOD:  NSVD  I called Dr Mora ApplPinn who requested that she be called again when pt was 8cm.  [facility], CNM remained at bedside.  She c/o increased pressure and my re-exam was noted above.  I called Dr Mora ApplPinn who was arriving at that time. IV ampicillin was ordered. Aundra MilletMegan would like epidural so anesthesia has been notified - they requested updated labs since hers were done this AM (>6h have lapsed; plt have been >200 since admission). CNM and MD at bedside providing labor support.   Faylene MillionKathleen A Jamilyn Pigeon 07/26/2018, 8:24 PM

## 2018-07-27 ENCOUNTER — Encounter (HOSPITAL_COMMUNITY): Payer: Self-pay | Admitting: *Deleted

## 2018-07-27 LAB — COMPREHENSIVE METABOLIC PANEL
ALT: 14 U/L (ref 0–44)
ANION GAP: 9 (ref 5–15)
AST: 22 U/L (ref 15–41)
Albumin: 2.3 g/dL — ABNORMAL LOW (ref 3.5–5.0)
Alkaline Phosphatase: 105 U/L (ref 38–126)
BUN: 6 mg/dL (ref 6–20)
CALCIUM: 8.3 mg/dL — AB (ref 8.9–10.3)
CO2: 21 mmol/L — AB (ref 22–32)
Chloride: 104 mmol/L (ref 98–111)
Creatinine, Ser: 0.48 mg/dL (ref 0.44–1.00)
GFR calc non Af Amer: >60 mL/min (ref >60)
Glucose, Bld: 80 mg/dL (ref 70–99)
POTASSIUM: 3.5 mmol/L (ref 3.5–5.1)
SODIUM: 134 mmol/L — AB (ref 135–145)
Total Bilirubin: 0.3 mg/dL (ref 0.3–1.2)
Total Protein: 5.6 g/dL — ABNORMAL LOW (ref 6.5–8.1)

## 2018-07-27 LAB — CBC
HCT: 26.9 % — ABNORMAL LOW (ref 36.0–46.0)
Hemoglobin: 9.1 g/dL — ABNORMAL LOW (ref 12.0–15.0)
MCH: 29.9 pg (ref 26.0–34.0)
MCHC: 33.8 g/dL (ref 30.0–36.0)
MCV: 88.5 fL (ref 78.0–100.0)
PLATELETS: 204 10*3/uL (ref 150–400)
RBC: 3.04 MIL/uL — ABNORMAL LOW (ref 3.87–5.11)
RDW: 13.8 % (ref 11.5–15.5)
WBC: 13.9 10*3/uL — ABNORMAL HIGH (ref 4.0–10.5)

## 2018-07-27 LAB — RPR: RPR Ser Ql: NONREACTIVE

## 2018-07-27 NOTE — Progress Notes (Signed)
Post Partum Day 1   Subjective: Patient doing well she has no complaints.  She denies pain, is voiding w/o difficulty. No heavy bleeding.   Objective: Blood pressure (!) 142/92, pulse 77, temperature 98.8 F (37.1 C), temperature source Oral, resp. rate 18, height 5\' 3"  (1.6 m), weight 86.6 kg, last menstrual period 12/07/2017, SpO2 100 %, unknown if currently breastfeeding.  Physical Exam:  General: alert, cooperative and no distress Lochia: appropriate Uterine Fundus: firm DVT Evaluation: No evidence of DVT seen on physical exam. Negative Homan's sign. No cords or calf tenderness.  Recent Labs    07/26/18 2231 07/27/18 0543  HGB 9.8* 9.1*  HCT 29.3* 26.9*    Assessment/Plan: PPD#1 s/p SVD di/ di twins Gestational HTN vs preeclampsia w/ severe features - BPs normal to mild range  Plan for discharge tomorrow  Babies are doing well in NICU    LOS: 5 days   Barbara Marshall Barbara Marshall 07/27/2018, 1:08 PM

## 2018-07-27 NOTE — Anesthesia Postprocedure Evaluation (Signed)
Anesthesia Post Note  Patient: Barbara Marshall  Procedure(s) Performed: AN AD HOC LABOR EPIDURAL     Patient location during evaluation: Mother Baby Anesthesia Type: Epidural Level of consciousness: awake and alert Pain management: pain level controlled Vital Signs Assessment: post-procedure vital signs reviewed and stable Respiratory status: spontaneous breathing, nonlabored ventilation and respiratory function stable Cardiovascular status: stable Postop Assessment: no headache, no backache and epidural receding Anesthetic complications: no    Last Vitals:  Vitals:   07/27/18 0031 07/27/18 0425  BP: (!) 151/79 138/84  Pulse: 81 62  Resp: 16 16  Temp: 36.8 C 36.7 C  SpO2: 100% 100%    Last Pain:  Vitals:   07/27/18 0514  TempSrc:   PainSc: 0-No pain   Pain Goal: Patients Stated Pain Goal: 5 (07/26/18 2055)               Junious SilkGILBERT,Zaira Iacovelli

## 2018-07-27 NOTE — Lactation Note (Signed)
This note was copied from a baby's chart. Lactation Consultation Note; Mom states she is not going to breast feed.  [name] ZOXWR'UToday's Date: 07/27/2018 Reason for consult: Initial assessment   Maternal Data    Feeding    LATCH Score                   Interventions    Lactation Tools Discussed/Used     Consult Status Consult Status: Complete    Pamelia HoitWeeks, Eidan Muellner D 07/27/2018, 8:12 AM

## 2018-07-28 MED ORDER — IBUPROFEN 600 MG PO TABS: 600.0000 mg | ORAL_TABLET | Freq: Four times a day (QID) | ORAL | 0 refills | Status: DC | PRN

## 2018-07-28 MED ORDER — NIFEDIPINE ER 60 MG PO TB24: 60.0000 mg | ORAL_TABLET | Freq: Every day | ORAL | 3 refills | Status: DC

## 2018-07-28 MED ORDER — PNEUMOCOCCAL VAC POLYVALENT 25 MCG/0.5ML IJ INJ: 0.5000 mL | INJECTION | INTRAMUSCULAR | Status: DC

## 2018-07-28 MED ORDER — MEASLES, MUMPS & RUBELLA VAC ~~LOC~~ INJ
0.5000 mL | INJECTION | Freq: Once | SUBCUTANEOUS | Status: AC
  Administered 2018-07-28: 0.5 mL via SUBCUTANEOUS
  Filled 2018-07-28: qty 0.5

## 2018-07-28 NOTE — Discharge Summary (Addendum)
OB Discharge Summary     [name] DOB: August 06, 1986 MRN: [id number]  Date of admission: 07/21/2018 Delivering MD:    Rondel BatonWiseman, BoyA Caliann [[id number]][[id number]]  Clerance LavINN, WALDA   Kirtley, BoyB ChesterAshley [[id number]][[id number]]  Essie HartPINN, WALDA   Date of discharge: 07/28/2018  Admitting diagnosis: 32 WKS, ELEVATED BP Intrauterine pregnancy: 5946w0d     Secondary diagnosis:  Active Problems:   Human immunodeficiency virus (HIV) disease (HCC)   Hypertension affecting pregnancy   Severe preeclampsia, third trimester   Not immune to rubella   Preterm premature rupture of membranes (PPROM) with onset of labor after 24 hours of rupture in third trimester, antepartum   Twin pregnancy, dichorionic/diamniotic, third trimester   Pregnancy     Discharge diagnosis: Preterm Pregnancy Delivered and Preeclampsia (severe)                                                                                                Post partum procedures:n/a  Augmentation: n/a  Complications: ROM>24 hours  Hospital course:  Onset of Labor With Vaginal Delivery     32 y.o. yo G1P0102 at 546w0d was admitted in Active Labor on 07/21/2018. Patient had an uncomplicated labor course as follows:  Membrane Rupture Time/Date:    Rondel BatonWiseman, BoyA Heily [[id number]][[id number]]  3:45 PM   Gilmer MorWiseman, BoyB Yolander [[id number]][[id number]]  9:15 PM ,   Rondel BatonWiseman, BoyA Gavriella [[id number]][[id number]]  07/24/2018   Gilmer MorWiseman, BoyB Shanikka [[id number]][[id number]]  07/24/2018   Intrapartum Procedures: Episiotomy:    Rondel BatonWiseman, BoyA [place] [[id number]][[id number]]  None [1]   Gilmer MorWiseman, BoyB Haddy [[id number]][[id number]]  None [1]                                         Lacerations:     Rondel BatonWiseman, BoyA Tinslee [[id number]][[id number]]  None [1]   Gilmer MorWiseman, BoyB Jasmynn [[id number]][[id number]]  None [1]  Patient had a delivery of two Viable infants.   Rondel BatonWiseman, BoyA Ferris [[id number]][[id number]]  07/26/2018   Gilmer MorWiseman, BoyB Feliciana [[id number]][[id number]]  07/26/2018 Information for the patient's newborn:  Rondel BatonWiseman, BoyA [place] [[id number]][[id number]]  Delivery  Method: Vaginal, Spontaneous(Filed from Delivery Summary) Information for the patient's newborn:  Gilmer MorWiseman, BoyB Humna [[id number]][[id number]]  Delivery Method: Vaginal, Vacuum (Extractor)(Filed from Delivery Summary)    Pateint had an uncomplicated postpartum course.  She is ambulating, tolerating a regular diet, passing flatus, and urinating well. Her blood pressure is controlled with Procardia XL 60mg  QD and she denies any symptoms of preeclampsia such as epigastric pain, headache, or blurry vision. Patient is discharged home in stable condition on 07/28/18.   Physical exam  Vitals:   07/27/18 2001 07/27/18 2305 07/28/18 0408 07/28/18 0739  BP: (!) 157/108 (!) 144/80 128/74 (!) 145/93  Pulse: 85 87 66 69  Resp: 16 18 16 18   Temp: 99 F (37.2 C) 98.2 F (36.8 C) 98.4 F (36.9 C) 98.6 F (37 C)  TempSrc: Oral Oral Oral Oral  SpO2: 98%  98% 99%  Weight:      Height:  General: alert, cooperative and no distress Lochia: appropriate Uterine Fundus: firm Incision: N/A DVT Evaluation: No evidence of DVT seen on physical exam. Negative Homan's sign. No cords or calf tenderness. No significant calf/ankle edema.  Labs: Lab Results  Component Value Date   WBC 13.9 (H) 07/27/2018   HGB 9.1 (L) 07/27/2018   HCT 26.9 (L) 07/27/2018   MCV 88.5 07/27/2018   PLT 204 07/27/2018   CMP Latest Ref Rng & Units 07/27/2018  Glucose 70 - 99 mg/dL 80  BUN 6 - 20 mg/dL 6  Creatinine 1.610.44 - 0.961.00 mg/dL 0.450.48  Sodium 409135 - 811145 mmol/L 134(L)  Potassium 3.5 - 5.1 mmol/L 3.5  Chloride 98 - 111 mmol/L 104  CO2 22 - 32 mmol/L 21(L)  Calcium 8.9 - 10.3 mg/dL 8.3(L)  Total Protein 6.5 - 8.1 g/dL 9.1(Y5.6(L)  Total Bilirubin 0.3 - 1.2 mg/dL 0.3  Alkaline Phos 38 - 126 U/L 105  AST 15 - 41 U/L 22  ALT 0 - 44 U/L 14    Discharge instruction: per After Visit Summary and "Baby and Me Booklet". Patient discharged with preeclampsia precautions.   After visit meds:  Allergies as of 07/28/2018   No Known  Allergies     Medication List    TAKE these medications   abacavir 300 MG tablet Commonly known as:  ZIAGEN Take 600 mg by mouth daily.   albuterol 108 (90 Base) MCG/ACT inhaler Commonly known as:  PROVENTIL HFA;VENTOLIN HFA Inhale 2 puffs into the lungs every 4 (four) hours as needed.   cetirizine 10 MG tablet Commonly known as:  ZYRTEC Take 10 mg by mouth at bedtime.   ibuprofen 600 MG tablet Commonly known as:  ADVIL,MOTRIN Take 1 tablet (600 mg total) by mouth every 6 (six) hours as needed for mild pain or moderate pain.   ISENTRESS 400 MG tablet Generic drug:  raltegravir Take 400 mg by mouth 2 (two) times daily.   lamivudine 300 MG tablet Commonly known as:  EPIVIR Take 300 mg by mouth daily.   NIFEdipine 60 MG 24 hr tablet Commonly known as:  PROCARDIA-XL/ADALAT CC Take 1 tablet (60 mg total) by mouth daily.   PRENATAL ADULT GUMMY/DHA/FA PO Take 2 each by mouth daily.       Diet: routine diet  Activity: Advance as tolerated. Pelvic rest for 6 weeks.   Outpatient follow up:1 week for blood pressure check, 6 weeks for postpartum visit Follow up Appt:No future appointments. Follow up Visit:No follow-ups on file.  Postpartum contraception: Combination OCPs, patient has supply at home and does not need Rx at this time.   Newborn Data:   Rondel BatonWiseman, BoyA Niyati [[id number]][[id number]]  Live born female  Birth Weight: 3 lb 14.1 oz (1760 g) APGAR: 8, 9  Newborn Delivery   Birth date/time:  07/26/2018 21:12:00 Delivery type:  Vaginal, Spontaneous      Gilmer MorWiseman, BoyB Jowanda [[id number]][[id number]]  Live born female  Birth Weight: 4 lb 6.9 oz (2010 g) APGAR: 8, 8  Newborn Delivery   Birth date/time:  07/26/2018 21:23:00 Delivery type:  Vaginal, Vacuum (Extractor)     Baby Feeding: Bottle Disposition:NICU   07/28/2018 Janeece RiggersEllis K [address] Jon, CNM

## 2018-07-28 NOTE — Discharge Instructions (Signed)
Postpartum Care After Vaginal Delivery ° °The period of time right after you deliver your newborn is called the postpartum period. °What kind of medical care will I receive? °· You may continue to receive fluids and medicines through an IV tube inserted into one of your veins. °· If an incision was made near your vagina (episiotomy) or if you had some vaginal tearing during delivery, cold compresses may be placed on your episiotomy or your tear. This helps to reduce pain and swelling. °· You may be given a squirt bottle to use when you go to the bathroom. You may use this until you are comfortable wiping as usual. To use the squirt bottle, follow these steps: °? Before you urinate, fill the squirt bottle with warm water. Do not use hot water. °? After you urinate, while you are sitting on the toilet, use the squirt bottle to rinse the area around your urethra and vaginal opening. This rinses away any urine and blood. °? You may do this instead of wiping. As you start healing, you may use the squirt bottle before wiping yourself. Make sure to wipe gently. °? Fill the squirt bottle with clean water every time you use the bathroom. °· You will be given sanitary pads to wear. °How can I expect to feel? °· You may not feel the need to urinate for several hours after delivery. °· You will have some soreness and pain in your abdomen and vagina. °· If you are breastfeeding, you may have uterine contractions every time you breastfeed for up to several weeks postpartum. Uterine contractions help your uterus return to its normal size. °· It is normal to have vaginal bleeding (lochia) after delivery. The amount and appearance of lochia is often similar to a menstrual period in the first week after delivery. It will gradually decrease over the next few weeks to a dry, yellow-brown discharge. For most women, lochia stops completely by 6-8 weeks after delivery. Vaginal bleeding can vary from woman to woman. °· Within the first few  days after delivery, you may have breast engorgement. This is when your breasts feel heavy, full, and uncomfortable. Your breasts may also throb and feel hard, tightly stretched, warm, and tender. After this occurs, you may have milk leaking from your breasts. Your health care provider can help you relieve discomfort due to breast engorgement. Breast engorgement should go away within a few days. °· You may feel more sad or worried than normal due to hormonal changes after delivery. These feelings should not last more than a few days. If these feelings do not go away after several days, speak with your health care provider. °How should I care for myself? °· Tell your health care provider if you have pain or discomfort. °· Drink enough water to keep your urine clear or pale yellow. °· Wash your hands thoroughly with soap and water for at least 20 seconds after changing your sanitary pads, after using the toilet, and before holding or feeding your baby. °· If you are not breastfeeding, avoid touching your breasts a lot. Doing this can make your breasts produce more milk. °· If you become weak or lightheaded, or you feel like you might faint, ask for help before: °? Getting out of bed. °? Showering. °· Change your sanitary pads frequently. Watch for any changes in your flow, such as a sudden increase in volume, a change in color, the passing of large blood clots. If you pass a blood clot from your vagina,   save it to show to your health care provider. Do not flush blood clots down the toilet without having your health care provider look at them.  Make sure that all your vaccinations are up to date. This can help protect you and your baby from getting certain diseases. You may need to have immunizations done before you leave the hospital.  If desired, talk with your health care provider about methods of family planning or birth control (contraception). How can I start bonding with my baby? Spending as much time as  possible with your baby is very important. During this time, you and your baby can get to know each other and develop a bond. Having your baby stay with you in your room (rooming in) can give you time to get to know your baby. Rooming in can also help you become comfortable caring for your baby. Breastfeeding can also help you bond with your baby. How can I plan for returning home with my baby?  Make sure that you have a car seat installed in your vehicle. ? Your car seat should be checked by a certified car seat installer to make sure that it is installed safely. ? Make sure that your baby fits into the car seat safely.  Ask your health care provider any questions you have about caring for yourself or your baby. Make sure that you are able to contact your health care provider with any questions after leaving the hospital. This information is not intended to replace advice given to you by your health care provider. Make sure you discuss any questions you have with your health care provider. Document Released: 09/21/2007 Document Revised: 04/28/2016 Document Reviewed: 10/29/2015 Elsevier Interactive Patient Education  2018 [employer].   Postpartum Depression and Baby Blues The postpartum period begins right after the birth of a baby. During this time, there is often a great amount of joy and excitement. It is also a time of many changes in the life of the parents. Regardless of how many times a mother gives birth, each child brings new challenges and dynamics to the family. It is not unusual to have feelings of excitement along with confusing shifts in moods, emotions, and thoughts. All mothers are at risk of developing postpartum depression or the "baby blues." These mood changes can occur right after giving birth, or they may occur many months after giving birth. The baby blues or postpartum depression can be mild or severe. Additionally, postpartum depression can go away rather quickly, or it can  be a long-term condition. What are the causes? Raised hormone levels and the rapid drop in those levels are thought to be a main cause of postpartum depression and the baby blues. A number of hormones change during and after pregnancy. Estrogen and progesterone usually decrease right after the delivery of your baby. The levels of thyroid hormone and various cortisol steroids also rapidly drop. Other factors that play a role in these mood changes include major life events and genetics. What increases the risk? If you have any of the following risks for the baby blues or postpartum depression, know what symptoms to watch out for during the postpartum period. Risk factors that may increase the likelihood of getting the baby blues or postpartum depression include:  Having a personal or family history of depression.  Having depression while being pregnant.  Having premenstrual mood issues or mood issues related to oral contraceptives.  Having a lot of life stress.  Having marital conflict.  Lacking  a social support network.  Having a baby with special needs.  Having health problems, such as diabetes.  What are the signs or symptoms? Symptoms of baby blues include:  Brief changes in mood, such as going from extreme happiness to sadness.  Decreased concentration.  Difficulty sleeping.  Crying spells, tearfulness.  Irritability.  Anxiety.  Symptoms of postpartum depression typically begin within the first month after giving birth. These symptoms include:  Difficulty sleeping or excessive sleepiness.  Marked weight loss.  Agitation.  Feelings of worthlessness.  Lack of interest in activity or food.  Postpartum psychosis is a very serious condition and can be dangerous. Fortunately, it is rare. Displaying any of the following symptoms is cause for immediate medical attention. Symptoms of postpartum psychosis include:  Hallucinations and delusions.  Bizarre or disorganized  behavior.  Confusion or disorientation.  How is this diagnosed? A diagnosis is made by an evaluation of your symptoms. There are no medical or lab tests that lead to a diagnosis, but there are various questionnaires that a health care provider may use to identify those with the baby blues, postpartum depression, or psychosis. Often, a screening tool called the [place] Postnatal Depression Scale is used to diagnose depression in the postpartum period. How is this treated? The baby blues usually goes away on its own in 1-2 weeks. Social support is often all that is needed. You will be encouraged to get adequate sleep and rest. Occasionally, you may be given medicines to help you sleep. Postpartum depression requires treatment because it can last several months or longer if it is not treated. Treatment may include individual or group therapy, medicine, or both to address any social, physiological, and psychological factors that may play a role in the depression. Regular exercise, a healthy diet, rest, and social support may also be strongly recommended. Postpartum psychosis is more serious and needs treatment right away. Hospitalization is often needed. Follow these instructions at home:  Get as much rest as you can. Nap when the baby sleeps.  Exercise regularly. Some women find yoga and walking to be beneficial.  Eat a balanced and nourishing diet.  Do little things that you enjoy. Have a cup of tea, take a bubble bath, read your favorite magazine, or listen to your favorite music.  Avoid alcohol.  Ask for help with household chores, cooking, grocery shopping, or running errands as needed. Do not try to do everything.  Talk to people close to you about how you are feeling. Get support from your partner, family members, friends, or other new moms.  Try to stay positive in how you think. Think about the things you are grateful for.  Do not spend a lot of time alone.  Only take  over-the-counter or prescription medicine as directed by your health care provider.  Keep all your postpartum appointments.  Let your health care provider know if you have any concerns. Contact a health care provider if: You are having a reaction to or problems with your medicine. Get help right away if:  You have suicidal feelings.  You think you may harm the baby or someone else. This information is not intended to replace advice given to you by your health care provider. Make sure you discuss any questions you have with your health care provider. Document Released: 08/28/2004 Document Revised: 05/01/2016 Document Reviewed: 09/05/2013 Elsevier Interactive Patient Education  2017 [employer].   Contraception Choices Contraception, also called birth control, means things to use or ways to try  not to get pregnant. °Hormonal birth control °This kind of birth control uses hormones. Here are some types of hormonal birth control: °· A tube that is put under skin of the arm (implant). The tube can stay in for as long as 3 years. °· Shots to get every 3 months (injections). °· Pills to take every day (birth control pills). °· A patch to change 1 time each week for 3 weeks (birth control patch). After that, the patch is taken off for 1 week. °· A ring to put in the vagina. The ring is left in for 3 weeks. Then it is taken out of the vagina for 1 week. Then a new ring is put in. °· Pills to take after unprotected sex (emergency birth control pills). ° °Barrier birth control °Here are some types of barrier birth control: °· A thin covering that is put on the penis before sex (female condom). The covering is thrown away after sex. °· A soft, loose covering that is put in the vagina before sex (female condom). The covering is thrown away after sex. °· A rubber bowl that sits over the cervix (diaphragm). The bowl must be made for you. The bowl is put into the vagina before sex. The bowl is left in for 6-8 hours  after sex. It is taken out within 24 hours. °· A small, soft cup that fits over the cervix (cervical cap). The cup must be made for you. The cup can be left in for 6-8 hours after sex. It is taken out within 48 hours. °· A sponge that is put into the vagina before sex. It must be left in for at least 6 hours after sex. It must be taken out within 30 hours. Then it is thrown away. °· A chemical that kills or stops sperm from getting into the uterus (spermicide). It may be a pill, cream, jelly, or foam to put in the vagina. The chemical should be used at least 10-15 minutes before sex. ° °IUD (intrauterine) birth control °An IUD is a small, T-shaped piece of plastic. It is put inside the uterus. There are two kinds: °· Hormone IUD. This kind can stay in for 3-5 years. °· Copper IUD. This kind can stay in for 10 years. ° °Permanent birth control °Here are some types of permanent birth control: °· Surgery to block the fallopian tubes. °· Having an insert put into each fallopian tube. °· Surgery to tie off the tubes that carry sperm (vasectomy). ° °Natural planning birth control °Here are some types of natural planning birth control: °· Not having sex on the days the woman could get pregnant. °· Using a calendar: °? To keep track of the length of each period. °? To find out what days pregnancy can happen. °? To plan to not have sex on days when pregnancy can happen. °· Watching for symptoms of ovulation and not having sex during ovulation. One way the woman can check for ovulation is to check her temperature. °· Waiting to have sex until after ovulation. ° °Summary °· Contraception, also called birth control, means things to use or ways to try not to get pregnant. °· Hormonal methods of birth control include implants, injections, pills, patches, vaginal rings, and emergency birth control pills. °· Barrier methods of birth control can include female condoms, female condoms, diaphragms, cervical caps, sponges, and  spermicides. °· There are two types of IUD (intrauterine device) birth control. An IUD can be put in a woman's uterus   to prevent pregnancy for 3-5 years.  Permanent sterilization can be done through a procedure for males, females, or both.  Natural planning methods involve not having sex on the days when the woman could get pregnant. This information is not intended to replace advice given to you by your health care provider. Make sure you discuss any questions you have with your health care provider. Document Released: 09/21/2009 Document Revised: 12/04/2016 Document Reviewed: 12/04/2016 Elsevier Interactive Patient Education  2017 [employer]  Preeclampsia and Eclampsia Preeclampsia is a serious condition that develops only during pregnancy. It is also called toxemia of pregnancy. This condition causes high blood pressure along with other symptoms, such as swelling and headaches. These symptoms may develop as the condition gets worse. Preeclampsia may occur at 20 weeks of pregnancy or later. Diagnosing and treating preeclampsia early is very important. If not treated early, it can cause serious problems for you and your baby. One problem it can lead to is eclampsia, which is a condition that causes muscle jerking or shaking (convulsions or seizures) in the mother. Delivering your baby is the best treatment for preeclampsia or eclampsia. Preeclampsia and eclampsia symptoms usually go away after your baby is born. What are the causes? The cause of preeclampsia is not known. What increases the risk? The following risk factors make you more likely to develop preeclampsia:  Being pregnant for the first time.  Having had preeclampsia during a past pregnancy.  Having a family history of preeclampsia.  Having high blood pressure.  Being pregnant with twins or triplets.  Being 3535 or older.  Being African-American.  Having kidney disease or diabetes.  Having medical conditions such as  lupus or blood diseases.  Being very overweight (obese).  What are the signs or symptoms? The earliest signs of preeclampsia are:  High blood pressure.  Increased protein in your urine. Your health care provider will check for this at every visit before you give birth (prenatal visit).  Other symptoms that may develop as the condition gets worse include:  Severe headaches.  Sudden weight gain.  Swelling of the hands, face, legs, and feet.  Nausea and vomiting.  Vision problems, such as blurred or double vision.  Numbness in the face, arms, legs, and feet.  Urinating less than usual.  Dizziness.  Slurred speech.  Abdominal pain, especially upper abdominal pain.  Convulsions or seizures.  Symptoms generally go away after giving birth. How is this diagnosed? There are no screening tests for preeclampsia. Your health care provider will ask you about symptoms and check for signs of preeclampsia during your prenatal visits. You may also have tests that include:  Urine tests.  Blood tests.  Checking your blood pressure.  Monitoring your babys heart rate.  Ultrasound.  How is this treated? You and your health care provider will determine the treatment approach that is best for you. Treatment may include:  Having more frequent prenatal exams to check for signs of preeclampsia, if you have an increased risk for preeclampsia.  Bed rest.  Reducing how much salt (sodium) you eat.  Medicine to lower your blood pressure.  Staying in the hospital, if your condition is severe. There, treatment will focus on controlling your blood pressure and the amount of fluids in your body (fluid retention).  You may need to take medicine (magnesium sulfate) to prevent seizures. This medicine may be given as an injection or through an IV tube.  Delivering your baby early, if your condition gets worse. You may  have your labor started with medicine (induced), or you may have a  cesarean delivery.  Follow these instructions at home: Eating and drinking   Drink enough fluid to keep your urine clear or pale yellow.  Eat a healthy diet that is low in sodium. Do not add salt to your food. Check nutrition labels to see how much sodium a food or beverage contains.  Avoid caffeine. Lifestyle  Do not use any products that contain nicotine or tobacco, such as cigarettes and e-cigarettes. If you need help quitting, ask your health care provider.  Do not use alcohol or drugs.  Avoid stress as much as possible. Rest and get plenty of sleep. General instructions  Take over-the-counter and prescription medicines only as told by your health care provider.  When lying down, lie on your side. This keeps pressure off of your baby.  When sitting or lying down, raise (elevate) your feet. Try putting some pillows underneath your lower legs.  Exercise regularly. Ask your health care provider what kinds of exercise are best for you.  Keep all follow-up and prenatal visits as told by your health care provider. This is important. How is this prevented? To prevent preeclampsia or eclampsia from developing during another pregnancy:  Get proper medical care during pregnancy. Your health care provider may be able to prevent preeclampsia or diagnose and treat it early.  Your health care provider may have you take a low-dose aspirin or a calcium supplement during your next pregnancy.  You may have tests of your blood pressure and kidney function after giving birth.  Maintain a healthy weight. Ask your health care provider for help managing weight gain during pregnancy.  Work with your health care provider to manage any long-term (chronic) health conditions you have, such as diabetes or kidney problems.  Contact a health care provider if:  You gain more weight than expected.  You have headaches.  You have nausea or vomiting.  You have abdominal pain.  You feel dizzy or  light-headed. Get help right away if:  You develop sudden or severe swelling anywhere in your body. This usually happens in the legs.  You gain 5 lbs (2.3 kg) or more during one week.  You have severe: ? Abdominal pain. ? Headaches. ? Dizziness. ? Vision problems. ? Confusion. ? Nausea or vomiting.  You have a seizure.  You have trouble moving any part of your body.  You develop numbness in any part of your body.  You have trouble speaking.  You have any abnormal bleeding.  You pass out. This information is not intended to replace advice given to you by your health care provider. Make sure you discuss any questions you have with your health care provider. Document Released: 11/21/2000 Document Revised: 07/22/2016 Document Reviewed: 06/30/2016 Elsevier Interactive Patient Education  [employer]2018 [employer]

## 2018-07-30 NOTE — Progress Notes (Signed)
Addendum to Delivery note from 07/26/18 Baby Boy A was VIABLE  Error was in the statement non viable  Veanna Dower STACIA

## 2018-08-02 DIAGNOSIS — R030 Elevated blood-pressure reading, without diagnosis of hypertension: Secondary | ICD-10-CM | POA: Diagnosis not present

## 2018-09-14 ENCOUNTER — Encounter: Payer: Self-pay | Admitting: Allergy & Immunology

## 2018-09-14 ENCOUNTER — Ambulatory Visit (INDEPENDENT_AMBULATORY_CARE_PROVIDER_SITE_OTHER): Payer: BLUE CROSS/BLUE SHIELD | Admitting: Allergy & Immunology

## 2018-09-14 VITALS — BP 146/86 | HR 78 | Temp 98.1°F | Ht 64.2 in | Wt 171.8 lb

## 2018-09-14 DIAGNOSIS — J310 Chronic rhinitis: Secondary | ICD-10-CM | POA: Diagnosis not present

## 2018-09-14 DIAGNOSIS — J302 Other seasonal allergic rhinitis: Secondary | ICD-10-CM | POA: Diagnosis not present

## 2018-09-14 DIAGNOSIS — J3089 Other allergic rhinitis: Secondary | ICD-10-CM

## 2018-09-14 DIAGNOSIS — J4520 Mild intermittent asthma, uncomplicated: Secondary | ICD-10-CM | POA: Insufficient documentation

## 2018-09-14 DIAGNOSIS — T781XXD Other adverse food reactions, not elsewhere classified, subsequent encounter: Secondary | ICD-10-CM | POA: Diagnosis not present

## 2018-09-14 HISTORY — DX: Mild intermittent asthma, uncomplicated: J45.20

## 2018-09-14 HISTORY — DX: Other seasonal allergic rhinitis: J30.2

## 2018-09-14 MED ORDER — AZELASTINE HCL 0.1 % NA SOLN: 2.0000 | Freq: Two times a day (BID) | NASAL | 5 refills | Status: DC

## 2018-09-14 MED ORDER — LEVOCETIRIZINE DIHYDROCHLORIDE 5 MG PO TABS: 5.0000 mg | ORAL_TABLET | Freq: Every evening | ORAL | 5 refills | Status: DC

## 2018-09-14 NOTE — Progress Notes (Signed)
NEW PATIENT  Date of Service/Encounter:  09/14/18  Referring provider: Merlyn Albert, MD   Assessment:   Mild intermittent asthma without complication  Seasonal and perennial allergic rhinitis (mouse, horse, trees, weeds, grasses, indoor molds, outdoor molds, cat and dog)  Adverse food reaction (cow's milk) - with negative testing, but seems to be dose dependent  Plan/Recommendations:   1. Mild intermittent asthma without complication - Lung testing looked good today. - We will not put on any daily medications for asthma. - Continue with albuterol 2-4 puffs every 4-6 hours as needed for coughing/wheezing/shortness of breath.   2. Chronic rhinitis - Testing today showed: mouse, horse, trees, weeds, grasses, indoor molds, outdoor molds, cat and dog - Avoidance measures provided. - Stop the liquid medication that you are currently taking.  - Start taking: Xyzal (levocetirizine) 5mg  tablet once daily, Flonase (fluticasone) one spray per nostril daily and Astelin (azelastine) 2 sprays per nostril 1-2 times daily as needed - You can use an extra dose of the antihistamine, if needed, for breakthrough symptoms.  - Consider nasal saline rinses 1-2 times daily to remove allergens from the nasal cavities as well as help with mucous clearance (this is especially helpful to do before the nasal sprays are given) - Consider allergy shots as a means of long-term control. - Allergy shots "re-train" and "reset" the immune system to ignore environmental allergens and decrease the resulting immune response to those allergens (sneezing, itchy watery eyes, runny nose, nasal congestion, etc).  - Allergy shots improve symptoms in 75-85% of patients.  - We can discuss more at the next appointment if the medications are not working for you.   3. Adverse food reaction (milk) - Testing was negative to milk today. - This testing rules out IgE-mediated anaphylaxis reactions, but does not rule out other  immune mediated reactions. - Reassuringly, these other reactions are not life threatening.  - Symptoms also seem to be dose dependent, so I instructed Barbara Marshall to modulate her ingestion of cow's milk in order to control symptoms.   4. Return in about 6 weeks (around 10/26/2018).  Subjective:   Barbara Marshall is a 32 y.o. female presenting today for evaluation of  Chief Complaint  Patient presents with  . Allergic Reaction    Sneezing    Barbara Marshall has a history of the following: Patient Active Problem List   Diagnosis Date Noted  . Seasonal and perennial allergic rhinitis 09/14/2018  . Mild intermittent asthma without complication 09/14/2018  . Not immune to rubella 07/26/2018  . Preterm premature rupture of membranes (PPROM) with onset of labor after 24 hours of rupture in third trimester, antepartum 07/26/2018  . Twin pregnancy, dichorionic/diamniotic, third trimester 07/26/2018  . Pregnancy 07/26/2018  . Severe preeclampsia, third trimester 07/22/2018  . Hypertension affecting pregnancy 07/21/2018  . Human immunodeficiency virus infection (HCC) 01/09/2018  . Anxiety 11/29/2016  . Human immunodeficiency virus (HIV) disease (HCC) 11/29/2016    History obtained from: chart review and patient.  Barbara Marshall was referred by Merlyn Albert, MD.     Barbara Marshall is a 32 y.o. female presenting to re-establish care. Barbara Marshall was previously seen by Dr. Lucie Leather. Barbara Marshall had an inhaler at that time and was put on some over the counter medication. Barbara Marshall was never on shots and only went to see him around twice.   Asthma/Respiratory Symptom History: Barbara Marshall does have a history of asthma diagnosed by Dr. Lucie Leather. Barbara Marshall was given an albuterol inhaler. Barbara Marshall  was not placed on a daily medication to her knowledge. Barbara Marshall reports that Barbara Marshall would have difficulty breathing with tightness. This tended to be animal induced.   Allergic Rhinitis Symptom History: Barbara Marshall does develops hives with exposures to animals.  Barbara Marshall reports that Barbara Marshall sneezes around 100 times per day. Barbara Marshall is taking [facility] Allergy at night (generic Benadryl). Barbara Marshall has tried Careers adviser and Claritin without improvement. Barbara Marshall does get itchy watery eyes and has some eye drops. Barbara Marshall does have Zatidor to use as needed.   Food Allergy Symptom History: Barbara Marshall does have itching on her throat with exposure to cow's milk, including yogurt and cheese. Barbara Marshall can tolerate baked milk without a problem. Barbara Marshall otherwise tolerates all of the major food allergens without adverse event.   Otherwise, there is no history of other atopic diseases, including environmental allergies or stinging insect allergies. There is no significant infectious history. Vaccinations are up to date.    Past Medical History: Patient Active Problem List   Diagnosis Date Noted  . Seasonal and perennial allergic rhinitis 09/14/2018  . Mild intermittent asthma without complication 09/14/2018  . Not immune to rubella 07/26/2018  . Preterm premature rupture of membranes (PPROM) with onset of labor after 24 hours of rupture in third trimester, antepartum 07/26/2018  . Twin pregnancy, dichorionic/diamniotic, third trimester 07/26/2018  . Pregnancy 07/26/2018  . Severe preeclampsia, third trimester 07/22/2018  . Hypertension affecting pregnancy 07/21/2018  . Human immunodeficiency virus infection (HCC) 01/09/2018  . Anxiety 11/29/2016  . Human immunodeficiency virus (HIV) disease (HCC) 11/29/2016    Medication List:  Allergies as of 09/14/2018   No Known Allergies     Medication List        Accurate as of 09/14/18 11:59 PM. Always use your most recent med list.          abacavir 300 MG tablet Commonly known as:  ZIAGEN abacavir 300 mg tablet  TAKE 2 TABLETS (600 MG TOTAL) BY MOUTH DAILY.   albuterol 108 (90 Base) MCG/ACT inhaler Commonly known as:  PROVENTIL HFA;VENTOLIN HFA Inhale 2 puffs into the lungs every 4 (four) hours as needed.   azelastine 0.1 % nasal  spray Commonly known as:  ASTELIN Place 2 sprays into both nostrils 2 (two) times daily.   cetirizine 10 MG tablet Commonly known as:  ZYRTEC Take 10 mg by mouth at bedtime.   ibuprofen 600 MG tablet Commonly known as:  ADVIL,MOTRIN Take 1 tablet (600 mg total) by mouth every 6 (six) hours as needed for mild pain or moderate pain.   ISENTRESS 400 MG tablet Generic drug:  raltegravir Take 400 mg by mouth 2 (two) times daily.   lamivudine 300 MG tablet Commonly known as:  EPIVIR Take 300 mg by mouth daily.   levocetirizine 5 MG tablet Commonly known as:  XYZAL Take 1 tablet (5 mg total) by mouth every evening.   PRENATAL ADULT GUMMY/DHA/FA PO Take 2 each by mouth daily.       Birth History: non-contributory  Developmental History: non-contributory.   Past Surgical History: Past Surgical History:  Procedure Laterality Date  . WISDOM TOOTH EXTRACTION       Family History: Family History  Problem Relation Age of Onset  . Cancer Father        BRAIN  . Diabetes Father   . Asthma Father   . Thyroid disease Mother   . Migraines Mother   . Allergic rhinitis Mother   . Eczema Neg Hx   . Urticaria  Neg Hx      Social History: Sequoyah lives at home with her family. Barbara Marshall works as a Haematologist.  They live in a house that is 53-year-old.  There is laminate in the main living areas as well as the bedrooms.  They have electric heating and central cooling.  There is a [place] outside as well as a blood hound outside.  There is a hamster inside.  There are no dust mite covers on the bedding.  There is no tobacco exposure.    Review of Systems: a 14-point review of systems is pertinent for what is mentioned in HPI.  Otherwise, all other systems were negative. Constitutional: negative other than that listed in the HPI Eyes: negative other than that listed in the HPI Ears, nose, mouth, throat, and face: negative other than that listed in the HPI Respiratory: negative  other than that listed in the HPI Cardiovascular: negative other than that listed in the HPI Gastrointestinal: negative other than that listed in the HPI Genitourinary: negative other than that listed in the HPI Integument: negative other than that listed in the HPI Hematologic: negative other than that listed in the HPI Musculoskeletal: negative other than that listed in the HPI Neurological: negative other than that listed in the HPI Allergy/Immunologic: negative other than that listed in the HPI    Objective:   Blood pressure (!) 146/86, pulse 78, temperature 98.1 F (36.7 C), temperature source Oral, height 5' 4.2" (1.631 m), weight 171 lb 12.8 oz (77.9 kg), last menstrual period 12/07/2017, SpO2 98 %, unknown if currently breastfeeding. Body mass index is 29.31 kg/m.   Physical Exam:  General: Alert, interactive, in no acute distress. Pleasant female.  Eyes: No conjunctival injection bilaterally, no discharge on the right, no discharge on the left, no Horner-Trantas dots present and allergic shiners present bilaterally. PERRL bilaterally. EOMI without pain. No photophobia.  Ears: Right TM pearly gray with normal light reflex, Left TM pearly gray with normal light reflex, Right TM intact without perforation and Left TM intact without perforation.  Nose/Throat: External nose within normal limits and septum midline. Turbinates edematous and pale with clear discharge. Posterior oropharynx erythematous with cobblestoning in the posterior oropharynx. Tonsils 2+ without exudates.  Tongue without thrush. Neck: Supple without thyromegaly. Trachea midline. Adenopathy: shoddy bilateral anterior cervical lymphadenopathy and no enlarged lymph nodes appreciated in the occipital, axillary, epitrochlear, inguinal, or popliteal regions. Lungs: Clear to auscultation without wheezing, rhonchi or rales. No increased work of breathing. CV: Normal S1/S2. No murmurs. Capillary refill <2 seconds.   Abdomen: Nondistended, nontender. No guarding or rebound tenderness. Bowel sounds present in all fields and hyperactive  Skin: Warm and dry, without lesions or rashes. Extremities:  No clubbing, cyanosis or edema. Neuro:   Grossly intact. No focal deficits appreciated. Responsive to questions.  Diagnostic studies:   Spirometry: results normal (FEV1: 3.07/120%, FVC: 4.21/131%, FEV1/FVC: 72%).    Spirometry consistent with normal pattern.   Allergy Studies:   Indoor/Outdoor Percutaneous Adult Environmental Panel: positive to bahia grass, [place] grass, johnson grass, [place] blue grass, meadow fescue grass, perennial rye grass, sweet vernal grass, timothy grass, cocklebur, English plantain, common mugwort, birch, American beech, red cedar, hickory, Alternaria, cat, dog, horse and mouse. Otherwise negative with adequate controls.  Indoor/Outdoor Selected Intradermal Environmental Panel: positive to mold mix #2, mold mix #3 and mold mix #4. Otherwise negative with adequate controls.  Selected Foods Panel: negative to milk and casein with adequate controls.   Allergy testing results  were read and interpreted by myself, documented by clinical staff.       Salvatore Marvel, MD [facility] [place] [place] [place]

## 2018-09-14 NOTE — Patient Instructions (Addendum)
1. Mild intermittent asthma without complication - Lung testing looked good today. - We will not put on any daily medications for asthma. - Continue with albuterol 2-4 puffs every 4-6 hours as needed for coughing/wheezing/shortness of breath.   2. Chronic rhinitis - Testing today showed: mouse, horse, trees, weeds, grasses, indoor molds, outdoor molds, cat and dog - Avoidance measures provided. - Stop the liquid medication that you are currently taking.  - Start taking: Xyzal (levocetirizine) 5mg  tablet once daily, Flonase (fluticasone) one spray per nostril daily and Astelin (azelastine) 2 sprays per nostril 1-2 times daily as needed - You can use an extra dose of the antihistamine, if needed, for breakthrough symptoms.  - Consider nasal saline rinses 1-2 times daily to remove allergens from the nasal cavities as well as help with mucous clearance (this is especially helpful to do before the nasal sprays are given) - Consider allergy shots as a means of long-term control. - Allergy shots "re-train" and "reset" the immune system to ignore environmental allergens and decrease the resulting immune response to those allergens (sneezing, itchy watery eyes, runny nose, nasal congestion, etc).    - Allergy shots improve symptoms in 75-85% of patients.  - We can discuss more at the next appointment if the medications are not working for you.   3. Return in about 6 weeks (around 10/26/2018).   Please inform us of any Emergency Department visits, hospitalizations, or changes in symptoms. Call us before going to the ED for breathing or allergy symptoms since we might be able to fit you in for a sick visit. Feel free to contact us anytime with any questions, problems, or concerns.  It was a pleasure to meet you today!  Websites that have reliable patient information: 1. American Academy of Asthma, Allergy, and Immunology: www.aaaai.org 2. Food Allergy Research and Education (FARE): foodallergy.org 3.  Mothers of Asthmatics: http://www.asthmacommunitynetwork.org 4. American College of Allergy, Asthma, and Immunology: MissingWeapons.ca   Make sure you are registered to vote! If you have moved or changed any of your contact information, you will need to get this updated before voting!     Reducing Pollen Exposure  The American Academy of Allergy, Asthma and Immunology suggests the following steps to reduce your exposure to pollen during allergy seasons.    1. Do not hang sheets or clothing out to dry; pollen may collect on these items. 2. Do not mow lawns or spend time around freshly cut grass; mowing stirs up pollen. 3. Keep windows closed at night.  Keep car windows closed while driving. 4. Minimize morning activities outdoors, a time when pollen counts are usually at their highest. 5. Stay indoors as much as possible when pollen counts or humidity is high and on windy days when pollen tends to remain in the air longer. 6. Use air conditioning when possible.  Many air conditioners have filters that trap the pollen spores. 7. Use a HEPA room air filter to remove pollen form the indoor air you breathe.  Control of Mold Allergen   Mold and fungi can grow on a variety of surfaces provided certain temperature and moisture conditions exist.  Outdoor molds grow on plants, decaying vegetation and soil.  The major outdoor mold, Alternaria and Cladosporium, are found in very high numbers during hot and dry conditions.  Generally, a late Summer - Fall peak is seen for common outdoor fungal spores.  Rain will temporarily lower outdoor mold spore count, but counts rise rapidly when the rainy period ends.  The most important indoor molds are Aspergillus and Penicillium.  Dark, humid and poorly ventilated basements are ideal sites for mold growth.  The next most common sites of mold growth are the bathroom and the kitchen.  Outdoor (Seasonal) Mold Control  Positive outdoor molds via skin testing:  Alternaria, Bipolaris (Helminthsporium), Drechslera (Curvalaria) and Mucor  1. Use air conditioning and keep windows closed 2. Avoid exposure to decaying vegetation. 3. Avoid leaf raking. 4. Avoid grain handling. 5. Consider wearing a face mask if working in moldy areas.  6.   Indoor (Perennial) Mold Control   Positive indoor molds via skin testing: Aspergillus, Penicillium, Fusarium, Aureobasidium (Pullulara) and Rhizopus  1. Maintain humidity below 50%. 2. Clean washable surfaces with 5% bleach solution. 3. Remove sources e.g. contaminated carpets.     Control of Dog or Cat Allergen  Avoidance is the best way to manage a dog or cat allergy. If you have a dog or cat and are allergic to dog or cats, consider removing the dog or cat from the home. If you have a dog or cat but don't want to find it a new home, or if your family wants a pet even though someone in the household is allergic, here are some strategies that may help keep symptoms at bay:  1. Keep the pet out of your bedroom and restrict it to only a few rooms. Be advised that keeping the dog or cat in only one room will not limit the allergens to that room. 2. Don't pet, hug or kiss the dog or cat; if you do, wash your hands with soap and water. 3. High-efficiency particulate air (HEPA) cleaners run continuously in a bedroom or living room can reduce allergen levels over time. 4. Regular use of a high-efficiency vacuum cleaner or a central vacuum can reduce allergen levels. 5. Giving your dog or cat a bath at least once a week can reduce airborne allergen.   Allergy Shots   Allergies are the result of a chain reaction that starts in the immune system. Your immune system controls how your body defends itself. For instance, if you have an allergy to pollen, your immune system identifies pollen as an invader or allergen. Your immune system overreacts by producing antibodies called Immunoglobulin E (IgE). These antibodies  travel to cells that release chemicals, causing an allergic reaction.  The concept behind allergy immunotherapy, whether it is received in the form of shots or tablets, is that the immune system can be desensitized to specific allergens that trigger allergy symptoms. Although it requires time and patience, the payback can be long-term relief.  How Do Allergy Shots Work?  Allergy shots work much like a vaccine. Your body responds to injected amounts of a particular allergen given in increasing doses, eventually developing a resistance and tolerance to it. Allergy shots can lead to decreased, minimal or no allergy symptoms.  There generally are two phases: build-up and maintenance. Build-up often ranges from three to six months and involves receiving injections with increasing amounts of the allergens. The shots are typically given once or twice a week, though more rapid build-up schedules are sometimes used.  The maintenance phase begins when the most effective dose is reached. This dose is different for each person, depending on how allergic you are and your response to the build-up injections. Once the maintenance dose is reached, there are longer periods between injections, typically two to four weeks.  Occasionally doctors give cortisone-type shots that can temporarily reduce allergy  symptoms. These types of shots are different and should not be confused with allergy immunotherapy shots.  Who Can Be Treated with Allergy Shots?  Allergy shots may be a good treatment approach for people with allergic rhinitis (hay fever), allergic asthma, conjunctivitis (eye allergy) or stinging insect allergy.   Before deciding to begin allergy shots, you should consider:  . The length of allergy season and the severity of your symptoms . Whether medications and/or changes to your environment can control your symptoms . Your desire to avoid long-term medication use . Time: allergy immunotherapy requires a  major time commitment . Cost: may vary depending on your insurance coverage  Allergy shots for children age 48 and older are effective and often well tolerated. They might prevent the onset of new allergen sensitivities or the progression to asthma.  Allergy shots are not started on patients who are pregnant but can be continued on patients who become pregnant while receiving them. In some patients with other medical conditions or who take certain common medications, allergy shots may be of risk. It is important to mention other medications you talk to your allergist.   When Will I Feel Better?  Some may experience decreased allergy symptoms during the build-up phase. For others, it may take as long as 12 months on the maintenance dose. If there is no improvement after a year of maintenance, your allergist will discuss other treatment options with you.  If you aren't responding to allergy shots, it may be because there is not enough dose of the allergen in your vaccine or there are missing allergens that were not identified during your allergy testing. Other reasons could be that there are high levels of the allergen in your environment or major exposure to non-allergic triggers like tobacco smoke.  What Is the Length of Treatment?  Once the maintenance dose is reached, allergy shots are generally continued for three to five years. The decision to stop should be discussed with your allergist at that time. Some people may experience a permanent reduction of allergy symptoms. Others may relapse and a longer course of allergy shots can be considered.  What Are the Possible Reactions?  The two types of adverse reactions that can occur with allergy shots are local and systemic. Common local reactions include very mild redness and swelling at the injection site, which can happen immediately or several hours after. A systemic reaction, which is less common, affects the entire body or a particular body  system. They are usually mild and typically respond quickly to medications. Signs include increased allergy symptoms such as sneezing, a stuffy nose or hives.  Rarely, a serious systemic reaction called anaphylaxis can develop. Symptoms include swelling in the throat, wheezing, a feeling of tightness in the chest, nausea or dizziness. Most serious systemic reactions develop within 30 minutes of allergy shots. This is why it is strongly recommended you wait in your doctor's office for 30 minutes after your injections. Your allergist is trained to watch for reactions, and his or her staff is trained and equipped with the proper medications to identify and treat them.  Who Should Administer Allergy Shots?  The preferred location for receiving shots is your prescribing allergist's office. Injections can sometimes be given at another facility where the physician and staff are trained to recognize and treat reactions, and have received instructions by your prescribing allergist.

## 2018-09-15 ENCOUNTER — Encounter: Payer: Self-pay | Admitting: Allergy & Immunology

## 2018-09-29 ENCOUNTER — Ambulatory Visit (INDEPENDENT_AMBULATORY_CARE_PROVIDER_SITE_OTHER): Payer: BLUE CROSS/BLUE SHIELD

## 2018-09-29 DIAGNOSIS — Z23 Encounter for immunization: Secondary | ICD-10-CM

## 2018-10-27 ENCOUNTER — Encounter: Payer: Self-pay | Admitting: Allergy & Immunology

## 2018-10-27 ENCOUNTER — Ambulatory Visit (INDEPENDENT_AMBULATORY_CARE_PROVIDER_SITE_OTHER): Payer: BLUE CROSS/BLUE SHIELD | Admitting: Allergy & Immunology

## 2018-10-27 VITALS — BP 124/84 | HR 78 | Resp 20

## 2018-10-27 DIAGNOSIS — J3089 Other allergic rhinitis: Secondary | ICD-10-CM | POA: Diagnosis not present

## 2018-10-27 DIAGNOSIS — J302 Other seasonal allergic rhinitis: Secondary | ICD-10-CM | POA: Diagnosis not present

## 2018-10-27 DIAGNOSIS — J4520 Mild intermittent asthma, uncomplicated: Secondary | ICD-10-CM

## 2018-10-27 MED ORDER — MONTELUKAST SODIUM 10 MG PO TABS: 10.0000 mg | ORAL_TABLET | Freq: Every day | ORAL | 5 refills | Status: DC

## 2018-10-27 NOTE — Patient Instructions (Addendum)
1. Mild intermittent asthma without complication - Lung testing looked good today. - We will not put on any daily medications for asthma. - Continue with albuterol 2-4 puffs every 4-6 hours as needed for coughing/wheezing/shortness of breath.   2. Chronic rhinitis (mouse, horse, trees, weeds, grasses, indoor molds, outdoor molds, cat and dog) - Continue taking: Xyzal (levocetirizine) 5mg  tablet once daily, Flonase (fluticasone) one spray per nostril daily and Astelin (azelastine) 2 sprays per nostril 1-2 times daily as needed  - Add on: Singulair (montelukast) 10mg  daily (this is generic and should be very cheap).  - You can use an extra dose of the antihistamine, if needed, for breakthrough symptoms.  - Consider nasal saline rinses 1-2 times daily to remove allergens from the nasal cavities as well as help with mucous clearance (this is especially helpful to do before the nasal sprays are given) - Consider allergy shots as a means of long-term control. - Information on allergy shots provided.   3. Return in about 6 months (around 04/27/2019).   Please inform us of any Emergency Department visits, hospitalizations, or changes in symptoms. Call us before going to the ED for breathing or allergy symptoms since we might be able to fit you in for a sick visit. Feel free to contact us anytime with any questions, problems, or concerns.  It was a pleasure to see you again today! Your boys are so adorbs!   Websites that have reliable patient information: 1. American Academy of Asthma, Allergy, and Immunology: www.aaaai.org 2. Food Allergy Research and Education (FARE): foodallergy.org 3. Mothers of Asthmatics: http://www.asthmacommunitynetwork.org 4. American College of Allergy, Asthma, and Immunology: MissingWeapons.cawww.acaai.org   Make sure you are registered to vote! If you have moved or changed any of your contact information, you will need to get this updated before voting!

## 2018-10-27 NOTE — Progress Notes (Signed)
FOLLOW UP  Date of Service/Encounter:  10/27/18   Assessment:   Mild intermittent asthma without complication  Seasonal and perennial allergic rhinitis (mouse, horse, trees, weeds, grasses, indoor molds, outdoor molds, cat and dog)  Adverse food reaction (cow'Marshall milk) - with negative testing, but seems to be dose dependent   Barbara Marshall seems to be doing well today.  However, this is only as long as she is interested in weaning off of some of these medications and wants to discuss allergy shots today.  We do spend some time discussing the mechanism of action as well as the risk and benefits.  She will bring the CPT codes to her insurance company to check on any copayments and make sure that treatments will be covered.  She will call us back when she reaches a decision about this.  Plan/Recommendations:   1. Mild intermittent asthma without complication - Lung testing looked good today. - We will not put on any daily medications for asthma. - Continue with albuterol 2-4 puffs every 4-6 hours as needed for coughing/wheezing/shortness of breath.   2. Chronic rhinitis (mouse, horse, trees, weeds, grasses, indoor molds, outdoor molds, cat and dog) - Continue taking: Xyzal (levocetirizine) 5mg  tablet once daily, Flonase (fluticasone) one spray per nostril daily and Astelin (azelastine) 2 sprays per nostril 1-2 times daily as needed  - Add on: Singulair (montelukast) 10mg  daily (this is generic and should be very cheap).  - You can use an extra dose of the antihistamine, if needed, for breakthrough symptoms.  - Consider nasal saline rinses 1-2 times daily to remove allergens from the nasal cavities as well as help with mucous clearance (this is especially helpful to do before the nasal sprays are given) - Consider allergy shots as a means of long-term control. - Information on allergy shots provided.   3. Return in about 6 months (around 04/27/2019).   Subjective:   Barbara Marshall is a  32 y.o. female presenting today for follow up of  Chief Complaint  Patient presents with  . Asthma    Barbara Marshall has a history of the following: Patient Active Problem List   Diagnosis Date Noted  . Seasonal and perennial allergic rhinitis 09/14/2018  . Mild intermittent asthma without complication 09/14/2018  . Not immune to rubella 07/26/2018  . Preterm premature rupture of membranes (PPROM) with onset of labor after 24 hours of rupture in third trimester, antepartum 07/26/2018  . Twin pregnancy, dichorionic/diamniotic, third trimester 07/26/2018  . Pregnancy 07/26/2018  . Severe preeclampsia, third trimester 07/22/2018  . Hypertension affecting pregnancy 07/21/2018  . Human immunodeficiency virus infection (HCC) 01/09/2018  . Anxiety 11/29/2016  . Human immunodeficiency virus (HIV) disease (HCC) 11/29/2016    History obtained from: chart review and patient.  [facility] Primary Care Provider is Barbara Marshall, Barbara BlanksWilliam S, MD.     Barbara Marshall is a 32 y.o. female presenting for a follow up visit. She was last seen in October 2019 as a new patient. At that time, her lung testing looked good. We continued her on albuterol 2-4 puffs as needed. Testing was positive to mouse, horse, trees, weeds, grasses, indoor molds, outdoor molds, cat and dog. Started started her on Xyzal as well as Flonase and Astelin.   Since the last visit, she has mostly done well. She recently went back to work after being on maternity leave. Her twin boys are currently being cared for her her mother, mother-in-law, and sister. She has noticed that she has  increased symptoms at work (she works as a Haematologist). Customers also smoke which seems to make her symptoms a lot worse.   Asthma/Respiratory Symptom History: She remains on the Proventil as needed. She is using it rarely, but her current one is almost empty. This was the one prescribed by her NP. She has been having some increased symptoms with the cold  weather. Cedra'Marshall asthma has been well controlled. She has not required rescue medication, experienced nocturnal awakenings due to lower respiratory symptoms, nor have activities of daily living been limited. She has required no Emergency Department or Urgent Care visits for her asthma. She has required zero courses of systemic steroids for asthma exacerbations since the last visit. ACT score today is 18, indicating subpar asthma symptom control.   Allergic Rhinitis Symptom History: She did not start the Xyzal since is was $75 out of pocket costs. She is on the nose sprays, but she reports burning with the azelastine nasal spray. She does not like the taste. She is using the Xyzal samples that we gave her, which she seems to like. She did not try any other antihistamines.   Otherwise, there have been no changes to her past medical history, surgical history, family history, or social history.  She does report not getting much sleep since she has 2 twin boys.    Review of Systems: a 14-point review of systems is pertinent for what is mentioned in HPI.  Otherwise, all other systems were negative.  Constitutional: negative other than that listed in the HPI Eyes: negative other than that listed in the HPI Ears, nose, mouth, throat, and face: negative other than that listed in the HPI Respiratory: negative other than that listed in the HPI Cardiovascular: negative other than that listed in the HPI Gastrointestinal: negative other than that listed in the HPI Genitourinary: negative other than that listed in the HPI Integument: negative other than that listed in the HPI Hematologic: negative other than that listed in the HPI Musculoskeletal: negative other than that listed in the HPI Neurological: negative other than that listed in the HPI Allergy/Immunologic: negative other than that listed in the HPI    Objective:   Blood pressure 124/84, pulse 78, resp. rate 20, unknown if currently  breastfeeding. There is no height or weight on file to calculate BMI.   Physical Exam:  General: Alert, interactive, in no acute distress. Pleasant female.  Eyes: No conjunctival injection bilaterally, no discharge on the right, no discharge on the left and no Horner-Trantas dots present. PERRL bilaterally. EOMI without pain. No photophobia.  Ears: Right TM pearly gray with normal light reflex, Left TM pearly gray with normal light reflex, Right TM intact without perforation and Left TM intact without perforation.  Nose/Throat: External nose within normal limits and septum midline. Turbinates edematous and pale with clear discharge. Posterior oropharynx erythematous without cobblestoning in the posterior oropharynx. Tonsils 2+ without exudates.  Tongue without thrush. Lungs: Clear to auscultation without wheezing, rhonchi or rales. No increased work of breathing. CV: Normal S1/S2. No murmurs. Capillary refill <2 seconds.  Skin: Warm and dry, without lesions or rashes. Neuro:   Grossly intact. No focal deficits appreciated. Responsive to questions.  Diagnostic studies:   Spirometry: results normal (FEV1: 3.17/104%, FVC: 3.82/99%, FEV1/FVC: 83%).    Spirometry consistent with normal pattern.  Allergy Studies: none       Malachi Bonds, MD  [facility]

## 2018-10-28 ENCOUNTER — Encounter: Payer: Self-pay | Admitting: Allergy & Immunology

## 2018-10-29 ENCOUNTER — Other Ambulatory Visit: Payer: Self-pay | Admitting: *Deleted

## 2018-10-29 MED ORDER — ALBUTEROL SULFATE HFA 108 (90 BASE) MCG/ACT IN AERS: 2.0000 | INHALATION_SPRAY | Freq: Four times a day (QID) | RESPIRATORY_TRACT | 1 refills | Status: DC | PRN

## 2018-12-04 ENCOUNTER — Other Ambulatory Visit: Payer: Self-pay | Admitting: Family Medicine

## 2018-12-06 NOTE — Telephone Encounter (Signed)
Need to call pt, see why wanting to resume, if iverall stable 30 d is ok with need for an o v before further, if not stable with mood of coursdocument and send my way

## 2018-12-06 NOTE — Telephone Encounter (Signed)
Patient states she did not request this medication.

## 2018-12-13 DIAGNOSIS — Z09 Encounter for follow-up examination after completed treatment for conditions other than malignant neoplasm: Secondary | ICD-10-CM | POA: Diagnosis not present

## 2018-12-13 DIAGNOSIS — B20 Human immunodeficiency virus [HIV] disease: Secondary | ICD-10-CM | POA: Diagnosis not present

## 2018-12-13 DIAGNOSIS — R030 Elevated blood-pressure reading, without diagnosis of hypertension: Secondary | ICD-10-CM | POA: Diagnosis not present

## 2018-12-14 DIAGNOSIS — A6000 Herpesviral infection of urogenital system, unspecified: Secondary | ICD-10-CM | POA: Insufficient documentation

## 2018-12-14 DIAGNOSIS — IMO0002 Reserved for concepts with insufficient information to code with codable children: Secondary | ICD-10-CM | POA: Insufficient documentation

## 2018-12-27 ENCOUNTER — Other Ambulatory Visit: Payer: Self-pay | Admitting: Allergy & Immunology

## 2019-02-24 ENCOUNTER — Other Ambulatory Visit: Payer: Self-pay | Admitting: Allergy & Immunology

## 2019-03-22 ENCOUNTER — Other Ambulatory Visit: Payer: Self-pay | Admitting: Allergy & Immunology

## 2019-04-05 ENCOUNTER — Other Ambulatory Visit: Payer: Self-pay | Admitting: Allergy & Immunology

## 2019-06-13 DIAGNOSIS — I10 Essential (primary) hypertension: Secondary | ICD-10-CM | POA: Diagnosis not present

## 2019-06-13 DIAGNOSIS — B20 Human immunodeficiency virus [HIV] disease: Secondary | ICD-10-CM | POA: Diagnosis not present

## 2019-06-13 DIAGNOSIS — O10919 Unspecified pre-existing hypertension complicating pregnancy, unspecified trimester: Secondary | ICD-10-CM | POA: Insufficient documentation

## 2019-07-21 DIAGNOSIS — B20 Human immunodeficiency virus [HIV] disease: Secondary | ICD-10-CM | POA: Diagnosis not present

## 2019-08-23 DIAGNOSIS — Z01419 Encounter for gynecological examination (general) (routine) without abnormal findings: Secondary | ICD-10-CM | POA: Diagnosis not present

## 2019-08-23 DIAGNOSIS — Z124 Encounter for screening for malignant neoplasm of cervix: Secondary | ICD-10-CM | POA: Diagnosis not present

## 2019-08-23 DIAGNOSIS — R102 Pelvic and perineal pain: Secondary | ICD-10-CM | POA: Diagnosis not present

## 2019-08-29 ENCOUNTER — Other Ambulatory Visit: Payer: Self-pay | Admitting: Allergy & Immunology

## 2019-08-31 ENCOUNTER — Other Ambulatory Visit: Payer: Self-pay | Admitting: Allergy & Immunology

## 2019-09-06 DIAGNOSIS — R102 Pelvic and perineal pain: Secondary | ICD-10-CM | POA: Diagnosis not present

## 2019-10-21 ENCOUNTER — Other Ambulatory Visit: Payer: Self-pay

## 2019-10-21 ENCOUNTER — Encounter: Payer: Self-pay | Admitting: Nurse Practitioner

## 2019-10-21 ENCOUNTER — Ambulatory Visit (INDEPENDENT_AMBULATORY_CARE_PROVIDER_SITE_OTHER): Payer: BC Managed Care – PPO | Admitting: Nurse Practitioner

## 2019-10-21 VITALS — BP 130/82 | Temp 97.1°F | Wt 195.6 lb

## 2019-10-21 DIAGNOSIS — M778 Other enthesopathies, not elsewhere classified: Secondary | ICD-10-CM | POA: Diagnosis not present

## 2019-10-21 DIAGNOSIS — Z23 Encounter for immunization: Secondary | ICD-10-CM

## 2019-10-21 DIAGNOSIS — F419 Anxiety disorder, unspecified: Secondary | ICD-10-CM | POA: Diagnosis not present

## 2019-10-21 DIAGNOSIS — F329 Major depressive disorder, single episode, unspecified: Secondary | ICD-10-CM | POA: Diagnosis not present

## 2019-10-21 DIAGNOSIS — F32A Depression, unspecified: Secondary | ICD-10-CM

## 2019-10-21 HISTORY — DX: Anxiety disorder, unspecified: F41.9

## 2019-10-21 MED ORDER — ESCITALOPRAM OXALATE 10 MG PO TABS: 10.0000 mg | ORAL_TABLET | Freq: Every day | ORAL | 0 refills | Status: DC

## 2019-10-21 MED ORDER — NAPROXEN 500 MG PO TABS: 500.0000 mg | ORAL_TABLET | Freq: Two times a day (BID) | ORAL | 0 refills | Status: DC

## 2019-10-21 NOTE — Progress Notes (Signed)
   Subjective:    Patient ID: Barbara Marshall, female    DOB: May 26, 1986, 33 y.o.   MRN: [id number]  Wrist Pain  This is a new problem. Episode onset: last couple of months. Associated symptoms comments: Hard to push off bed, chair, or off the floor, has issues with picking up her kids.. Treatments tried: Tylenol, Motrin  The treatment provided no relief.   Pt would like to be placed back Citalopram. Pt stopped this med last January due to pregnancy.   Review of Systems     Objective:   Physical Exam        Assessment & Plan:

## 2019-10-21 NOTE — Patient Instructions (Addendum)
-Cold/Heat Therapy -Wrist Brace for support (wear as much as you can) -Can try Voltaren gel or Lidocaine patch Wrist and Forearm Exercises Ask your health care provider which exercises are safe for you. Do exercises exactly as told by your health care provider and adjust them as directed. It is normal to feel mild stretching, pulling, tightness, or discomfort as you do these exercises. Stop right away if you feel sudden pain or your pain gets worse. Do not begin these exercises until told by your health care provider. Range-of-motion exercises These exercises warm up your muscles and joints and improve the movement and flexibility of your injured wrist and forearm. These exercises also help to relieve pain, numbness, and tingling. These exercises are done using the muscles in your injured wrist and forearm. Wrist flexion 1. Bend your left / right elbow to a 90-degree angle (right angle) with your palm facing the floor. 2. Bend your wrist so that your fingers point toward the floor (flexion). 3. Hold this position for __________ seconds. 4. Slowly return to the starting position. Repeat __________ times. Complete this exercise __________ times a day. Wrist extension 1. Bend your left / right elbow to a 90-degree angle (right angle) with your palm facing the floor. 2. Bend your wrist so that your fingers point toward the ceiling (extension). 3. Hold this position for __________ seconds. 4. Slowly return to the starting position. Repeat __________ times. Complete this exercise __________ times a day. Ulnar deviation 1. Bend your left / right elbow to a 90-degree angle (right angle), and rest your forearm on a table with your palm facing down. 2. Keeping your hand flat on the table, bend your left /right wrist toward your small finger (pinkie). This is ulnar deviation. 3. Hold this position for __________ seconds. 4. Slowly return to the starting position. Repeat __________ times. Complete this  exercise __________ times a day. Radial deviation 1. Bend your left / right elbow to a 90-degree angle (right angle), and rest your forearm on a table with your palm facing down. 2. Keeping your hand flat on the table, bend your left /right wrist toward your thumb. This is radial deviation. 3. Hold this position for __________ seconds. 4. Slowly return to the starting position. Repeat __________ times. Complete this exercise __________ times a day. Forearm rotation, supination  1. Sit with your left / right elbow bent to a 90-degree angle (right angle). Position your forearm so that the thumb is facing the ceiling (neutral position). 2. Turn (rotate) your palm up toward the ceiling (supination), stopping when you feel a gentle stretch. 3. Hold this position for __________ seconds. 4. Slowly return to the starting position. Repeat __________ times. Complete this exercise __________ times a day. Forearm rotation, pronation  1. Sit with your left / right elbow bent to a 90-degree angle (right angle). Position your forearm so that the thumb is facing the ceiling (neutral position). 2. Rotate your palm down toward the floor (pronation), stopping when you feel a gentle stretch. 3. Hold this position for __________ seconds. 4. Slowly return to the starting position. Repeat __________ times. Complete this exercise __________ times a day. Stretching These exercises warm up your muscles and joints and improve the movement and flexibility of your injured wrist and forearm. These exercises also help to relieve pain, numbness, and tingling. These exercises are done using your healthy wrist and forearm to help stretch the muscles in your injured wrist and forearm. Wrist flexion  1. Extend your left /  right arm in front of you, and turn your palm down toward the floor. ? If told by your health care provider, bend your left / right elbow to a 90-degree angle (right angle) at your side. 2. Using your  uninjured hand, gently press over the back of your left / right hand to bend your wrist and fingers toward the floor (flexion). Go as far as you can to feel a stretch without causing pain. 3. Hold this position for __________ seconds. 4. Slowly return to the starting position. Repeat __________ times. Complete this exercise __________ times a day. Wrist extension  1. Extend your left / right arm in front of you and turn your palm up toward the ceiling. ? If told by your health care provider, bend your left / right elbow to a 90-degree angle (right angle) at your side. 2. Using your uninjured hand, gently press over the palm of your left / right hand to bend your wrist and fingers toward the floor (extension). Go as far as you can to feel a stretch without causing pain. 3. Hold this position for __________ seconds. 4. Slowly return to the starting position. Repeat __________ times. Complete this exercise __________ times a day. Forearm rotation, supination 1. Sit with your left / right elbow bent to a 90-degree angle (right angle). Position your forearm so that the thumb is facing the ceiling (neutral position). 2. Rotate your palm up toward the ceiling as far as you can on your own (supination). Then, use your uninjured hand to help turn your forearm more, stopping when you feel a gentle stretch. 3. Hold this position for __________ seconds. 4. Slowly return to the starting position. Repeat __________ times. Complete this exercise __________ times a day. Forearm rotation, pronation 1. Sit with your left / right elbow bent to a 90-degree angle (right angle). Position your forearm so that the thumb is facing the ceiling (neutral position). 2. Rotate your palm down toward the floor as far as you can on your own (pronation). Then, use your uninjured hand to help turn your forearm more, stopping when you feel a gentle stretch. 3. Hold this position for __________ seconds. 4. Slowly return to the  starting position. Repeat __________ times. Complete this exercise __________ times a day. Strengthening exercises These exercises build strength and endurance in your wrist and forearm. Endurance is the ability to use your muscles for a long time, even after they get tired. Wrist flexion  1. Sit with your left / right forearm supported on a table or other surface. Bend your elbow to a 90-degree angle (right angle), and rest your hand palm-up over the edge of the table. 2. Hold a __________ weight in your left / right hand. Or, hold an exercise band or tube in both hands, keeping your hands at the same level and hip distance apart. There should be a slight tension in the exercise band or tube. 3. Slowly curl your hand up toward the ceiling (flexion). 4. Hold this position for __________ seconds. 5. Slowly lower your hand back to the starting position. Repeat __________ times. Complete this exercise __________ times a day. Wrist extension  1. Sit with your left / right forearm supported on a table or other surface. Bend your elbow to a 90-degree angle (right angle), and rest your hand palm-down over the edge of the table. 2. Hold a __________ weight in your left / right hand. Or, hold an exercise band or tube in both hands, keeping your  hands at the same level and hip distance apart. There should be a slight tension in the exercise band or tube. 3. Slowly curl your hand up toward the ceiling (extension). 4. Hold this position for __________ seconds. 5. Slowly lower your hand back to the starting position. Repeat __________ times. Complete this exercise __________ times a day. Forearm rotation, supination  1. Sit with your left / right forearm supported on a table or other surface. Bend your elbow to a 90-degree angle (right angle). Position your forearm so that your thumb is facing the ceiling (neutral position) and your hand is resting over the edge of the table. 2. Hold a hammer in your left  / right hand. ? This exercise will be easier if you hold the hammer near the head of the hammer. ? This exercise will be harder if you hold the hammer near the end of the handle. 3. Without moving your elbow, slowly rotate your palm up toward the ceiling (supination). 4. Hold this position for __________ seconds. 5. Slowly return to the starting position. Repeat __________ times. Complete this exercise __________ times a day. Forearm rotation, pronation  1. Sit with your left / right forearm supported on a table or other surface. Bend your elbow to a 90-degree angle (right angle). Position your forearm so that the thumb is facing the ceiling (neutral position), with your hand resting over the edge of the table. 2. Hold a hammer in your left / right hand. ? This exercise will be easier if you hold the hammer near the head of the hammer. ? This exercise will be harder if you hold the hammer near the end of the handle. 3. Without moving your elbow, slowly rotate your palm down toward the floor (pronation). 4. Hold this position for __________ seconds. 5. Slowly return to the starting position. Repeat __________ times. Complete this exercise __________ times a day. Grip strengthening  1. Grasp a stress ball or other ball in the middle of your left / right hand. Start with your elbow bent to a 90-degree angle (right angle). 2. Slowly increase the pressure, squeezing the ball as hard as you can without causing pain. ? Think of bringing the tips of your fingers into the middle of your palm. All of your finger joints should bend when doing this exercise. ? To make this exercise harder, gradually try to straighten your elbow in front of you, until you can do the exercise with your elbow fully straight. 3. Hold your squeeze for __________ seconds, then relax. If instructed by your health care provider, do this exercise: ? With your forearm positioned so that the thumb is facing the ceiling (neutral  position). ? With your forearm turned palm down. ? With your forearm turned palm up. Repeat __________ times. Complete this exercise __________ times a day. This information is not intended to replace advice given to you by your health care provider. Make sure you discuss any questions you have with your health care provider. Document Released: 10/08/2005 Document Revised: 01/13/2019 Document Reviewed: 01/13/2019 Elsevier Patient Education  2020 [employer]

## 2019-10-21 NOTE — Progress Notes (Signed)
Subjective:    Patient ID: Barbara Marshall, female    DOB: 16-Jul-1986, 33 y.o.   MRN: [id number]  Wrist Pain  This is a new problem. Episode onset: last couple of months. Associated symptoms comments: Hard to push off bed, chair, or off the floor, has issues with picking up her kids.. Treatments tried: Tylenol, Motrin  The treatment provided no relief.  Pt denies any injury or trauma to her wrists. Pain in is both wrist. 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 Movement of the wrist makes the pain worse.  Depression/Anxiety: Pt would like to be placed back on antidepressant medication. Pt reports feeling emotional and irritable. Pt took citalopram in the past, however pt stopped this med Jan 2019 due to pregnancy. Pt stated she had a few pills left and restarted taking citalopram, however she started having difficulty sleeping and restless legs. Denies SI/HI.    Depression screen [facility] 2/9 10/21/2019 11/04/2017  Decreased Interest 1 1  Down, Depressed, Hopeless 2 1  PHQ - 2 Score 3 2  Altered sleeping 2 2  Tired, decreased energy 2 2  Change in appetite 1 0  Feeling bad or failure about yourself  3 1  Trouble concentrating 1 1  Moving slowly or fidgety/restless 1 1  Suicidal thoughts 0 0  PHQ-9 Score 13 9  Difficult doing work/chores Somewhat difficult Somewhat difficult   GAD 7 : Generalized Anxiety Score 10/21/2019 11/28/2016  Nervous, Anxious, on Edge 2 3  Control/stop worrying 2 3  Worry too much - different things 3 3  Trouble relaxing 2 2  Restless 1 3  Easily annoyed or irritable 3 2  Afraid - awful might happen 1 1  Total GAD 7 Score 14 17  Anxiety Difficulty Somewhat difficult Not difficult at all    Review of Systems General: Denies fever, fatigue, or chills. Musculoskeletal: Denies shoulder and elbow pain. Denies numbness, tingling, or weakness of shoulder, elbow, or wrist. Positive for tenderness and pain of the both wrists. Psychiatric: Denies  self-injury or suicidal ideations.     Objective:   Physical Exam General: Alert and oriented. Well-appearing woman. Cheerful calm affect. Making good eye contact. Thoughts logical, coherent and relevant.  Musculoskeletal:  Shoulder: No tenderness, swelling, or crepitus. Full ROM bilaterally.  Elbow: No tenderness, swelling, or crepitus. Full ROM bilaterally.  Biceps reflex +2.  Right wrist: No deformities or cysts present. Full ROM. Strength  5/5. Brachioradialis reflex +2. Negative Phalen's and Tinel's sign.  Radial pulse +2 Mild tenderness with flexion and extension of the  wrist  Mild tenderness on the dorsal aspect with palpation.  Left  wrist: No deformities or cysts present. Full ROM. Strength  5/5. Brachioradialis reflex +2. Negative Phalen's and Tinel's sign.  Radial pulse +2. Mild tenderness with flexion and extension of the  wrist  Mild tenderness on the dorsal aspect with palpation. Psychiatric: Normal mood, affect, and speech.      Assessment & Plan:   Problem List Items Addressed This Visit      Other   Anxiety   Relevant Medications   escitalopram (LEXAPRO) 10 MG tablet   Anxiety and depression   Relevant Medications   escitalopram (LEXAPRO) 10 MG tablet    Other Visit Diagnoses    Tendonitis of both wrists    -  Primary   Need for vaccination       Relevant Orders   Flu Vaccine QUAD 6+ mos PF IM (Fluarix  Quad PF) (Completed)       Meds ordered this encounter  Medications  . naproxen (NAPROSYN) 500 MG tablet    Sig: Take 1 tablet (500 mg total) by mouth 2 (two) times daily with a meal. PRN for pain    Dispense:  30 tablet    Refill:  0    Order Specific Question:   Supervising Provider    Answer:   [place], [place]  . escitalopram (LEXAPRO) 10 MG tablet    Sig: Take 1 tablet (10 mg total) by mouth daily.    Dispense:  30 tablet    Refill:  0    Order Specific Question:   Supervising Provider    Answer:   Sallee Lange A [[id number]]    -Recommend a  wrist brace. Education provided about wrist exercises, voltaren gel, lidocaine patch, or cold/heat therapy to help. Prescription NSAID ordered. Educated about taking with food and advised common symptoms of NSAIDs. If wrist pain unrelieved or worsening in 7-10 days, please contact provider. Will switch from citalopram to escitalopram to see if it will help better control anxiety/depression. Reviewed common side effects such as GI symptoms (nausea, vomiting, and diarrhea). Instructed if worsening symptoms or suicidal ideation, immediately stop medication and contact provider.  Return in about 1 month (around 11/20/2019) for follow-up for depression and anxiety.

## 2019-11-15 ENCOUNTER — Other Ambulatory Visit: Payer: Self-pay | Admitting: Nurse Practitioner

## 2019-11-18 ENCOUNTER — Encounter: Payer: Self-pay | Admitting: Nurse Practitioner

## 2019-11-18 ENCOUNTER — Ambulatory Visit (INDEPENDENT_AMBULATORY_CARE_PROVIDER_SITE_OTHER): Payer: BC Managed Care – PPO | Admitting: Nurse Practitioner

## 2019-11-18 ENCOUNTER — Other Ambulatory Visit: Payer: Self-pay

## 2019-11-18 VITALS — BP 118/76 | Temp 98.5°F | Ht 64.0 in | Wt 196.0 lb

## 2019-11-18 DIAGNOSIS — F32A Depression, unspecified: Secondary | ICD-10-CM

## 2019-11-18 DIAGNOSIS — F329 Major depressive disorder, single episode, unspecified: Secondary | ICD-10-CM | POA: Diagnosis not present

## 2019-11-18 DIAGNOSIS — F419 Anxiety disorder, unspecified: Secondary | ICD-10-CM | POA: Diagnosis not present

## 2019-11-18 MED ORDER — LORAZEPAM 0.5 MG PO TABS: 0.5000 mg | ORAL_TABLET | Freq: Every day | ORAL | 0 refills | Status: DC

## 2019-11-18 MED ORDER — SERTRALINE HCL 50 MG PO TABS: ORAL_TABLET | ORAL | 0 refills | Status: DC

## 2019-11-18 NOTE — Progress Notes (Signed)
Subjective:    Patient ID: Barbara Marshall, female    DOB: 1986/11/01, 33 y.o.   MRN: [id number]  HPIFollow up on anxiety. pHQ9 and GAD 7 done.  Presents for complaints of an exacerbation of her anxiety and depression symptoms.  Minimal relief with Escitalopram 10 mg daily.  Her husband works as a Technical brewer, was recently involved in a shooting where he was shot in the leg.  States he is doing fine but is added to her stress.  Has early morning awakenings, sometimes up 3-4 times per night.  Picky eater but has gained some weight. GAD 7 : Generalized Anxiety Score 11/18/2019 10/21/2019 11/28/2016  Nervous, Anxious, on Edge 2 2 3   Control/stop worrying 2 2 3   Worry too much - different things 3 3 3   Trouble relaxing 2 2 2   Restless 2 1 3   Easily annoyed or irritable 3 3 2   Afraid - awful might happen 2 1 1   Total GAD 7 Score 16 14 17   Anxiety Difficulty - Somewhat difficult Not difficult at all    Depression screen [facility] 2/9 11/18/2019 10/21/2019 11/04/2017  Decreased Interest 1 1 1   Down, Depressed, Hopeless 1 2 1   PHQ - 2 Score 2 3 2   Altered sleeping 2 2 2   Tired, decreased energy 1 2 2   Change in appetite 1 1 0  Feeling bad or failure about yourself  2 3 1   Trouble concentrating 2 1 1   Moving slowly or fidgety/restless 1 1 1   Suicidal thoughts 0 0 0  PHQ-9 Score 11 13 9   Difficult doing work/chores Somewhat difficult Somewhat difficult Somewhat difficult   Denies any self-harm.  Denies suicidal homicidal thoughts or ideation.  Gets regular preventive health physicals and lab work.   Review of Systems     Objective:   Physical Exam NAD.  Alert, oriented.  Making good eye contact.  Mildly anxious affect.  Lungs clear.  Heart regular rate rhythm.  Dressed appropriately.  Thoughts logical coherent and relevant.       Assessment & Plan:   Problem List Items Addressed This Visit      Other   Anxiety and depression - Primary   Relevant Medications   sertraline (ZOLOFT) 50  MG tablet   LORazepam (ATIVAN) 0.5 MG tablet     Meds ordered this encounter  Medications  . sertraline (ZOLOFT) 50 MG tablet    Sig: Take 1/2 tab po qam for 6 days then one po qam    Dispense:  30 tablet    Refill:  0    Order Specific Question:   Supervising Provider    Answer:   Sallee Lange A [[id number]]  . LORazepam (ATIVAN) 0.5 MG tablet    Sig: Take 1 tablet (0.5 mg total) by mouth at bedtime. Prn sleep    Dispense:  30 tablet    Refill:  0    Order Specific Question:   Supervising Provider    Answer:   Sallee Lange A [[id number]]   DC Escitalopram.  Plan slow titration of Zoloft if tolerated.  Reviewed potential adverse effects.  DC med and contact office if any problems. Discussed counseling but patient wants to hold on this for now. Use lorazepam as directed at bedtime if needed.  Discussed potential drowsiness, patient states her husband can help her listen out for her children. Return in about 1 month (around 12/19/2019). Patient to contact office sooner if she wishes to increase dose of sertraline before her  next visit.

## 2019-12-10 ENCOUNTER — Other Ambulatory Visit: Payer: Self-pay | Admitting: Nurse Practitioner

## 2019-12-23 ENCOUNTER — Other Ambulatory Visit: Payer: Self-pay

## 2019-12-23 ENCOUNTER — Ambulatory Visit (INDEPENDENT_AMBULATORY_CARE_PROVIDER_SITE_OTHER): Payer: BC Managed Care – PPO | Admitting: Nurse Practitioner

## 2019-12-23 DIAGNOSIS — F419 Anxiety disorder, unspecified: Secondary | ICD-10-CM | POA: Diagnosis not present

## 2019-12-23 DIAGNOSIS — F329 Major depressive disorder, single episode, unspecified: Secondary | ICD-10-CM | POA: Diagnosis not present

## 2019-12-23 DIAGNOSIS — F32A Depression, unspecified: Secondary | ICD-10-CM

## 2019-12-23 MED ORDER — LORAZEPAM 0.5 MG PO TABS: 0.5000 mg | ORAL_TABLET | Freq: Every day | ORAL | 2 refills | Status: DC

## 2019-12-23 NOTE — Progress Notes (Signed)
  VIRTUAL VISIT Subjective:    Patient ID: Barbara Marshall, female    DOB: Jan 14, 1986, 34 y.o.   MRN: [id number]  HPI Patient presents for virtual visit today for follow up after starting zoloft for anxiety and depression as well as ativan for sleep. Escitalopram '10mg'$  was stopped due to having minimal relief of symptoms while also experience insomnia and restless legs. Patient having increase in stress due to husband being a Technical brewer and recently involved in shooting incident as well as having one of her twins taken to [facility]. Despite this increase in stress, patient reports zoloft '50mg'$  to have improved symptoms but not experiencing adequate relief. She denies suicidal thoughts, self harm, or other adverse effects. Patient reports sleeping cycle to have improved with the ativan.     GAD 7 : Generalized Anxiety Score 12/23/2019 11/18/2019 10/21/2019 11/28/2016  Nervous, Anxious, on Edge '1 2 2 3  '$ Control/stop worrying '3 2 2 3  '$ Worry too much - different things '3 3 3 3  '$ Trouble relaxing '2 2 2 2  '$ Restless '2 2 1 3  '$ Easily annoyed or irritable '3 3 3 2  '$ Afraid - awful might happen '1 2 1 1  '$ Total GAD 7 Score '15 16 14 17  '$ Anxiety Difficulty Somewhat difficult - Somewhat difficult Not difficult at all    Review of Systems  See HPI     Objective:   Physical Exam  Patient at home without environmental distractions. Patient awake, thoughts cohesive, and maintained eye contact appropriately. Patient did not appear to be in distress or disheveled.      Assessment & Plan:   Anxiety and depression Medication: Increase zoloft from '50mg'$  to '75mg'$  for two weeks then increase to '100mg'$  if therapeutic goals not met.  Education: Patient educated on potential adverse effects of medication. Patient also educated on warning signs and symptoms of worsening anxiety and depression.  Follow Up: return for virtual visit in 4-6 weeks. Call back sooner if needed.

## 2019-12-23 NOTE — Progress Notes (Signed)
   Subjective:    Patient ID: Barbara Marshall, female    DOB: Mar 30, 1986, 34 y.o.   MRN: [id number]  HPI  Patient calls for a follow up on depression and anxiety. Patient states she is doing better on the medication but not at perfect place- still feels quick to anger but is much better than she was and feels like she is going in the right direction.  Virtual Visit via Video Note  I connected with [name] on 12/23/19 at  9:20 AM EST by a video enabled telemedicine application and verified that I am speaking with the correct person using two identifiers.  Location: Patient: home Provider: office   I discussed the limitations of evaluation and management by telemedicine and the availability of in person appointments. The patient expressed understanding and agreed to proceed.  History of Present Illness:    Observations/Objective:   Assessment and Plan:   Follow Up Instructions:    I discussed the assessment and treatment plan with the patient. The patient was provided an opportunity to ask questions and all were answered. The patient agreed with the plan and demonstrated an understanding of the instructions.   The patient was advised to call back or seek an in-person evaluation if the symptoms worsen or if the condition fails to improve as anticipated.  I provided 15 minutes of non-face-to-face time during this encounter.     Review of Systems     Objective:   Physical Exam        Assessment & Plan:

## 2019-12-25 ENCOUNTER — Encounter: Payer: Self-pay | Admitting: Nurse Practitioner

## 2020-01-09 ENCOUNTER — Encounter: Payer: Self-pay | Admitting: Family Medicine

## 2020-01-30 DIAGNOSIS — Z79899 Other long term (current) drug therapy: Secondary | ICD-10-CM | POA: Diagnosis not present

## 2020-01-30 DIAGNOSIS — F419 Anxiety disorder, unspecified: Secondary | ICD-10-CM | POA: Diagnosis not present

## 2020-01-30 DIAGNOSIS — I10 Essential (primary) hypertension: Secondary | ICD-10-CM | POA: Diagnosis not present

## 2020-01-30 DIAGNOSIS — B20 Human immunodeficiency virus [HIV] disease: Secondary | ICD-10-CM | POA: Diagnosis not present

## 2020-02-10 IMAGING — US US MFM OB DETAIL EACH ADDL GEST+14 WK
1 series · 12 of 28 positions shown · non-contrast
Comparison: none

[Series 1: us mfm ob detail each addl gest+14 wk · 134 acquisitions, 12 frames shown]
[im 5/134]
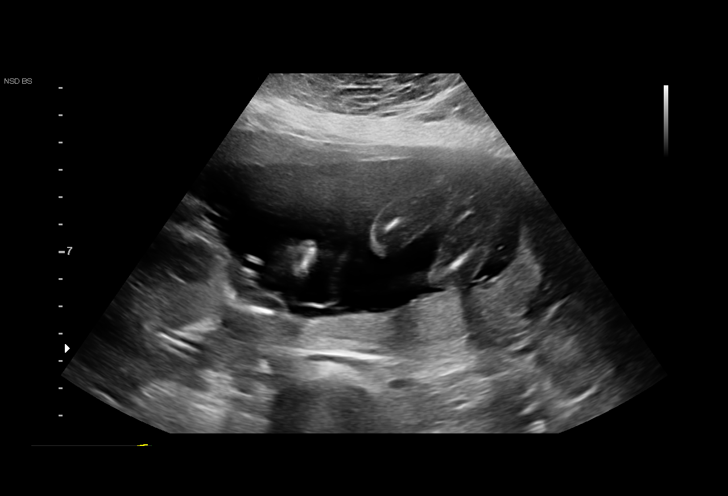
[im 15/134]
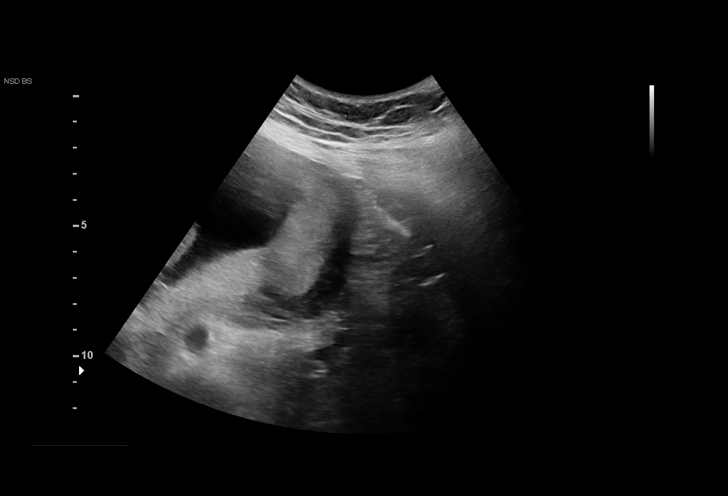
[im 25/134]
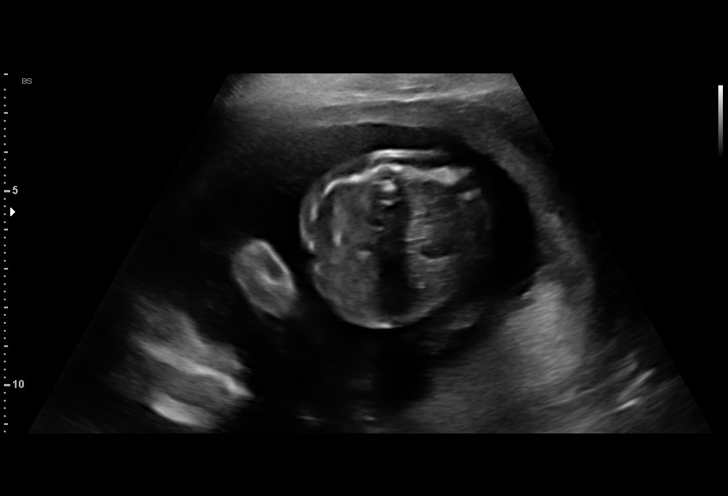
[im 40/134]
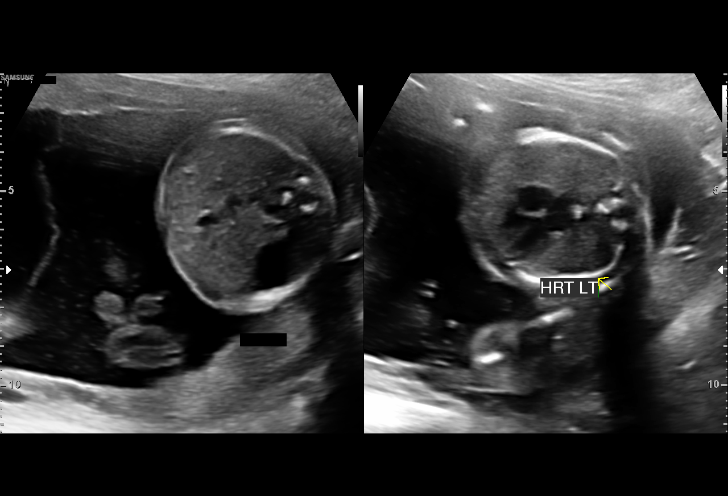
[im 50/134]
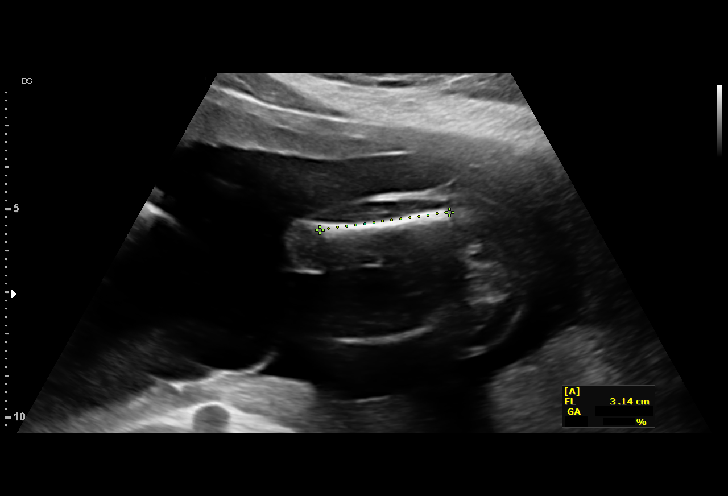
[im 60/134]
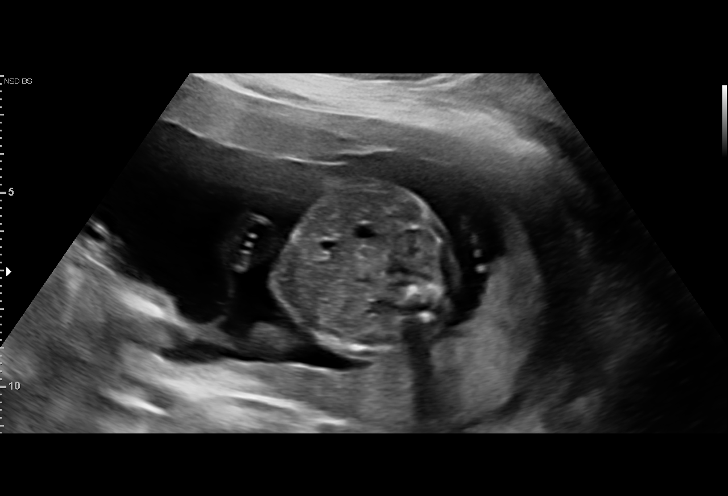
[im 74/134]
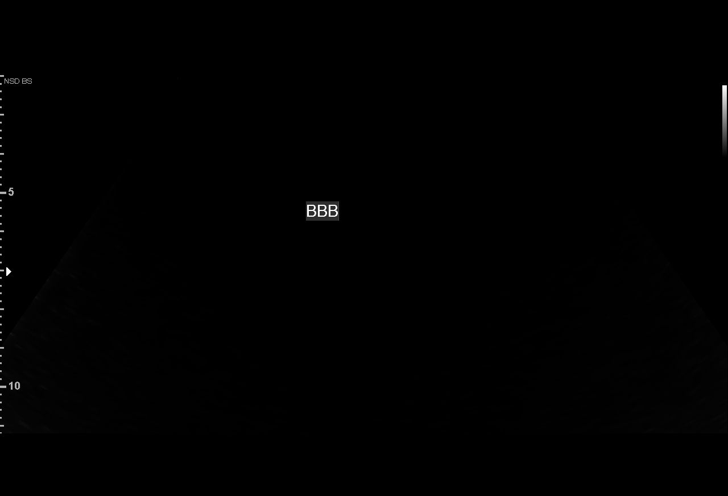
[im 84/134]
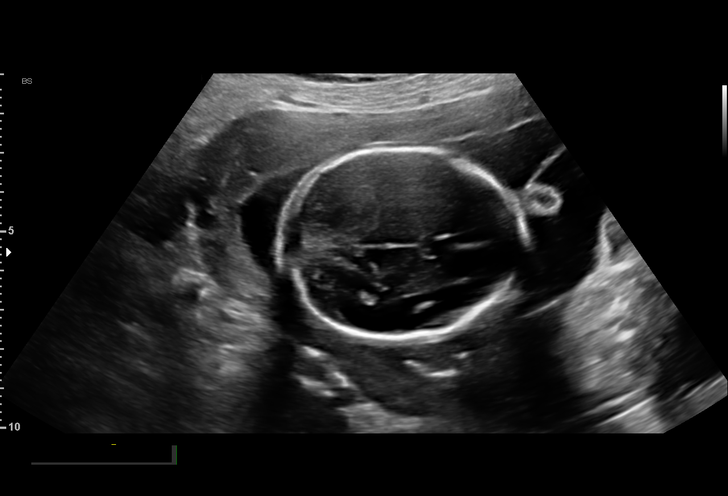
[im 94/134]
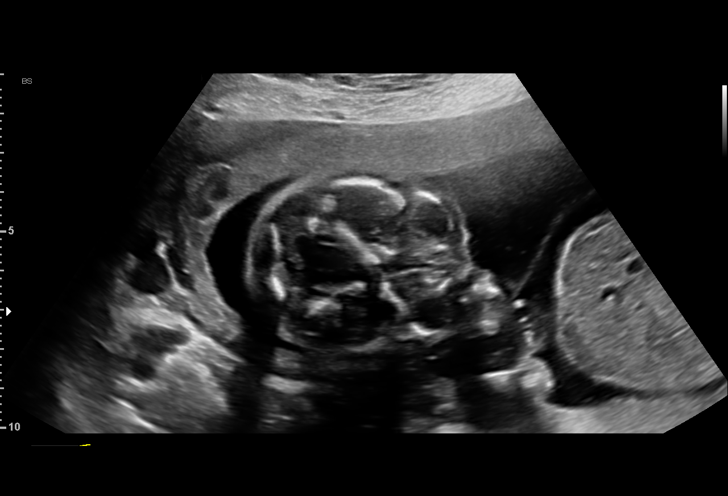
[im 109/134]
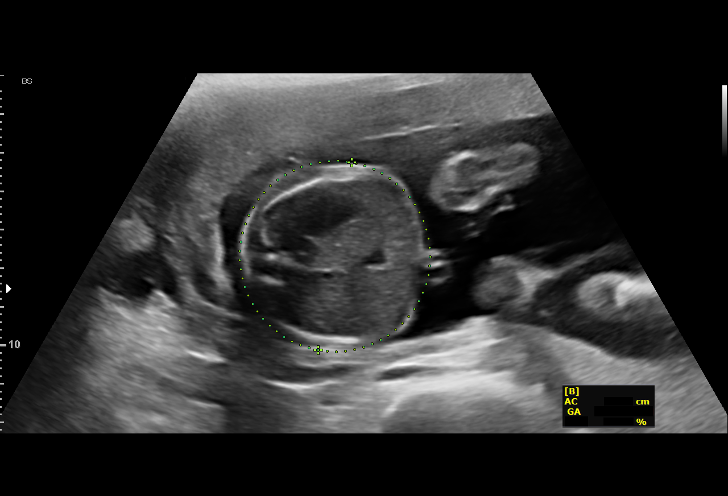
[im 119/134]
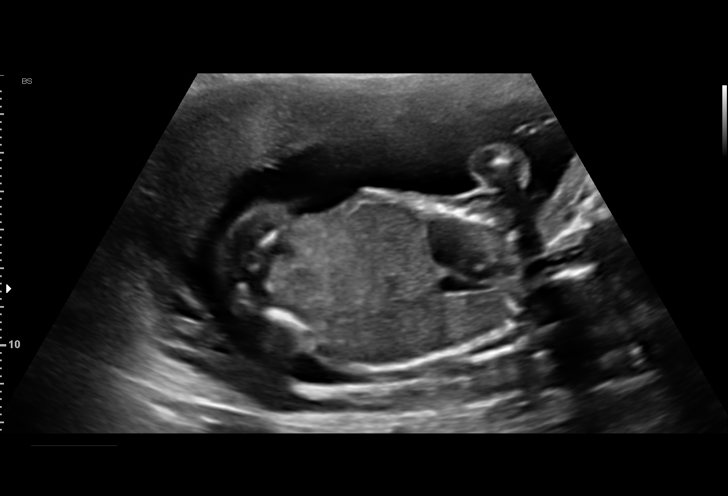
[im 129/134]
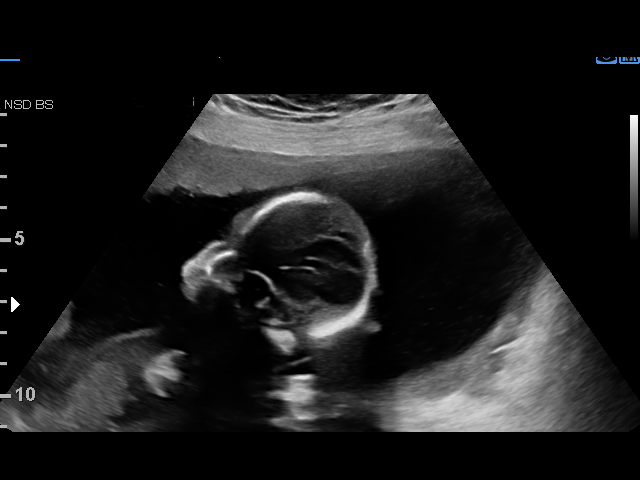

[12 of 28 positions shown; findings below may reference images not displayed]

#130

WK

1  SAGON MANKAI               [id number]      [id number]     [id number]
2  SAGON MANKAI               [id number]      [id number]     [id number]
Indications

20 weeks gestation of pregnancy
Twin pregnancy, di/di, second trimester
HIV affecting pregnancy, second trimester
OB History

Gravidity:    1         Term:   0        Prem:   0        SAB:   0
TOP:          0       Ectopic:  0        Living: 0
Fetal Evaluation (Fetus A)

Num Of Fetuses:     2
Fetal Heart         150
Rate(bpm):
Cardiac Activity:   Observed
Fetal Lie:          Maternal left side
Presentation:       Cephalic
Placenta:           Posterior, above cervical os
P. Cord Insertion:  Visualized
Membrane Desc:      Dividing Membrane seen - Dichorionic.

Amniotic Fluid
AFI FV:      Subjectively within normal limits

Largest Pocket(cm)
6.0
Biometry (Fetus A)
BPD:      49.6  mm     G. Age:  21w 0d         78  %    CI:        77.93   %    70 - 86
FL/HC:      18.5   %    16.8 -
HC:      177.8  mm     G. Age:  20w 2d         41  %    HC/AC:      1.17        1.09 -
AC:      151.4  mm     G. Age:  20w 2d         47  %    FL/BPD:     66.3   %
FL:       32.9  mm     G. Age:  20w 2d         42  %    FL/AC:      21.7   %    20 - 24
HUM:      30.3  mm     G. Age:  20w 0d         44  %
CER:      20.8  mm     G. Age:  19w 6d         39  %
CM:        4.9  mm

Est. FW:     348  gm    0 lb 12 oz      50  %     FW Discordancy      0 \ 0 %
Gestational Age (Fetus A)

LMP:           20w 2d        Date:  12/07/17                 EDD:   09/13/18
U/S Today:     20w 3d                                        EDD:   09/12/18
Best:          20w 2d     Det. By:  LMP  (12/07/17)          EDD:   09/13/18
Anatomy (Fetus A)

Cranium:               Appears normal         LVOT:                   Appears normal
Cavum:                 Appears normal         Aortic Arch:            Appears normal
Ventricles:            Appears normal         Ductal Arch:            Not well visualized
Choroid Plexus:        Appears normal         Diaphragm:              Appears normal
Cerebellum:            Appears normal         Stomach:                Appears normal, left
sided
Posterior Fossa:       Appears normal         Abdomen:                Appears normal
Nuchal Fold:           Not applicable (>20    Abdominal Wall:         Appears nml (cord
wks GA)                                        insert, abd wall)
Face:                  Appears normal         Cord Vessels:           Appears normal (3
(orbits and profile)                           vessel cord)
Lips:                  Appears normal         Kidneys:                Appear normal
Palate:                Not well visualized    Bladder:                Appears normal
Thoracic:              Appears normal         Spine:                  Appears normal
Heart:                 Appears normal         Upper Extremities:      Visualized
(4CH, axis, and situs
RVOT:                  Appears normal         Lower Extremities:      Visualized

Other:  Fetus appears to be a male. Rt heel visualized. Technically difficult
due to fetal position.

Fetal Evaluation (Fetus B)

Num Of Fetuses:     2
Fetal Heart         136
Rate(bpm):
Cardiac Activity:   Observed
Fetal Lie:          Maternal right side
Presentation:       Cephalic
Placenta:           Anterior, above cervical os
P. Cord Insertion:  Visualized, central
Membrane Desc:      Dividing Membrane seen - Dichorionic.

Amniotic Fluid
AFI FV:      Subjectively within normal limits

Largest Pocket(cm)
6
Biometry (Fetus B)

BPD:      47.8  mm     G. Age:  20w 3d         57  %    CI:         70.3   %    70 - 86
FL/HC:      17.6   %    16.8 -
HC:      181.8  mm     G. Age:  20w 4d         55  %    HC/AC:      1.18        1.09 -
AC:      154.5  mm     G. Age:  20w 5d         55  %    FL/BPD:     66.9   %
FL:         32  mm     G. Age:  20w 0d         31  %    FL/AC:      20.7   %    20 - 24
HUM:      31.4  mm     G. Age:  20w 3d         56  %
CER:      21.7  mm     G. Age:  20w 5d         56  %

CM:        3.3  mm

Est. FW:     348  gm    0 lb 12 oz      50  %     FW Discordancy      0 \ 0 %
Gestational Age (Fetus B)

LMP:           20w 2d        Date:  12/07/17                 EDD:   09/13/18
U/S Today:     20w 3d                                        EDD:   09/12/18
Best:          20w 2d     Det. By:  LMP  (12/07/17)          EDD:   09/13/18
Anatomy (Fetus B)

Cranium:               Appears normal         LVOT:                   Not well visualized
Cavum:                 Appears normal         Aortic Arch:            Not well visualized
Ventricles:            Appears normal         Ductal Arch:            Not well visualized
Choroid Plexus:        Appears normal         Diaphragm:              Appears normal
Cerebellum:            Appears normal         Stomach:                Appears normal, left
sided
Posterior Fossa:       Appears normal         Abdomen:                Appears normal
Nuchal Fold:           Not applicable (>20    Abdominal Wall:         Appears nml (cord
wks GA)                                        insert, abd wall)
Face:                  Appears normal         Cord Vessels:           Appears normal (3
(orbits and profile)                           vessel cord)
Lips:                  Appears normal         Kidneys:                Appear normal
Palate:                Not well visualized    Bladder:                Appears normal
Thoracic:              Appears normal         Spine:                  Appears normal
Heart:                 Appears normal         Upper Extremities:      Visualized
(4CH, axis, and situs
RVOT:                  Appears normal         Lower Extremities:      Visualized

Other:  Fetus appears to be a male. Heels visualized. Nasal bone visualized.
Technically difficult due to fetal position.
Cervix Uterus Adnexa

Cervix
Length:            3.3  cm.
Normal appearance by transabdominal scan.

Left Ovary
Not visualized.

Right Ovary
Not visualized.

Adnexa:       No abnormality visualized.
Impression

Diamniotic Dichorionic intrauterine pregnancy at 20+2 weeks
Presentation is cephalic/
Normal amniotic fluid in each sac, with  MVP of 6 cm for A
and  6 cm for B
Estimated fetal weights are 348 g for A and 348 g for B, 50th
and 50th percentiles respectively. Discordance is <1%
Review of anatomy shows no evidence of structural
anomalies or sonographic markers for aneuploidy in either
twin
Evaluations of ductal arch in A and RVOT and arches in B
are suboptimal due to fetal positions
Normal cervix on transabdominal scan
Recommendations

Recommend follow-up ultrasound examination in 4 weeks for
completion of the anatomic survey and growth

## 2020-03-05 ENCOUNTER — Encounter: Payer: Self-pay | Admitting: Nurse Practitioner

## 2020-03-23 ENCOUNTER — Other Ambulatory Visit: Payer: Self-pay

## 2020-03-23 ENCOUNTER — Telehealth (INDEPENDENT_AMBULATORY_CARE_PROVIDER_SITE_OTHER): Payer: BC Managed Care – PPO | Admitting: Nurse Practitioner

## 2020-03-23 ENCOUNTER — Telehealth: Payer: Self-pay | Admitting: *Deleted

## 2020-03-23 DIAGNOSIS — F329 Major depressive disorder, single episode, unspecified: Secondary | ICD-10-CM | POA: Diagnosis not present

## 2020-03-23 DIAGNOSIS — F419 Anxiety disorder, unspecified: Secondary | ICD-10-CM

## 2020-03-23 DIAGNOSIS — F32A Depression, unspecified: Secondary | ICD-10-CM

## 2020-03-23 MED ORDER — SERTRALINE HCL 100 MG PO TABS: 100.0000 mg | ORAL_TABLET | Freq: Every day | ORAL | 1 refills | Status: DC

## 2020-03-23 NOTE — Progress Notes (Signed)
Video visit Subjective:    Patient ID: Barbara Marshall, female    DOB: 1986-10-22, 34 y.o.   MRN: [id number]  HPI  Patient calls for a follow up on anxiety and depression. Patient increase Zoloft to 2 pills daily notices some vertigo at times. Patient states she has been put of me for 2 weeks. See PHQ9  Virtual Visit via Video Note  I connected with Barbara Marshall on 03/23/20 at  3:20 PM EDT by a video enabled telemedicine application and verified that I am speaking with the correct person using two identifiers.  Location: Patient: home Provider: office   I discussed the limitations of evaluation and management by telemedicine and the availability of in person appointments. The patient expressed understanding and agreed to proceed.  History of Present Illness: Depression screen [facility] 2/9 03/23/2020 11/18/2019 10/21/2019 11/04/2017  Decreased Interest 0 1 1 1   Down, Depressed, Hopeless 1 1 2 1   PHQ - 2 Score 1 2 3 2   Altered sleeping 1 2 2 2   Tired, decreased energy - 1 2 2   Change in appetite 3 1 1  0  Feeling bad or failure about yourself  1 2 3 1   Trouble concentrating 0 2 1 1   Moving slowly or fidgety/restless 0 1 1 1   Suicidal thoughts 0 0 0 0  PHQ-9 Score 6 11 13 9   Difficult doing work/chores Not difficult at all Somewhat difficult Somewhat difficult Somewhat difficult   Presents for recheck on her depression and anxiety.  Patient ran out of her medication.  Was taking 100 mg Zoloft at that time.  Has had some occasional feeling "weird in the head".  Occasional "regular" headache.  Mild vertigo and dizziness.  No syncope.  No shortness of breath chest pain or palpitations.  Able to perform her normal activities.  Patient noticed while she was on Zoloft but symptoms have persisted 2 weeks after running out of her medication and patient would like to restart.  Takes an occasional Ativan as needed for sleep, not every night.  Denies suicidal or homicidal thoughts or ideation.    Observations/Objective: Format  Patient present at home Provider present at office Consent for interaction obtained Coronavirus outbreak made virtual visit necessary  NAD.  Alert, oriented.  Cheerful affect, smiling.  Thoughts logical coherent and relevant.  Assessment and Plan: Problem List Items Addressed This Visit      Other   Anxiety and depression - Primary   Relevant Medications   sertraline (ZOLOFT) 100 MG tablet     Meds ordered this encounter  Medications  . sertraline (ZOLOFT) 100 MG tablet    Sig: Take 1 tablet (100 mg total) by mouth daily.    Dispense:  90 tablet    Refill:  1    Order Specific Question:   Supervising Provider    Answer:   Sallee Lange A [[id number]]    Follow Up Instructions: Restart Zoloft 100 mg half tab p.o. daily for few days then increase to 1 tab p.o. daily.  Warning signs discussed regarding her mild vertigo symptoms.  Recommend face-to-face office visit if symptoms persist, sooner if worse or new symptoms develop. Return in about 6 months (around 09/22/2020).    I discussed the assessment and treatment plan with the patient. The patient was provided an opportunity to ask questions and all were answered. The patient agreed with the plan and demonstrated an understanding of the instructions.   The patient was advised to call back or seek  an in-person evaluation if the symptoms worsen or if the condition fails to improve as anticipated.  I provided 15 minutes of non-face-to-face time during this encounter.     Review of Systems     Objective:   Physical Exam        Assessment & Plan:

## 2020-03-23 NOTE — Telephone Encounter (Signed)
[name], caulfield are scheduled for a virtual visit with your provider today.    Just as we do with appointments in the office, we must obtain your consent to participate.  Your consent will be active for this visit and any virtual visit you may have with one of our providers in the next 365 days.    If you have a MyChart account, I can also send a copy of this consent to you electronically.  All virtual visits are billed to your insurance company just like a traditional visit in the office.  As this is a virtual visit, video technology does not allow for your provider to perform a traditional examination.  This may limit your provider's ability to fully assess your condition.  If your provider identifies any concerns that need to be evaluated in person or the need to arrange testing such as labs, EKG, etc, we will make arrangements to do so.    Although advances in technology are sophisticated, we cannot ensure that it will always work on either your end or our end.  If the connection with a video visit is poor, we may have to switch to a telephone visit.  With either a video or telephone visit, we are not always able to ensure that we have a secure connection.   I need to obtain your verbal consent now.   Are you willing to proceed with your visit today?   Barbara Marshall has provided verbal consent on 03/23/2020 for a virtual visit (video or telephone).   Kathleen Lime, RN 03/23/2020  2:39 PM

## 2020-03-24 ENCOUNTER — Encounter: Payer: Self-pay | Admitting: Nurse Practitioner

## 2020-05-07 IMAGING — US US MFM OB LIMITED
1 series · 14 of 27 positions shown · non-contrast
Comparison: none

[Series 1: us mfm ob limited · 14 of 27 slices shown]
[im 1/27]
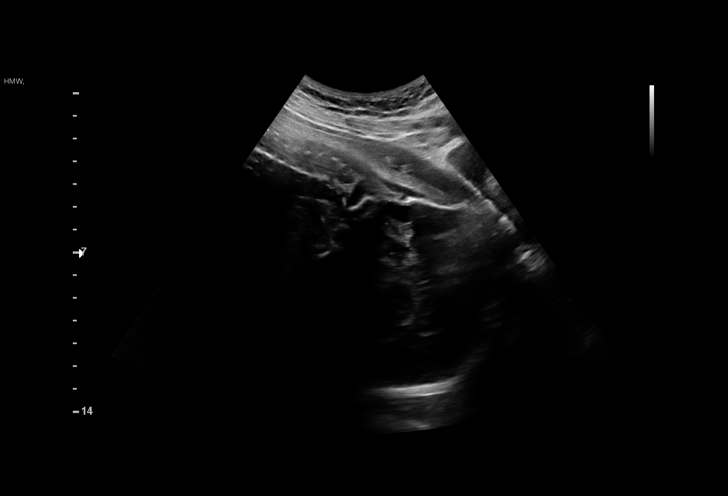
[im 3/27]
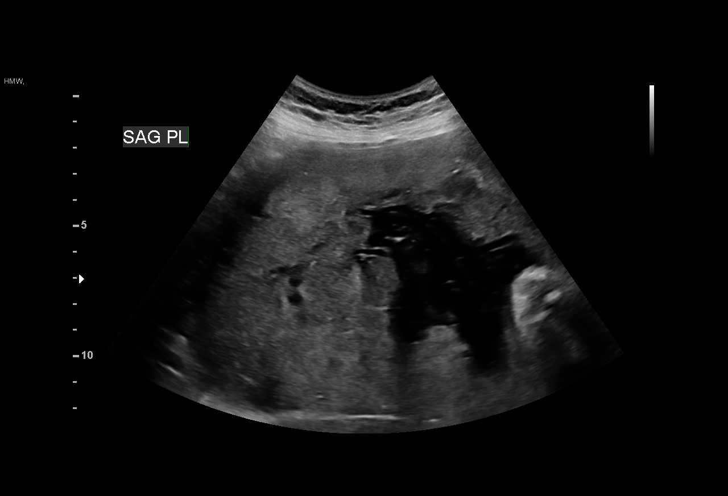
[im 5/27]
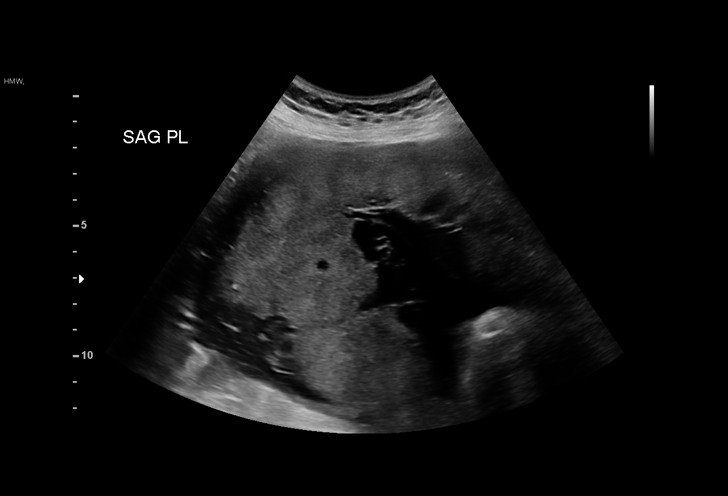
[im 7/27]
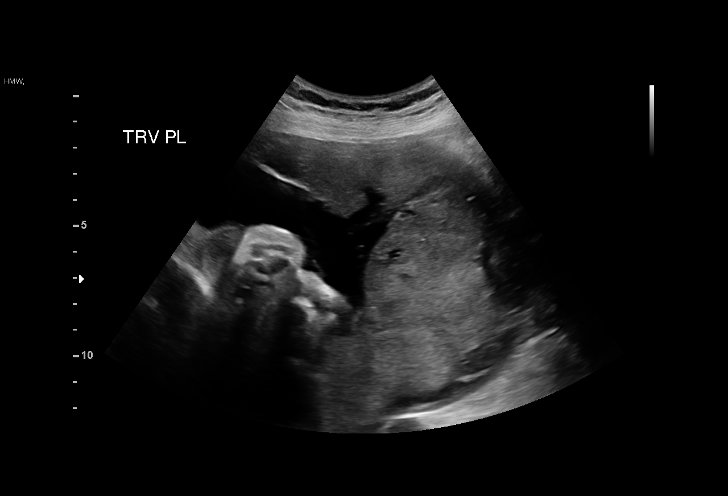
[im 9/27]
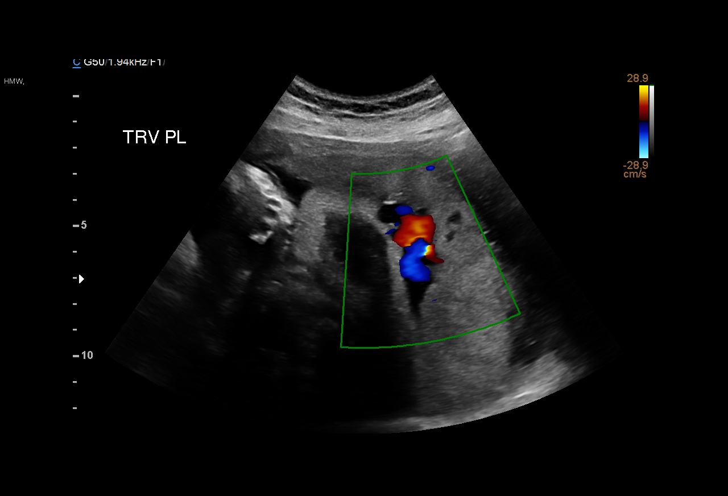
[im 11/27]
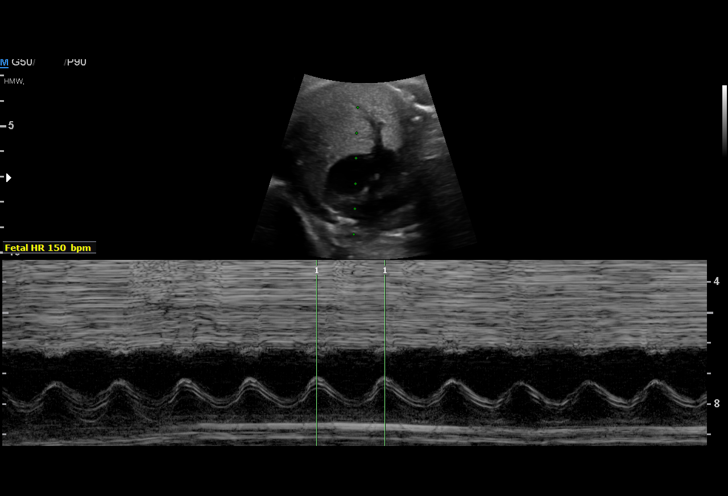
[im 13/27]
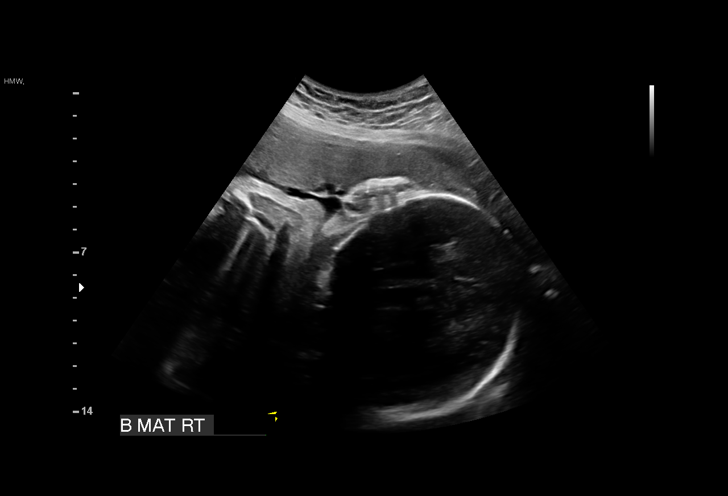
[im 15/27]
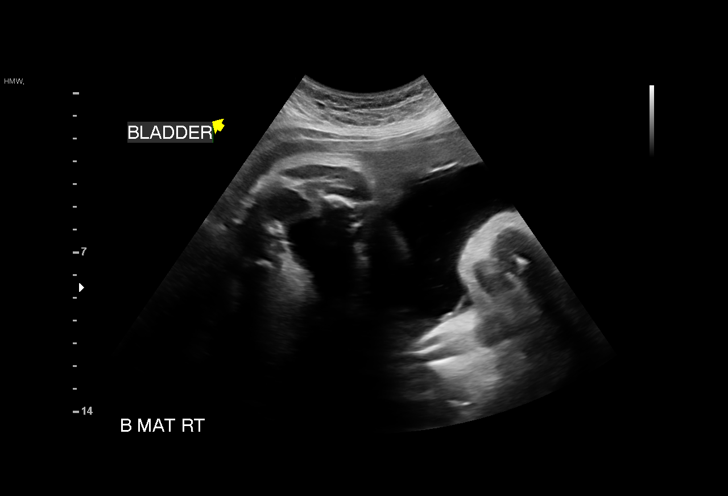
[im 17/27]
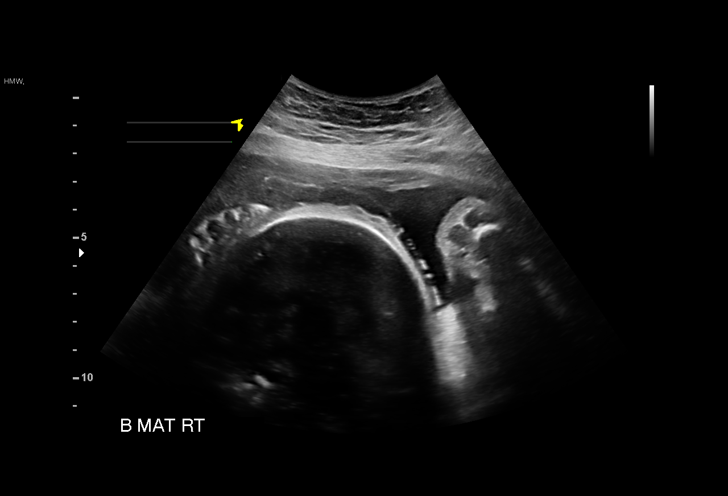
[im 19/27]
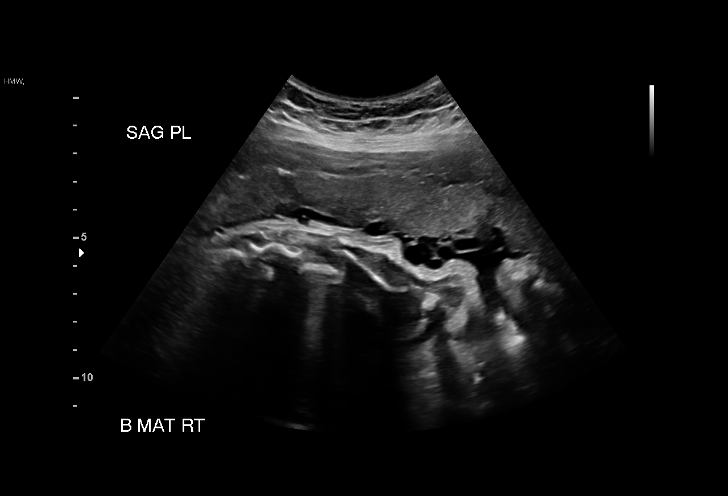
[im 21/27]
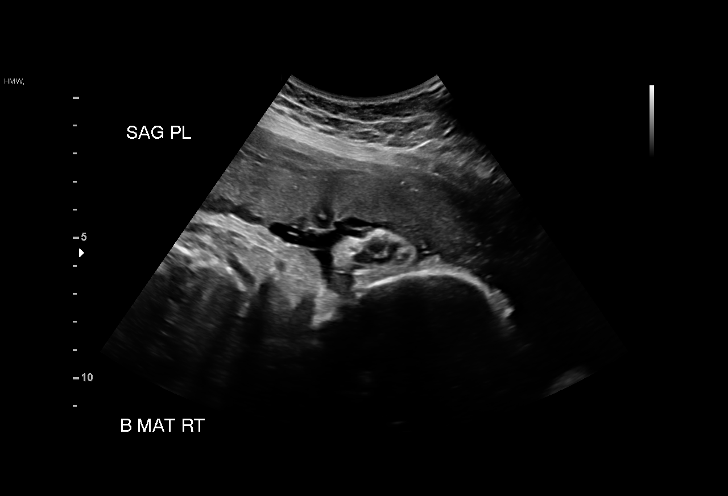
[im 23/27]
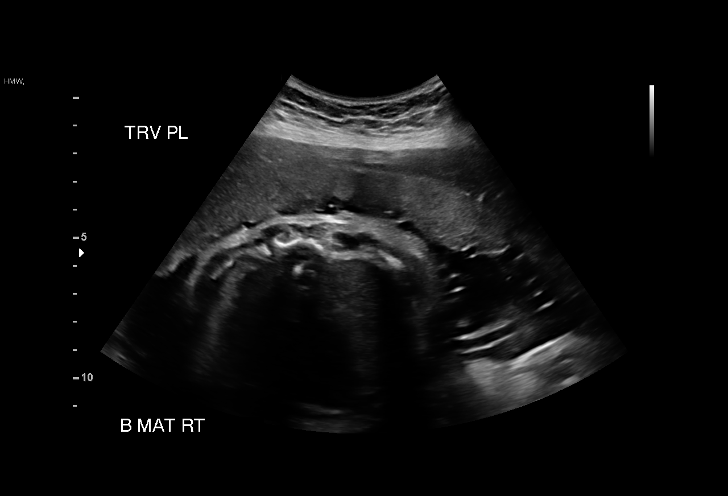
[im 25/27]
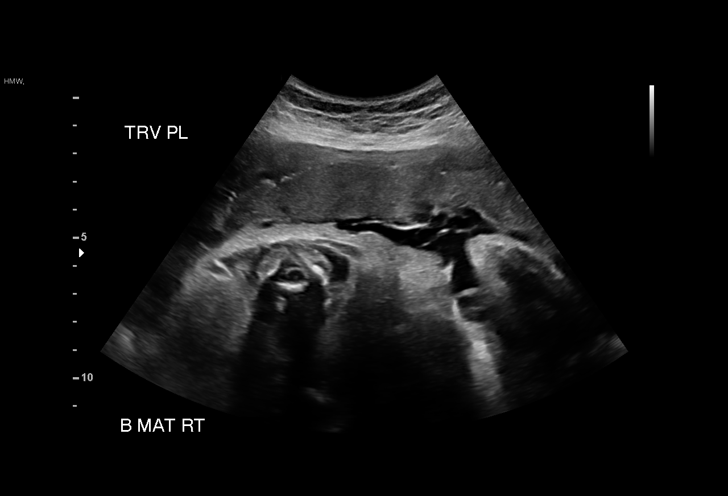
[im 27/27]
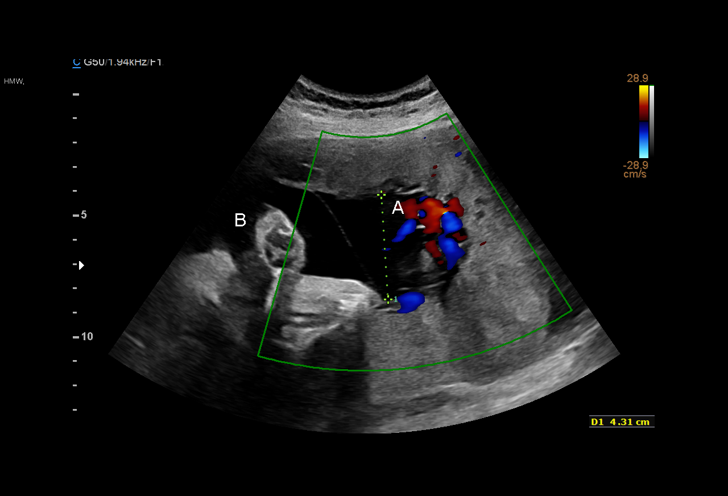

[14 of 27 positions shown; findings below may reference images not displayed]

#130

1  ULRYK EWA              [id number]      [id number]     [id number]
Indications

32 weeks gestation of pregnancy
HIV affecting pregnancy, third trimester
Obesity complicating pregnancy, third
trimester
Twin pregnancy, di/di, third trimester
Premature rupture of membranes - leaking
fluid
OB History

Gravidity:    1         Term:   0        Prem:   0        SAB:   0
TOP:          0       Ectopic:  0        Living: 0
Fetal Evaluation (Fetus A)

Num Of Fetuses:     2
Fetal Heart         150
Rate(bpm):
Cardiac Activity:   Observed
Presentation:       Cephalic
Placenta:           Posterior
P. Cord Insertion:  Visualized, central

Amniotic Fluid
AFI FV:      Subjectively decreased

Largest Pocket(cm)
4.5
Gestational Age (Fetus A)

LMP:           32w 5d        Date:  12/07/17                 EDD:   09/13/18
Best:          32w 5d     Det. By:  LMP  (12/07/17)          EDD:   09/13/18
Anatomy (Fetus A)

Stomach:               Appears normal, left   Bladder:                Appears normal
sided

Fetal Evaluation (Fetus B)

Num Of Fetuses:     2
Fetal Heart         150
Rate(bpm):
Cardiac Activity:   Observed
Presentation:       Cephalic
Placenta:           Anterior
P. Cord Insertion:  Previously Visualized

Amniotic Fluid
AFI FV:      Within normal limits

Largest Pocket(cm)
4.5
Gestational Age (Fetus B)

LMP:           32w 5d        Date:  12/07/17                 EDD:   09/13/18
Best:          32w 5d     Det. By:  LMP  (12/07/17)          EDD:   09/13/18
Anatomy (Fetus B)

Stomach:               Appears normal, left   Bladder:                Appears normal
sided
Impression

Di-Di Twin IUP at 32w 5d
PPROM Twin A
HIV infection.
Vtx/Vtx
Pre-Eclampsia
Recommendations

Discussed findings and plan with Primary Attending Dr. Eammon
Estanley and Dr. Larson - Given the undetectable VL
(compliant with HAART Tx and 2nd evaluation pending) and
stable preEclampsia , no evidence of labor or infection, plan
is for latency antibiotics and conservative management until
34 weeks gestation.  If clinical status changes (preE worsens,
labor or infection) then proceed with delivery.
Fetal testing scheduled for [REDACTED].

## 2020-08-06 DIAGNOSIS — B20 Human immunodeficiency virus [HIV] disease: Secondary | ICD-10-CM | POA: Diagnosis not present

## 2020-08-06 DIAGNOSIS — Z79899 Other long term (current) drug therapy: Secondary | ICD-10-CM | POA: Diagnosis not present

## 2020-08-06 DIAGNOSIS — I10 Essential (primary) hypertension: Secondary | ICD-10-CM | POA: Diagnosis not present

## 2020-08-06 DIAGNOSIS — F419 Anxiety disorder, unspecified: Secondary | ICD-10-CM | POA: Diagnosis not present

## 2020-08-15 DIAGNOSIS — F419 Anxiety disorder, unspecified: Secondary | ICD-10-CM | POA: Diagnosis not present

## 2020-10-11 DIAGNOSIS — B20 Human immunodeficiency virus [HIV] disease: Secondary | ICD-10-CM | POA: Diagnosis not present

## 2020-10-11 DIAGNOSIS — I10 Essential (primary) hypertension: Secondary | ICD-10-CM | POA: Diagnosis not present

## 2020-11-13 ENCOUNTER — Ambulatory Visit: Payer: BC Managed Care – PPO | Admitting: Family Medicine

## 2020-12-31 ENCOUNTER — Encounter: Payer: Self-pay | Admitting: Family Medicine

## 2020-12-31 ENCOUNTER — Ambulatory Visit (INDEPENDENT_AMBULATORY_CARE_PROVIDER_SITE_OTHER): Payer: BC Managed Care – PPO | Admitting: Family Medicine

## 2020-12-31 DIAGNOSIS — Z5329 Procedure and treatment not carried out because of patient's decision for other reasons: Secondary | ICD-10-CM

## 2021-01-15 ENCOUNTER — Ambulatory Visit (INDEPENDENT_AMBULATORY_CARE_PROVIDER_SITE_OTHER): Payer: BC Managed Care – PPO | Admitting: Family Medicine

## 2021-01-15 ENCOUNTER — Other Ambulatory Visit: Payer: Self-pay

## 2021-01-15 ENCOUNTER — Encounter: Payer: Self-pay | Admitting: Family Medicine

## 2021-01-15 VITALS — BP 122/80 | HR 117 | Temp 97.2°F | Ht 64.0 in | Wt 203.6 lb

## 2021-01-15 DIAGNOSIS — G2581 Restless legs syndrome: Secondary | ICD-10-CM | POA: Diagnosis not present

## 2021-01-15 DIAGNOSIS — F32A Depression, unspecified: Secondary | ICD-10-CM | POA: Diagnosis not present

## 2021-01-15 DIAGNOSIS — F419 Anxiety disorder, unspecified: Secondary | ICD-10-CM

## 2021-01-15 DIAGNOSIS — G4700 Insomnia, unspecified: Secondary | ICD-10-CM

## 2021-01-15 MED ORDER — GABAPENTIN 300 MG PO CAPS: ORAL_CAPSULE | ORAL | 1 refills | Status: DC

## 2021-01-15 MED ORDER — BUSPIRONE HCL 7.5 MG PO TABS: 7.5000 mg | ORAL_TABLET | Freq: Three times a day (TID) | ORAL | 0 refills | Status: DC

## 2021-01-15 NOTE — Progress Notes (Signed)
Patient ID: Barbara Marshall, female    DOB: 1986/03/20, 35 y.o.   MRN: [id number]   Chief Complaint  Patient presents with  . Anxiety   Subjective:    HPI Pt here to discuss anxiety. Pt was on Zoloft and Celexa and both increased RLS. Pt states she is needing something. Also having issues with sleep due to once being focused on something she will lay there and go over same scenario.   Pt stopped taking the zoloft and celexa and had worsening symptom with RLS. So needed to stop it.  Pt stating she took husband's gabapentin and helped.  Pt having racing thoughts.  Can't sleep and can't stop the thoughts. Can't get to sleep.    Seeing gyn and ID. Pt has h/o HIV.  Has twin 2 yr olds,  A lot going on.  Feeling at times overwhelmed with all with family and work. Having issues with work. Husband-Deputy and got shot in leg and medically retired in 2020.  Medical History Barbara Marshall has a past medical history of Anxiety, Asthma, Dysmenorrhea, Heart murmur, History of IBS, HIV infection (HCC), HSV infection, Plantar fasciitis, Pregnancy induced hypertension, and Trichomonas infection.   Outpatient Encounter Medications as of 01/15/2021  Medication Sig  . abacavir-dolutegravir-lamiVUDine (TRIUMEQ) 600-50-300 MG tablet TAKE 1 TABLET BY MOUTH EVERY DAY  . amLODipine (NORVASC) 5 MG tablet Take by mouth.  . busPIRone (BUSPAR) 7.5 MG tablet Take 1 tablet (7.5 mg total) by mouth 3 (three) times daily.  . Norethindrone Acetate-Ethinyl Estradiol (LOESTRIN) 1.5-30 MG-MCG tablet Take 1 tablet by mouth once.  Marland Kitchen PROAIR HFA 108 (90 Base) MCG/ACT inhaler TAKE 2 PUFFS BY MOUTH EVERY 6 HOURS AS NEEDED FOR WHEEZE OR SHORTNESS OF BREATH  . [DISCONTINUED] abacavir (ZIAGEN) 300 MG tablet abacavir 300 mg tablet  TAKE 2 TABLETS (600 MG TOTAL) BY MOUTH DAILY.  . [DISCONTINUED] ISENTRESS 400 MG tablet Take 400 mg by mouth 2 (two) times daily.  . [DISCONTINUED] lamivudine (EPIVIR) 300 MG tablet Take 300 mg by  mouth daily.  . [DISCONTINUED] LORazepam (ATIVAN) 0.5 MG tablet Take 1 tablet (0.5 mg total) by mouth at bedtime. Prn sleep  . [DISCONTINUED] naproxen (NAPROSYN) 500 MG tablet Take 1 tablet (500 mg total) by mouth 2 (two) times daily with a meal. PRN for pain  . [DISCONTINUED] Prenatal MV & Min w/FA-DHA (PRENATAL ADULT GUMMY/DHA/FA PO) Take 2 each by mouth daily.  . [DISCONTINUED] sertraline (ZOLOFT) 100 MG tablet Take 1 tablet (100 mg total) by mouth daily.   No facility-administered encounter medications on file as of 01/15/2021.     Review of Systems  Constitutional: Negative for chills and fever.  HENT: Negative for congestion, rhinorrhea and sore throat.   Respiratory: Negative for cough, shortness of breath and wheezing.   Cardiovascular: Negative for chest pain and leg swelling.  Gastrointestinal: Negative for abdominal pain, diarrhea, nausea and vomiting.  Genitourinary: Negative for dysuria and frequency.  Musculoskeletal: Negative for arthralgias and back pain.  Skin: Negative for rash.  Neurological: Negative for dizziness, weakness and headaches.  Psychiatric/Behavioral: Negative for dysphoric mood, self-injury, sleep disturbance and suicidal ideas. The patient is nervous/anxious.      Vitals BP 122/80   Pulse (!) 117   Temp (!) 97.2 F (36.2 C)   Ht 5\' 4"  (1.626 m)   Wt 203 lb 9.6 oz (92.4 kg)   SpO2 100%   BMI 34.95 kg/m   Objective:   Physical Exam Vitals and nursing note reviewed.  Constitutional:      Appearance: Normal appearance.  HENT:     Head: Normocephalic and atraumatic.     Nose: Nose normal.     Mouth/Throat:     Mouth: Mucous membranes are moist.     Pharynx: Oropharynx is clear.  Eyes:     Extraocular Movements: Extraocular movements intact.     Conjunctiva/sclera: Conjunctivae normal.     Pupils: Pupils are equal, round, and reactive to light.  Cardiovascular:     Rate and Rhythm: Regular rhythm. Tachycardia present.     Pulses: Normal  pulses.     Heart sounds: Normal heart sounds.  Pulmonary:     Effort: Pulmonary effort is normal.     Breath sounds: Normal breath sounds. No wheezing, rhonchi or rales.  Musculoskeletal:        General: Normal range of motion.     Right lower leg: No edema.     Left lower leg: No edema.  Skin:    General: Skin is warm and dry.     Findings: No lesion or rash.  Neurological:     General: No focal deficit present.     Mental Status: She is alert and oriented to person, place, and time.  Psychiatric:        Mood and Affect: Mood normal.        Behavior: Behavior normal.        Thought Content: Thought content normal.        Judgment: Judgment normal.      Assessment and Plan   1. Anxiety and depression - busPIRone (BUSPAR) 7.5 MG tablet; Take 1 tablet (7.5 mg total) by mouth 3 (three) times daily.  Dispense: 45 tablet; Refill: 0  2. RLS (restless legs syndrome)  3. Insomnia, unspecified type   RLS- Pt to check on the dose of gabapentin and call us back.  Will give small course of gabapentin to see if will help with rls.  Anxiety/insomnia- Adding buspar 3x per day.   Contraception- LMP- 2 mo ago, pt does cont pills, not always having regular periods. Cont with bcp.  F/u 3 wks phone visit or prn.

## 2021-01-22 ENCOUNTER — Other Ambulatory Visit: Payer: Self-pay | Admitting: Family Medicine

## 2021-02-04 DIAGNOSIS — I10 Essential (primary) hypertension: Secondary | ICD-10-CM | POA: Diagnosis not present

## 2021-02-04 DIAGNOSIS — F419 Anxiety disorder, unspecified: Secondary | ICD-10-CM | POA: Diagnosis not present

## 2021-02-04 DIAGNOSIS — Z79899 Other long term (current) drug therapy: Secondary | ICD-10-CM | POA: Diagnosis not present

## 2021-02-04 DIAGNOSIS — B20 Human immunodeficiency virus [HIV] disease: Secondary | ICD-10-CM | POA: Diagnosis not present

## 2021-02-05 ENCOUNTER — Telehealth (INDEPENDENT_AMBULATORY_CARE_PROVIDER_SITE_OTHER): Payer: BC Managed Care – PPO | Admitting: Family Medicine

## 2021-02-05 ENCOUNTER — Telehealth: Payer: Self-pay | Admitting: Family Medicine

## 2021-02-05 ENCOUNTER — Other Ambulatory Visit: Payer: Self-pay

## 2021-02-05 DIAGNOSIS — F419 Anxiety disorder, unspecified: Secondary | ICD-10-CM

## 2021-02-05 DIAGNOSIS — F32A Depression, unspecified: Secondary | ICD-10-CM

## 2021-02-05 MED ORDER — BUSPIRONE HCL 10 MG PO TABS: ORAL_TABLET | ORAL | 0 refills | Status: DC

## 2021-02-05 NOTE — Telephone Encounter (Signed)
Ms. Barbara Marshall, Barbara Marshall are scheduled for a virtual visit with your provider today.    Just as we do with appointments in the office, we must obtain your consent to participate.  Your consent will be active for this visit and any virtual visit you may have with one of our providers in the next 365 days.    If you have a MyChart account, I can also send a copy of this consent to you electronically.  All virtual visits are billed to your insurance company just like a traditional visit in the office.  As this is a virtual visit, video technology does not allow for your provider to perform a traditional examination.  This may limit your provider's ability to fully assess your condition.  If your provider identifies any concerns that need to be evaluated in person or the need to arrange testing such as labs, EKG, etc, we will make arrangements to do so.    Although advances in technology are sophisticated, we cannot ensure that it will always work on either your end or our end.  If the connection with a video visit is poor, we may have to switch to a telephone visit.  With either a video or telephone visit, we are not always able to ensure that we have a secure connection.   I need to obtain your verbal consent now.   Are you willing to proceed with your visit today?   Barbara Marshall has provided verbal consent on 02/05/2021 for a virtual visit (video or telephone).   Marlowe Shores, LPN 02/10/1442  15:40 AM

## 2021-02-05 NOTE — Progress Notes (Signed)
Patient ID: Barbara Marshall, female    DOB: December 29, 1985, 35 y.o.   MRN: [id number]   Virtual Visit via Video Note  I connected with Barbara Marshall on 02/05/21 at 11:00 AM EST by a video enabled telemedicine application and verified that I am speaking with the correct person using two identifiers.  Location: Patient: home Provider: office   I discussed the limitations of evaluation and management by telemedicine and the availability of in person appointments. The patient expressed understanding and agreed to proceed.  Chief Complaint  Patient presents with  . Anxiety   Subjective:    HPI Patient had a phone visit for follow up on anxiety.  Pt was seen 01/15/21 for anxiety/depression. Pt prescribed Buspar 7.5 mg TID. Pt states she feels the med is helping but she does not think she is quite where she needs to be.  Pt has visit with specialist yesterday and her blood pressure was elevated (152/106) and specialist told her to let PCP know.   Thinking buspar is helping, but not as controlled as she would like.  Feeling about 70% better. Tearful, anxiety still most of the day. occ at night having some irrational fears and can't sleep. Feeling slightly better at night, overall getting better sleep with gabapentin and buspar.  Taking gabapentin at night for RLS. Getting better, only one night that she didn't get good rest.  Medical History Barbara Marshall has a past medical history of Anxiety, Asthma, Dysmenorrhea, Heart murmur, History of IBS, HIV infection (HCC), HSV infection, Plantar fasciitis, Pregnancy induced hypertension, and Trichomonas infection.   Outpatient Encounter Medications as of 02/05/2021  Medication Sig  . abacavir-dolutegravir-lamiVUDine (TRIUMEQ) 600-50-300 MG tablet TAKE 1 TABLET BY MOUTH EVERY DAY  . amLODipine (NORVASC) 5 MG tablet Take by mouth.  . busPIRone (BUSPAR) 10 MG tablet Take 1-1.5 tab p.o. tid prn. Anxiety.  . gabapentin (NEURONTIN) 300 MG capsule Take  1-2 tab p.o. qhs.  . Norethindrone Acetate-Ethinyl Estradiol (LOESTRIN) 1.5-30 MG-MCG tablet Take 1 tablet by mouth once.  Marland Kitchen PROAIR HFA 108 (90 Base) MCG/ACT inhaler TAKE 2 PUFFS BY MOUTH EVERY 6 HOURS AS NEEDED FOR WHEEZE OR SHORTNESS OF BREATH  . [DISCONTINUED] busPIRone (BUSPAR) 7.5 MG tablet Take 1 tablet (7.5 mg total) by mouth 3 (three) times daily.   No facility-administered encounter medications on file as of 02/05/2021.     Review of Systems  Psychiatric/Behavioral: Positive for sleep disturbance. Negative for dysphoric mood, self-injury and suicidal ideas. The patient is nervous/anxious.   Gen: negative for fever, chills, fatigue  HEENT: negative for runny nose, sore throat, eye or ear pain/drainage CV: negative for chest pain, palpitations. Lungs: negative for cough, sob, wheezing. Abd: Negative for n/v/d, or abd pain. GU:  negative for pelvic pain, dysuria, hematuria, frequency or urgency. MSK: negative for neck/ arm/ leg pain, numbness, or weakness. Back: negative for back pain. Neuro: negative for numbness, tingling, paresthesia, weakness, or dizziness. Skin: negative for sores/lesions.  Vitals There were no vitals taken for this visit.  Objective:   Physical Exam  No PE due to phone visit.  Assessment and Plan   1. Anxiety and depression - busPIRone (BUSPAR) 10 MG tablet; Take 1-1.5 tab p.o. tid prn. Anxiety.  Dispense: 90 tablet; Refill: 0   Anxiety- not as well controlled.  Pt not wanting to go on ssri on last visit. Discussed with pt may retry-celexa or zoloft may retry at lower dose.  Since gabapentin helping at night now for RLS.  Inc in buspar to 10mg  tid or can use 15mg  qhs.   Will reck bp in office on next visit. All in normal ranges on last few readings.  Pt has lots of anxiety, and this may have caused the elevation in the office yesterday.  BP Readings from Last 3 Encounters:  01/15/21 122/80  11/18/19 118/76  10/21/19 130/82    Return in about  4 weeks (around 03/05/2021) for f/u anxiety-phone.   Follow Up Instructions:    I discussed the assessment and treatment plan with the patient. The patient was provided an opportunity to ask questions and all were answered. The patient agreed with the plan and demonstrated an understanding of the instructions.   The patient was advised to call back or seek an in-person evaluation if the symptoms worsen or if the condition fails to improve as anticipated.  I provided 15 minutes of non-face-to-face time during this encounter.

## 2021-02-17 ENCOUNTER — Encounter: Payer: Self-pay | Admitting: Family Medicine

## 2021-02-23 ENCOUNTER — Other Ambulatory Visit: Payer: Self-pay | Admitting: Nurse Practitioner

## 2021-03-05 ENCOUNTER — Telehealth (INDEPENDENT_AMBULATORY_CARE_PROVIDER_SITE_OTHER): Payer: BC Managed Care – PPO | Admitting: Family Medicine

## 2021-03-05 ENCOUNTER — Other Ambulatory Visit: Payer: Self-pay

## 2021-03-05 DIAGNOSIS — F32A Depression, unspecified: Secondary | ICD-10-CM

## 2021-03-05 DIAGNOSIS — F419 Anxiety disorder, unspecified: Secondary | ICD-10-CM

## 2021-03-05 MED ORDER — BUPROPION HCL ER (XL) 150 MG PO TB24: 150.0000 mg | ORAL_TABLET | Freq: Every day | ORAL | 1 refills | Status: DC

## 2021-03-05 NOTE — Progress Notes (Signed)
Patient ID: MEKLIT COTTA, female    DOB: Sep 08, 1986, 35 y.o.   MRN: [id number]   Virtual Visit via Telephone Note  I connected with Barbara Marshall on 03/05/21 at 11:00 AM EDT by telephone and verified that I am speaking with the correct person using two identifiers.  Location: Patient: home Provider: office   I discussed the limitations, risks, security and privacy concerns of performing an evaluation and management service by telephone and the availability of in person appointments. I also discussed with the patient that there may be a patient responsible charge related to this service. The patient expressed understanding and agreed to proceed.   Chief Complaint  Patient presents with  . Anxiety   Subjective:    HPI  Cc- anxiety/depression.  Feeling not as well controlled with anxiety. pt states she is taking buspar and feels like she needs something else added to it.  Having tearfulness and excessive worry about minor things.  Things are negative majority of the time.  Hard to see positive. Some times having low energy.  Some days are good. Has gone up to 10mg  buspar taking it tid.  occ taking 1.5 tab if needed.  Most times 3x per day during week, on weekend about 2x per day. Had helped calm down some of the anxiety.  Not helping much with sleep.   In 2018- pt was on celexa and xanax. Xanax was 0.5mg  1/2 tab bid prn anxiety. Tried celexa and lexapro in past. Pt was on 0.5mg  ativan at bedtime in 2021 Pt tried zoloft in 2020-2021. Then didn't return till 2/22 for follow up on the anxiety.   Medical History Barbara Marshall has a past medical history of Anxiety, Asthma, Dysmenorrhea, Heart murmur, History of IBS, HIV infection (HCC), HSV infection, Plantar fasciitis, Pregnancy induced hypertension, and Trichomonas infection.   Outpatient Encounter Medications as of 03/05/2021  Medication Sig  . abacavir-dolutegravir-lamiVUDine (TRIUMEQ) 600-50-300 MG tablet TAKE 1 TABLET  BY MOUTH EVERY DAY  . amLODipine (NORVASC) 5 MG tablet Take by mouth.  03/07/2021 buPROPion (WELLBUTRIN XL) 150 MG 24 hr tablet Take 1 tablet (150 mg total) by mouth daily.  . busPIRone (BUSPAR) 10 MG tablet Take 1-1.5 tab p.o. tid prn. Anxiety.  . gabapentin (NEURONTIN) 300 MG capsule Take 1-2 tab p.o. qhs.  . Norethindrone Acetate-Ethinyl Estradiol (LOESTRIN) 1.5-30 MG-MCG tablet Take 1 tablet by mouth once.  10-11-1978 PROAIR HFA 108 (90 Base) MCG/ACT inhaler TAKE 2 PUFFS BY MOUTH EVERY 6 HOURS AS NEEDED FOR WHEEZE OR SHORTNESS OF BREATH   No facility-administered encounter medications on file as of 03/05/2021.     Review of Systems  Constitutional: Negative for chills and fever.  HENT: Negative for congestion, rhinorrhea and sore throat.   Respiratory: Negative for cough, shortness of breath and wheezing.   Cardiovascular: Negative for chest pain and leg swelling.  Gastrointestinal: Negative for abdominal pain, diarrhea, nausea and vomiting.  Genitourinary: Negative for dysuria and frequency.  Musculoskeletal: Negative for arthralgias and back pain.  Skin: Negative for rash.  Neurological: Negative for dizziness, weakness and headaches.  Psychiatric/Behavioral: Positive for dysphoric mood and sleep disturbance. Negative for self-injury and suicidal ideas. The patient is nervous/anxious (slight improvement).      Vitals There were no vitals taken for this visit.  Objective:   Physical Exam  No PE due to phone visit.  Assessment and Plan   1. Anxiety and depression - buPROPion (WELLBUTRIN XL) 150 MG 24 hr tablet; Take 1 tablet (150 mg  total) by mouth daily.  Dispense: 30 tablet; Refill: 1    Discussion of going back and trying another ssri again, since pt is being treated with gabapentin for RLS at night.  Since pt felt that celexa caused RLS sympotms yrs ago.  discussed starting a low dose of zoloft.  But then pt hesitant and worried about the restless legs returning with using another  SSRI. Pt wanted to try wellbutrin first.  Discussed concerns that this may be activating for some pt and if large anxiety component then can worsen the anxiety. Pt voiced understanding and to call or mychart if worsening symptoms.   Return in about 6 weeks (around 04/16/2021) for f/u anxiety-phone.  03/05/2021    Follow Up Instructions:    I discussed the assessment and treatment plan with the patient. The patient was provided an opportunity to ask questions and all were answered. The patient agreed with the plan and demonstrated an understanding of the instructions.   The patient was advised to call back or seek an in-person evaluation if the symptoms worsen or if the condition fails to improve as anticipated.  I provided 15 minutes of non-face-to-face time during this encounter.

## 2021-03-18 ENCOUNTER — Other Ambulatory Visit: Payer: Self-pay | Admitting: Family Medicine

## 2021-03-28 ENCOUNTER — Other Ambulatory Visit: Payer: Self-pay | Admitting: Family Medicine

## 2021-03-28 DIAGNOSIS — F32A Depression, unspecified: Secondary | ICD-10-CM

## 2021-03-28 DIAGNOSIS — F419 Anxiety disorder, unspecified: Secondary | ICD-10-CM

## 2021-04-16 ENCOUNTER — Other Ambulatory Visit: Payer: Self-pay

## 2021-04-16 ENCOUNTER — Telehealth (INDEPENDENT_AMBULATORY_CARE_PROVIDER_SITE_OTHER): Payer: BC Managed Care – PPO | Admitting: Family Medicine

## 2021-04-16 DIAGNOSIS — F32A Depression, unspecified: Secondary | ICD-10-CM | POA: Diagnosis not present

## 2021-04-16 DIAGNOSIS — F419 Anxiety disorder, unspecified: Secondary | ICD-10-CM | POA: Diagnosis not present

## 2021-04-16 MED ORDER — BUPROPION HCL ER (XL) 150 MG PO TB24
1.0000 | ORAL_TABLET | Freq: Every day | ORAL | 0 refills | Status: DC
Start: 2021-06-16 — End: 2021-09-06

## 2021-04-16 MED ORDER — GABAPENTIN 300 MG PO CAPS: ORAL_CAPSULE | ORAL | 5 refills | Status: DC

## 2021-04-16 MED ORDER — BUSPIRONE HCL 10 MG PO TABS: ORAL_TABLET | ORAL | 0 refills | Status: DC

## 2021-04-16 NOTE — Progress Notes (Signed)
Patient ID: Barbara Marshall, female    DOB: 1986-03-08, 35 y.o.   MRN: [id number]   Virtual Visit via Telephone Note  I connected with Barbara Marshall on 04/16/21 at  1:10 PM EDT by telephone and verified that I am speaking with the correct person using two identifiers.  Location: Patient: home Provider: office   I discussed the limitations, risks, security and privacy concerns of performing an evaluation and management service by telephone and the availability of in person appointments. I also discussed with the patient that there may be a patient responsible charge related to this service. The patient expressed understanding and agreed to proceed.  Chief Complaint  Patient presents with  . Depression    Anxiety follow up- doing well on current meds   Subjective:    HPI   F/u anxiety and RLS-  Not needing it buspar, 1x per day 2x per week.  Taking wellbutrin 150mg  xl.  Has been feeling this dose is working well.    Taking 600mg  at night for gabapentin for insomnia and RLS.  Doing better with this.  Sleeping better.  Medical History Barbara Marshall has a past medical history of Anxiety, Asthma, Dysmenorrhea, Heart murmur, History of IBS, HIV infection (HCC), HSV infection, Plantar fasciitis, Pregnancy induced hypertension, and Trichomonas infection.   Outpatient Encounter Medications as of 04/16/2021  Medication Sig  . abacavir-dolutegravir-lamiVUDine (TRIUMEQ) 600-50-300 MG tablet TAKE 1 TABLET BY MOUTH EVERY DAY  . amLODipine (NORVASC) 5 MG tablet Take by mouth.  [name] ON 06/16/2021] buPROPion (WELLBUTRIN XL) 150 MG 24 hr tablet Take 1 tablet (150 mg total) by mouth daily.  Melene Muller ON 06/16/2021] busPIRone (BUSPAR) 10 MG tablet Take 1-1.5 tab p.o. tid prn. Anxiety.  . gabapentin (NEURONTIN) 300 MG capsule TAKE 1-2 TABLETS AT BEDTIME  . Norethindrone Acetate-Ethinyl Estradiol (LOESTRIN) 1.5-30 MG-MCG tablet Take 1 tablet by mouth once.  08/17/2021 PROAIR HFA 108 (90 Base) MCG/ACT  inhaler TAKE 2 PUFFS BY MOUTH EVERY 6 HOURS AS NEEDED FOR WHEEZE OR SHORTNESS OF BREATH  . [DISCONTINUED] buPROPion (WELLBUTRIN XL) 150 MG 24 hr tablet TAKE 1 TABLET BY MOUTH EVERY DAY  . [DISCONTINUED] busPIRone (BUSPAR) 10 MG tablet Take 1-1.5 tab p.o. tid prn. Anxiety.  . [DISCONTINUED] gabapentin (NEURONTIN) 300 MG capsule TAKE 1-2 TABLETS AT BEDTIME   No facility-administered encounter medications on file as of 04/16/2021.     Review of Systems  Constitutional: Negative for chills and fever.  HENT: Negative for congestion, rhinorrhea and sore throat.   Respiratory: Negative for cough, shortness of breath and wheezing.   Cardiovascular: Negative for chest pain and leg swelling.  Gastrointestinal: Negative for abdominal pain, diarrhea, nausea and vomiting.  Genitourinary: Negative for dysuria and frequency.  Musculoskeletal: Negative for arthralgias and back pain.  Skin: Negative for rash.  Neurological: Negative for dizziness, weakness and headaches.  Psychiatric/Behavioral: Negative for dysphoric mood, self-injury, sleep disturbance and suicidal ideas. The patient is not nervous/anxious.      Vitals There were no vitals taken for this visit.  Objective:   Physical Exam No PE due to phone visit.   Assessment and Plan   1. Anxiety and depression - buPROPion (WELLBUTRIN XL) 150 MG 24 hr tablet; Take 1 tablet (150 mg total) by mouth daily.  Dispense: 90 tablet; Refill: 0 - busPIRone (BUSPAR) 10 MG tablet; Take 1-1.5 tab p.o. tid prn. Anxiety.  Dispense: 90 tablet; Refill: 0   Depression/anxiety- improved. Pt doing well with buspar and buproprion.  Insomnia and RLS- improving,  doing well with 600mg  gabapentin for this.    Return in about 6 months (around 10/17/2021) for f/u RLS, anxiety.    Follow Up Instructions:    I discussed the assessment and treatment plan with the patient. The patient was provided an opportunity to ask questions and all were answered. The  patient agreed with the plan and demonstrated an understanding of the instructions.   The patient was advised to call back or seek an in-person evaluation if the symptoms worsen or if the condition fails to improve as anticipated.  I provided 15 minutes of non-face-to-face time during this encounter.

## 2021-04-28 ENCOUNTER — Encounter: Payer: Self-pay | Admitting: Family Medicine

## 2021-05-30 ENCOUNTER — Encounter: Payer: Self-pay | Admitting: *Deleted

## 2021-06-12 ENCOUNTER — Ambulatory Visit (INDEPENDENT_AMBULATORY_CARE_PROVIDER_SITE_OTHER): Payer: BC Managed Care – PPO | Admitting: Family Medicine

## 2021-06-12 ENCOUNTER — Encounter: Payer: Self-pay | Admitting: Family Medicine

## 2021-06-12 ENCOUNTER — Ambulatory Visit (HOSPITAL_COMMUNITY)
Admission: RE | Admit: 2021-06-12 | Discharge: 2021-06-12 | Disposition: A | Discharge status: Home / Self Care | Payer: BC Managed Care – PPO | Source: Ambulatory Visit | Attending: Family Medicine | Admitting: Family Medicine

## 2021-06-12 ENCOUNTER — Telehealth: Payer: Self-pay | Admitting: Family Medicine

## 2021-06-12 ENCOUNTER — Other Ambulatory Visit: Payer: Self-pay

## 2021-06-12 ENCOUNTER — Other Ambulatory Visit (HOSPITAL_COMMUNITY)
Admission: RE | Admit: 2021-06-12 | Discharge: 2021-06-12 | Disposition: A | Discharge status: Home / Self Care | Payer: BC Managed Care – PPO | Source: Ambulatory Visit | Attending: Family Medicine | Admitting: Family Medicine

## 2021-06-12 VITALS — BP 134/86 | HR 118 | Temp 98.2°F | Ht 64.0 in | Wt 206.0 lb

## 2021-06-12 DIAGNOSIS — R110 Nausea: Secondary | ICD-10-CM | POA: Diagnosis not present

## 2021-06-12 DIAGNOSIS — R1011 Right upper quadrant pain: Secondary | ICD-10-CM

## 2021-06-12 LAB — CBC WITH DIFFERENTIAL/PLATELET
Abs Immature Granulocytes: 0.03 10*3/uL (ref 0.00–0.07)
Basophils Absolute: 0 10*3/uL (ref 0.0–0.1)
Basophils Relative: 0 %
Eosinophils Absolute: 0.1 10*3/uL (ref 0.0–0.5)
Eosinophils Relative: 1 %
HCT: 38.7 % (ref 36.0–46.0)
Hemoglobin: 13.2 g/dL (ref 12.0–15.0)
Immature Granulocytes: 0 %
Lymphocytes Relative: 21 %
Lymphs Abs: 2.2 10*3/uL (ref 0.7–4.0)
MCH: 31.1 pg (ref 26.0–34.0)
MCHC: 34.1 g/dL (ref 30.0–36.0)
MCV: 91.1 fL (ref 80.0–100.0)
Monocytes Absolute: 0.8 10*3/uL (ref 0.1–1.0)
Monocytes Relative: 8 %
Neutro Abs: 7.6 10*3/uL (ref 1.7–7.7)
Neutrophils Relative %: 70 %
Platelets: 276 10*3/uL (ref 150–400)
RBC: 4.25 MIL/uL (ref 3.87–5.11)
RDW: 12.4 % (ref 11.5–15.5)
WBC: 10.9 10*3/uL — ABNORMAL HIGH (ref 4.0–10.5)
nRBC: 0 % (ref 0.0–0.2)

## 2021-06-12 LAB — COMPREHENSIVE METABOLIC PANEL
ALT: 28 U/L (ref 0–44)
AST: 20 U/L (ref 15–41)
Albumin: 3.9 g/dL (ref 3.5–5.0)
Alkaline Phosphatase: 90 U/L (ref 38–126)
Anion gap: 6 (ref 5–15)
BUN: 7 mg/dL (ref 6–20)
CO2: 26 mmol/L (ref 22–32)
Calcium: 8.8 mg/dL — ABNORMAL LOW (ref 8.9–10.3)
Chloride: 103 mmol/L (ref 98–111)
Creatinine, Ser: 0.73 mg/dL (ref 0.44–1.00)
GFR, Estimated: >60 mL/min (ref >60)
Glucose, Bld: 113 mg/dL — ABNORMAL HIGH (ref 70–99)
Potassium: 4 mmol/L (ref 3.5–5.1)
Sodium: 135 mmol/L (ref 135–145)
Total Bilirubin: 0.9 mg/dL (ref 0.3–1.2)
Total Protein: 8.6 g/dL — ABNORMAL HIGH (ref 6.5–8.1)

## 2021-06-12 LAB — POCT URINALYSIS DIP (MANUAL ENTRY)
Bilirubin, UA: NEGATIVE
Glucose, UA: NEGATIVE mg/dL
Ketones, POC UA: NEGATIVE mg/dL
Leukocytes, UA: NEGATIVE
Nitrite, UA: NEGATIVE
Protein Ur, POC: 30 mg/dL — AB
Spec Grav, UA: 1.02 (ref 1.010–1.025)
Urobilinogen, UA: 0.2 E.U./dL
pH, UA: 5 (ref 5.0–8.0)

## 2021-06-12 LAB — POCT URINE PREGNANCY: Preg Test, Ur: NEGATIVE

## 2021-06-12 LAB — AMYLASE: Amylase: 50 U/L (ref 28–100)

## 2021-06-12 LAB — LIPASE, BLOOD: Lipase: 23 U/L (ref 11–51)

## 2021-06-12 MED ORDER — ESOMEPRAZOLE MAGNESIUM 40 MG PO CPDR: 40.0000 mg | DELAYED_RELEASE_CAPSULE | Freq: Every day | ORAL | 1 refills | Status: DC

## 2021-06-12 NOTE — Telephone Encounter (Signed)
Pt would like the results of the ultrasound when completed. Pt would like a call back when results are in

## 2021-06-12 NOTE — Telephone Encounter (Signed)
Called and scheduled an appt for today with dr Ladona Ridgel

## 2021-06-12 NOTE — Progress Notes (Signed)
Patient ID: Barbara Marshall, female    DOB: 1985-12-23, 35 y.o.   MRN: [id number]   Chief Complaint  Patient presents with   pain in abd after meals    Pain in R middle back 2 to 3 days    Subjective:    HPI 2 day sago on rt side and migrated to epigastric pain. Worse yesterday.  Tender in epigastric and in btw breast.  Hurting to breath and at times feeling pain is radiating to her back. Worse with eating. Hurting to sneeze or cough or laughing Normal bm,  No blood or black stool.  No dysuria or hematuria. No fever.  No n/v today. Has some nausea yesterday. No nsaid use.   No h/o gerd- not on a med. Has not eaten today, and pain is starting to get worse this am. No surgeries on abd.   Medical History Barbara Marshall has a past medical history of Anxiety, Asthma, Dysmenorrhea, Heart murmur, History of IBS, HIV infection ([place]), HSV infection, Plantar fasciitis, Pregnancy induced hypertension, and Trichomonas infection.   Outpatient Encounter Medications as of 06/12/2021  Medication Sig   abacavir-dolutegravir-lamiVUDine (TRIUMEQ) 600-50-300 MG tablet TAKE 1 TABLET BY MOUTH EVERY DAY   amLODipine (NORVASC) 5 MG tablet Take by mouth.   [START ON 06/16/2021] buPROPion (WELLBUTRIN XL) 150 MG 24 hr tablet Take 1 tablet (150 mg total) by mouth daily.   [START ON 06/16/2021] busPIRone (BUSPAR) 10 MG tablet Take 1-1.5 tab p.o. tid prn. Anxiety.   gabapentin (NEURONTIN) 300 MG capsule TAKE 1-2 TABLETS AT BEDTIME   PROAIR HFA 108 (90 Base) MCG/ACT inhaler TAKE 2 PUFFS BY MOUTH EVERY 6 HOURS AS NEEDED FOR WHEEZE OR SHORTNESS OF BREATH   [DISCONTINUED] Norethindrone Acetate-Ethinyl Estradiol (LOESTRIN) 1.5-30 MG-MCG tablet Take 1 tablet by mouth once.   No facility-administered encounter medications on file as of 06/12/2021.     Review of Systems  Constitutional:  Negative for chills and fever.  HENT:  Negative for congestion, rhinorrhea and sore throat.   Respiratory:  Negative for cough,  shortness of breath and wheezing.   Cardiovascular:  Negative for chest pain and leg swelling.  Gastrointestinal:  Positive for abdominal pain (rt upper quad and epigastric pain) and nausea. Negative for blood in stool, constipation, diarrhea and vomiting.  Genitourinary:  Negative for difficulty urinating, dysuria, frequency, hematuria, urgency, vaginal bleeding, vaginal discharge and vaginal pain.  Musculoskeletal:  Negative for arthralgias and back pain.  Skin:  Negative for rash.  Neurological:  Negative for dizziness, weakness and headaches.    Vitals BP 134/86   Pulse (!) 118   Temp 98.2 F (36.8 C)   Ht 5' 4"  (1.626 m)   Wt 206 lb (93.4 kg)   SpO2 98%   BMI 35.36 kg/m   Objective:   Physical Exam Vitals and nursing note reviewed.  Constitutional:      General: She is in acute distress (mild due to pain).     Appearance: Normal appearance.  HENT:     Head: Normocephalic and atraumatic.     Nose: Nose normal.     Mouth/Throat:     Mouth: Mucous membranes are moist.     Pharynx: Oropharynx is clear.  Eyes:     Extraocular Movements: Extraocular movements intact.     Conjunctiva/sclera: Conjunctivae normal.     Pupils: Pupils are equal, round, and reactive to light.  Cardiovascular:     Rate and Rhythm: Regular rhythm. Tachycardia present.     Pulses:  Normal pulses.     Heart sounds: Normal heart sounds.  Pulmonary:     Effort: Pulmonary effort is normal.     Breath sounds: Normal breath sounds. No wheezing, rhonchi or rales.  Abdominal:     General: Bowel sounds are normal. There is no distension.     Palpations: Abdomen is soft. There is no mass.     Tenderness: There is abdominal tenderness (rt upper quad, +murphy's sign and +ttp epigastric). There is no guarding or rebound.     Hernia: No hernia is present.  Musculoskeletal:        General: Normal range of motion.     Right lower leg: No edema.     Left lower leg: No edema.  Skin:    General: Skin is warm  and dry.     Findings: No lesion or rash.  Neurological:     General: No focal deficit present.     Mental Status: She is alert and oriented to person, place, and time.     Cranial Nerves: No cranial nerve deficit.  Psychiatric:        Mood and Affect: Mood normal.        Behavior: Behavior normal.     Assessment and Plan   1. Right upper quadrant pain - US Abdomen Limited RUQ (LIVER/GB); Future - CMP14+EGFR - Lipase - Amylase - CBC With Differential - POCT urinalysis dipstick - POCT urine pregnancy   Will order stat u/s and stat labs.  Possible gallbladder disease, retained stone, or gerd/PUD.  Return if symptoms worsen or fail to improve.  Addendum- urine grew out Enterococcus faecalis- will send in macrbid.

## 2021-06-16 LAB — URINE CULTURE

## 2021-06-16 LAB — SPECIMEN STATUS REPORT

## 2021-06-18 ENCOUNTER — Encounter: Payer: Self-pay | Admitting: Family Medicine

## 2021-06-18 MED ORDER — NITROFURANTOIN MONOHYD MACRO 100 MG PO CAPS: 100.0000 mg | ORAL_CAPSULE | Freq: Two times a day (BID) | ORAL | 0 refills | Status: DC

## 2021-07-03 ENCOUNTER — Ambulatory Visit: Payer: BC Managed Care – PPO | Admitting: Family Medicine

## 2021-07-04 ENCOUNTER — Other Ambulatory Visit: Payer: Self-pay | Admitting: Family Medicine

## 2021-07-28 ENCOUNTER — Other Ambulatory Visit: Payer: Self-pay

## 2021-07-28 ENCOUNTER — Ambulatory Visit
Admission: EM | Admit: 2021-07-28 | Discharge: 2021-07-28 | Disposition: A | Discharge status: Home / Self Care | Payer: BC Managed Care – PPO | Attending: Physician Assistant | Admitting: Physician Assistant

## 2021-07-28 ENCOUNTER — Encounter: Payer: Self-pay | Admitting: Emergency Medicine

## 2021-07-28 DIAGNOSIS — B029 Zoster without complications: Secondary | ICD-10-CM

## 2021-07-28 MED ORDER — VALACYCLOVIR HCL 1 G PO TABS: 1000.0000 mg | ORAL_TABLET | Freq: Three times a day (TID) | ORAL | 0 refills | Status: AC

## 2021-07-28 NOTE — ED Provider Notes (Signed)
[facility]    CSN: [id number] Arrival date & time: 07/28/21  1223      History   Chief Complaint No chief complaint on file.   HPI Barbara Marshall is a 35 y.o. female.   Pt complains of a burning painful rash to right forehead that started 4 days ago. She reports some tingling and itching.  She has tried nothing for the sx.  Denies fever, chills, n/v/d.   Past Medical History:  Diagnosis Date   Anxiety    Asthma    Dysmenorrhea    H/O   Heart murmur    History of IBS    HIV infection (HCC)    HSV infection    Plantar fasciitis    Pregnancy induced hypertension    Trichomonas infection     Patient Active Problem List   Diagnosis Date Noted   Anxiety and depression 10/21/2019   Seasonal and perennial allergic rhinitis 09/14/2018   Mild intermittent asthma without complication 09/14/2018   Not immune to rubella 07/26/2018   Preterm premature rupture of membranes (PPROM) with onset of labor after 24 hours of rupture in third trimester, antepartum 07/26/2018   Twin pregnancy, dichorionic/diamniotic, third trimester 07/26/2018   Pregnancy 07/26/2018   Severe preeclampsia, third trimester 07/22/2018   Hypertension affecting pregnancy 07/21/2018   Human immunodeficiency virus infection (HCC) 01/09/2018   Human immunodeficiency virus (HIV) disease (HCC) 11/29/2016    Past Surgical History:  Procedure Laterality Date   WISDOM TOOTH EXTRACTION      OB History     Gravida  1   Para  1   Term      Preterm  1   AB      Living  2      SAB      IAB      Ectopic      Multiple  1   Live Births  2            Home Medications    Prior to Admission medications   Medication Sig Start Date End Date Taking? Authorizing Provider  valACYclovir (VALTREX) 1000 MG tablet Take 1 tablet (1,000 mg total) by mouth 3 (three) times daily for 7 days. 07/28/21 08/04/21 Yes Ward, Tylene Fantasia, PA-C  abacavir-dolutegravir-lamiVUDine (TRIUMEQ) 600-50-300  MG tablet TAKE 1 TABLET BY MOUTH EVERY DAY 11/24/18   [provider]  amLODipine (NORVASC) 5 MG tablet Take by mouth. 09/29/19   [provider]  buPROPion (WELLBUTRIN XL) 150 MG 24 hr tablet Take 1 tablet (150 mg total) by mouth daily. 06/16/21   Ladona Ridgel, Malena M, DO  busPIRone (BUSPAR) 10 MG tablet Take 1-1.5 tab p.o. tid prn. Anxiety. 06/16/21   Laroy Apple M, DO  esomeprazole (NEXIUM) 40 MG capsule Take 1 capsule (40 mg total) by mouth daily. 06/12/21   Laroy Apple M, DO  gabapentin (NEURONTIN) 300 MG capsule TAKE 1-2 TABLETS AT BEDTIME 04/16/21   Ladona Ridgel, Malena M, DO  nitrofurantoin, macrocrystal-monohydrate, (MACROBID) 100 MG capsule Take 1 capsule (100 mg total) by mouth 2 (two) times daily. 06/18/21   Ladona Ridgel, Malena M, DO  PROAIR HFA 108 (90 Base) MCG/ACT inhaler TAKE 2 PUFFS BY MOUTH EVERY 6 HOURS AS NEEDED FOR WHEEZE OR SHORTNESS OF BREATH 12/27/18   Alfonse Spruce, MD    Family History Family History  Problem Relation Age of Onset   Cancer Father        BRAIN   Diabetes Father    Asthma  Father    Thyroid disease Mother    Migraines Mother    Allergic rhinitis Mother    Eczema Neg Hx    Urticaria Neg Hx     Social History Social History   Tobacco Use   Smoking status: Never   Smokeless tobacco: Never  Vaping Use   Vaping Use: Never used  Substance Use Topics   Alcohol use: Yes    Comment: SOCIAL   Drug use: No     Allergies   Citalopram   Review of Systems Review of Systems  Constitutional:  Negative for chills and fever.  HENT:  Negative for ear pain and sore throat.   Eyes:  Negative for pain and visual disturbance.  Respiratory:  Negative for cough and shortness of breath.   Cardiovascular:  Negative for chest pain and palpitations.  Gastrointestinal:  Negative for abdominal pain and vomiting.  Genitourinary:  Negative for dysuria and hematuria.  Musculoskeletal:  Negative for arthralgias and back pain.  Skin:  Positive for  rash. Negative for color change.  Neurological:  Negative for seizures and syncope.  All other systems reviewed and are negative.   Physical Exam Triage Vital Signs ED Triage Vitals  Enc Vitals Group     BP 07/28/21 1312 (!) 165/96     Pulse Rate 07/28/21 1312 (!) 112     Resp 07/28/21 1312 16     Temp 07/28/21 1312 (!) 89.4 F (31.9 C)     Temp Source 07/28/21 1312 Oral     SpO2 07/28/21 1312 98 %     Weight --      Height --      Head Circumference --      Peak Flow --      Pain Score 07/28/21 1313 3     Pain Loc --      Pain Edu? --      Excl. in GC? --    No data found.  Updated Vital Signs BP (!) 165/96 (BP Location: Right Arm)   Pulse (!) 112   Temp (!) 89.4 F (31.9 C) (Oral)   Resp 16   LMP 07/14/2021 (Approximate)   SpO2 98%   Visual Acuity Right Eye Distance:   Left Eye Distance:   Bilateral Distance:    Right Eye Near:   Left Eye Near:    Bilateral Near:     Physical Exam Vitals and nursing note reviewed.  Constitutional:      General: She is not in acute distress.    Appearance: She is well-developed.  HENT:     Head: Normocephalic and atraumatic.   Eyes:     Conjunctiva/sclera: Conjunctivae normal.  Cardiovascular:     Rate and Rhythm: Normal rate and regular rhythm.     Heart sounds: No murmur heard. Pulmonary:     Effort: Pulmonary effort is normal. No respiratory distress.     Breath sounds: Normal breath sounds.  Abdominal:     Palpations: Abdomen is soft.     Tenderness: There is no abdominal tenderness.  Musculoskeletal:     Cervical back: Neck supple.  Skin:    General: Skin is warm and dry.  Neurological:     Mental Status: She is alert.     UC Treatments / Results  Labs (all labs ordered are listed, but only abnormal results are displayed) Labs Reviewed - No data to display  EKG   Radiology No results found.  Procedures Procedures (including critical care time)  Medications Ordered in UC Medications - No  data to display  Initial Impression / Assessment and Plan / UC Course  I have reviewed the triage vital signs and the nursing notes.  Pertinent labs & imaging results that were available during my care of the patient were reviewed by me and considered in my medical decision making (see chart for details).     Shingles, valtrex prescribed.  Return precautions discussed.  Final Clinical Impressions(s) / UC Diagnoses   Final diagnoses:  Herpes zoster without complication     Discharge Instructions      Take medication as prescribed Keep area covered   ED Prescriptions     Medication Sig Dispense Auth. Provider   valACYclovir (VALTREX) 1000 MG tablet Take 1 tablet (1,000 mg total) by mouth 3 (three) times daily for 7 days. 21 tablet Ward, Tylene Fantasia, PA-C      PDMP not reviewed this encounter.   Ward, Tylene Fantasia, PA-C 07/28/21 1351

## 2021-07-28 NOTE — ED Triage Notes (Signed)
Rash on right side of forehead since Tuesday, pain radiates down to ear and up to head.  States it tingles, pain worse at night time

## 2021-07-28 NOTE — Discharge Instructions (Addendum)
Take medication as prescribed Keep area covered

## 2021-08-01 DIAGNOSIS — Z01419 Encounter for gynecological examination (general) (routine) without abnormal findings: Secondary | ICD-10-CM | POA: Diagnosis not present

## 2021-08-01 DIAGNOSIS — R87810 Cervical high risk human papillomavirus (HPV) DNA test positive: Secondary | ICD-10-CM | POA: Diagnosis not present

## 2021-08-01 DIAGNOSIS — Z760 Encounter for issue of repeat prescription: Secondary | ICD-10-CM | POA: Diagnosis not present

## 2021-08-01 DIAGNOSIS — Z124 Encounter for screening for malignant neoplasm of cervix: Secondary | ICD-10-CM | POA: Diagnosis not present

## 2021-08-01 DIAGNOSIS — B029 Zoster without complications: Secondary | ICD-10-CM | POA: Diagnosis not present

## 2021-09-06 ENCOUNTER — Other Ambulatory Visit: Payer: Self-pay | Admitting: Family Medicine

## 2021-09-06 DIAGNOSIS — F419 Anxiety disorder, unspecified: Secondary | ICD-10-CM

## 2021-09-06 DIAGNOSIS — F32A Depression, unspecified: Secondary | ICD-10-CM

## 2021-10-28 DIAGNOSIS — Z23 Encounter for immunization: Secondary | ICD-10-CM | POA: Diagnosis not present

## 2021-10-28 DIAGNOSIS — M722 Plantar fascial fibromatosis: Secondary | ICD-10-CM | POA: Diagnosis not present

## 2021-10-28 DIAGNOSIS — F17200 Nicotine dependence, unspecified, uncomplicated: Secondary | ICD-10-CM | POA: Diagnosis not present

## 2021-10-28 DIAGNOSIS — I10 Essential (primary) hypertension: Secondary | ICD-10-CM | POA: Diagnosis not present

## 2021-10-28 DIAGNOSIS — Z2821 Immunization not carried out because of patient refusal: Secondary | ICD-10-CM | POA: Diagnosis not present

## 2021-10-28 DIAGNOSIS — Z79899 Other long term (current) drug therapy: Secondary | ICD-10-CM | POA: Diagnosis not present

## 2021-10-28 DIAGNOSIS — Z2839 Other underimmunization status: Secondary | ICD-10-CM | POA: Diagnosis not present

## 2021-10-28 DIAGNOSIS — B20 Human immunodeficiency virus [HIV] disease: Secondary | ICD-10-CM | POA: Diagnosis not present

## 2021-10-29 DIAGNOSIS — M722 Plantar fascial fibromatosis: Secondary | ICD-10-CM | POA: Insufficient documentation

## 2021-10-29 HISTORY — DX: Plantar fascial fibromatosis: M72.2

## 2022-01-07 ENCOUNTER — Other Ambulatory Visit: Payer: Self-pay | Admitting: Family Medicine

## 2022-02-24 ENCOUNTER — Other Ambulatory Visit: Payer: Self-pay | Admitting: Family Medicine

## 2022-03-24 ENCOUNTER — Ambulatory Visit (INDEPENDENT_AMBULATORY_CARE_PROVIDER_SITE_OTHER): Payer: BC Managed Care – PPO | Admitting: Family Medicine

## 2022-03-24 ENCOUNTER — Encounter: Payer: Self-pay | Admitting: Family Medicine

## 2022-03-24 VITALS — BP 155/107 | HR 138 | Temp 98.4°F | Wt 205.8 lb

## 2022-03-24 DIAGNOSIS — J020 Streptococcal pharyngitis: Secondary | ICD-10-CM | POA: Insufficient documentation

## 2022-03-24 DIAGNOSIS — I10 Essential (primary) hypertension: Secondary | ICD-10-CM | POA: Insufficient documentation

## 2022-03-24 LAB — POCT RAPID STREP A (OFFICE): Rapid Strep A Screen: POSITIVE — AB

## 2022-03-24 MED ORDER — AMOXICILLIN 500 MG PO CAPS: 500.0000 mg | ORAL_CAPSULE | Freq: Two times a day (BID) | ORAL | 0 refills | Status: AC

## 2022-03-24 NOTE — Progress Notes (Signed)
? ?Subjective:  ?Patient ID: STERLING Marshall, female    DOB: [birth year]  Age: 36 y.o. MRN: [id number] ? ?CC: ?Chief Complaint  ?Patient presents with  ? Sore Throat  ?  Fever, facial congestion, when laying down feels like she cant breath   ? ? ?HPI: ? ?36 year old female presents for evaluation of the above. ? ?Symptoms started yesterday.  She reports sore throat and low-grade temperature.  Also has had some congestion.  No reported sick contacts.  No relieving factors.  No other complaints or concerns at this time. ? ?Patient Active Problem List  ? Diagnosis Date Noted  ? Hypertension 03/24/2022  ? Strep pharyngitis 03/24/2022  ? Anxiety and depression 10/21/2019  ? Seasonal and perennial allergic rhinitis 09/14/2018  ? Mild intermittent asthma without complication 09/14/2018  ? Human immunodeficiency virus (HIV) disease (HCC) 11/29/2016  ? ? ?Social Hx   ?Social History  ? ?Socioeconomic History  ? Marital status: Married  ?  Spouse name: Not on file  ? Number of children: Not on file  ? Years of education: Not on file  ? Highest education level: Not on file  ?Occupational History  ? Not on file  ?Tobacco Use  ? Smoking status: Never  ? Smokeless tobacco: Never  ?Vaping Use  ? Vaping Use: Never used  ?Substance and Sexual Activity  ? Alcohol use: Yes  ?  Comment: SOCIAL  ? Drug use: No  ? Sexual activity: Not Currently  ?  Birth control/protection: Condom  ?Other Topics Concern  ? Not on file  ?Social History Narrative  ? Not on file  ? ?Social Determinants of Health  ? ?Financial Resource Strain: Not on file  ?Food Insecurity: Not on file  ?Transportation Needs: Not on file  ?Physical Activity: Not on file  ?Stress: Not on file  ?Social Connections: Not on file  ? ? ?Review of Systems ?Per HPI ? ?Objective:  ?BP (!) 155/107   Pulse (!) 138   Temp 98.4 ?F (36.9 ?C)   Wt 205 lb 12.8 oz (93.4 kg)   SpO2 97%   BMI 35.33 kg/m?  ? ? ?  03/24/2022  ? 11:19 AM 07/28/2021  ?  1:12 PM 06/12/2021  ?  9:37 AM  ?BP/Weight   ?Systolic BP 155 165 134  ?Diastolic BP 107 96 86  ?Wt. (Lbs) 205.8  206  ?BMI 35.33 kg/m2  35.36 kg/m2  ? ? ?Physical Exam ?Vitals and nursing note reviewed.  ?Constitutional:   ?   General: She is not in acute distress. ?   Appearance: She is obese.  ?HENT:  ?   Head: Normocephalic and atraumatic.  ?   Right Ear: Tympanic membrane normal.  ?   Left Ear: Tympanic membrane normal.  ?   Mouth/Throat:  ?   Comments: Oropharynx -enlarged tonsils with exudate. ?Cardiovascular:  ?   Rate and Rhythm: Normal rate and regular rhythm.  ?Pulmonary:  ?   Effort: Pulmonary effort is normal.  ?   Breath sounds: Normal breath sounds. No wheezing, rhonchi or rales.  ?Musculoskeletal:  ?   Cervical back: Tenderness present.  ?Lymphadenopathy:  ?   Cervical: Cervical adenopathy present.  ?Neurological:  ?   Mental Status: She is alert.  ? ? ?Lab Results  ?Component Value Date  ? WBC 10.9 (H) 06/12/2021  ? HGB 13.2 06/12/2021  ? HCT 38.7 06/12/2021  ? PLT 276 06/12/2021  ? GLUCOSE 113 (H) 06/12/2021  ? CHOL 151 11/28/2016  ? TRIG  83 11/28/2016  ? HDL 55 11/28/2016  ? LDLCALC 79 11/28/2016  ? ALT 28 06/12/2021  ? AST 20 06/12/2021  ? NA 135 06/12/2021  ? K 4.0 06/12/2021  ? CL 103 06/12/2021  ? CREATININE 0.73 06/12/2021  ? BUN 7 06/12/2021  ? CO2 26 06/12/2021  ? TSH 1.210 11/28/2016  ? HGBA1C 5.0 01/07/2017  ? ? ? ?Assessment & Plan:  ? ?Problem List Items Addressed This Visit   ? ?  ? Respiratory  ? Strep pharyngitis - Primary  ?  Rapid strep positive.  Treating with amoxicillin. ? ?  ?  ? Relevant Medications  ? amoxicillin (AMOXIL) 500 MG capsule  ? Other Relevant Orders  ? POCT rapid strep A (Completed)  ? ? ?Meds ordered this encounter  ?Medications  ? amoxicillin (AMOXIL) 500 MG capsule  ?  Sig: Take 1 capsule (500 mg total) by mouth 2 (two) times daily for 10 days.  ?  Dispense:  20 capsule  ?  Refill:  0  ? ?Everlene Other DO ?[facility] ? ?

## 2022-03-24 NOTE — Assessment & Plan Note (Signed)
Rapid strep positive.  Treating with amoxicillin. 

## 2022-03-31 ENCOUNTER — Encounter: Payer: Self-pay | Admitting: Family Medicine

## 2022-04-01 ENCOUNTER — Ambulatory Visit
Admission: EM | Admit: 2022-04-01 | Discharge: 2022-04-01 | Disposition: A | Discharge status: Home / Self Care | Payer: BC Managed Care – PPO

## 2022-04-01 DIAGNOSIS — J069 Acute upper respiratory infection, unspecified: Secondary | ICD-10-CM

## 2022-04-01 MED ORDER — FLUTICASONE PROPIONATE 50 MCG/ACT NA SUSP: 2.0000 | Freq: Every day | NASAL | 0 refills | Status: AC

## 2022-04-01 MED ORDER — PROMETHAZINE-DM 6.25-15 MG/5ML PO SYRP: 5.0000 mL | ORAL_SOLUTION | Freq: Four times a day (QID) | ORAL | 0 refills | Status: DC | PRN

## 2022-04-01 MED ORDER — CETIRIZINE HCL 10 MG PO TABS: 10.0000 mg | ORAL_TABLET | Freq: Every day | ORAL | 0 refills | Status: AC

## 2022-04-01 NOTE — Discharge Instructions (Addendum)
Finish your antibiotic. ?Take medication as prescribed. ?Increase fluids and get plenty of rest. ?Take ibuprofen or Tylenol for pain, fever, or general discomfort. ?Use a humidifier or sleep elevated on 2 pillows to help with drainage, nasal congestion, and cough. ?Follow-up if you develop worsening symptoms to include fever, chills, worsening cough, shortness of breath or difficulty breathing. ?

## 2022-04-01 NOTE — ED Triage Notes (Signed)
Pt reports neck pain, cough, congestion and fever x 3 days. Pt was diagnosed with Strep 1 week ago and is currently taking amoxicillin.  ?

## 2022-04-01 NOTE — ED Provider Notes (Signed)
?[place] ? ? ? ?CSN: [id number] ?Arrival date & time: 04/01/22  1000 ? ? ?  ? ?History   ?Chief Complaint ?Chief Complaint  ?Patient presents with  ? Neck Pain  ? Cough  ? Nasal Congestion  ? Fever  ? ? ?HPI ?KYRIANNA PETTITT is a 36 y.o. female.  ? ?The patient is a 36 year old female who presents for continued upper respiratory symptoms.  Patient states she was seen by her PCP approximately 1 week ago and diagnosed with strep throat.  She states she was prescribed amoxicillin, and has 1 day of the antibiotics left.  She states over the last 24 to 48 hours, she has developed nasal congestion, runny nose, and a productive cough.  She also states that she has had some generalized fatigue since her symptoms worsen.  She reports that she a swollen lymph node on the left side of her throat.  She has been eating and drinking without difficulty. ? ? ?Neck Pain ?Associated symptoms: fever   ?Cough ?Associated symptoms: fever   ?Associated symptoms: no ear pain and no sore throat   ?Fever ?Associated symptoms: congestion and cough   ?Associated symptoms: no ear pain and no sore throat   ? ?Past Medical History:  ?Diagnosis Date  ? Anxiety   ? Asthma   ? Dysmenorrhea   ? H/O  ? Heart murmur   ? History of IBS   ? HIV infection ([place])   ? HSV infection   ? Plantar fasciitis   ? Pregnancy induced hypertension   ? Trichomonas infection   ? ? ?Patient Active Problem List  ? Diagnosis Date Noted  ? Hypertension 03/24/2022  ? Strep pharyngitis 03/24/2022  ? Anxiety and depression 10/21/2019  ? Seasonal and perennial allergic rhinitis 09/14/2018  ? Mild intermittent asthma without complication [id number]  ? Human immunodeficiency virus (HIV) disease ([place]) 11/29/2016  ? ? ?Past Surgical History:  ?Procedure Laterality Date  ? WISDOM TOOTH EXTRACTION    ? ? ?OB History   ? ? Gravida  ?1  ? Para  ?1  ? Term  ?   ? Preterm  ?1  ? AB  ?   ? Living  ?2  ?  ? ? SAB  ?   ? IAB  ?   ? Ectopic  ?   ? Multiple  ?1  ? Live Births   ?2  ?   ?  ?  ? ? ? ?Home Medications   ? ?Prior to Admission medications   ?Medication Sig Start Date End Date Taking? Authorizing Provider  ?cetirizine (ZYRTEC) 10 MG tablet Take 1 tablet (10 mg total) by mouth daily. 04/01/22  Yes Cullen Vanallen-Warren, Alda Lea, NP  ?fluticasone (FLONASE) 50 MCG/ACT nasal spray Place 2 sprays into both nostrils daily. 04/01/22  Yes Peighton Mehra-Warren, Alda Lea, NP  ?promethazine-dextromethorphan (PROMETHAZINE-DM) 6.25-15 MG/5ML syrup Take 5 mLs by mouth 4 (four) times daily as needed for cough. 04/01/22  Yes Maira Christon-Warren, Alda Lea, NP  ?abacavir-dolutegravir-lamiVUDine (TRIUMEQ) 600-50-300 MG tablet TAKE 1 TABLET BY MOUTH EVERY DAY 11/24/18   [provider]  ?amLODipine (NORVASC) 5 MG tablet Take by mouth. 09/29/19   [provider]  ?amoxicillin (AMOXIL) 500 MG capsule Take 1 capsule (500 mg total) by mouth 2 (two) times daily for 10 days. 03/24/22 04/03/22  Coral Spikes, DO  ?gabapentin (NEURONTIN) 300 MG capsule TAKE 1-2 TABLETS BY MOUTH AT BEDTIME 01/08/22   Kathyrn Drown, MD  ?norethindrone-ethinyl estradiol-iron (LOESTRIN FE) 1.5-30  MG-MCG tablet Junel FE 1.5/30 (28) 1.5 mg-30 mcg (21)/75 mg (7) tablet    [provider]  ?PROAIR HFA 108 (90 Base) MCG/ACT inhaler TAKE 2 PUFFS BY MOUTH EVERY 6 HOURS AS NEEDED FOR WHEEZE OR SHORTNESS OF BREATH 12/27/18   Valentina Shaggy, MD  ? ? ?Family History ?Family History  ?Problem Relation Age of Onset  ? Cancer Father   ?     BRAIN  ? Diabetes Father   ? Asthma Father   ? Thyroid disease Mother   ? Migraines Mother   ? Allergic rhinitis Mother   ? Eczema Neg Hx   ? Urticaria Neg Hx   ? ? ?Social History ?Social History  ? ?Tobacco Use  ? Smoking status: Never  ? Smokeless tobacco: Never  ?Vaping Use  ? Vaping Use: Never used  ?Substance Use Topics  ? Alcohol use: Not Currently  ?  Comment: SOCIAL  ? Drug use: No  ? ? ? ?Allergies   ?Citalopram ? ? ?Review of Systems ?Review of Systems  ?Constitutional:  Positive  for fever.  ?HENT:  Positive for congestion, postnasal drip and sinus pressure. Negative for ear pain and sore throat.   ?Eyes: Negative.   ?Respiratory:  Positive for cough.   ?Cardiovascular: Negative.   ?Gastrointestinal: Negative.   ?Musculoskeletal:  Positive for neck pain.  ?Psychiatric/Behavioral: Negative.    ? ? ?Physical Exam ?Triage Vital Signs ?ED Triage Vitals  ?Enc Vitals Group  ?   BP 04/01/22 1118 (!) 150/91  ?   Pulse Rate 04/01/22 1118 (!) 118  ?   Resp 04/01/22 1118 18  ?   Temp 04/01/22 1118 98.7 ?F (37.1 ?C)  ?   Temp Source 04/01/22 1118 Oral  ?   SpO2 04/01/22 1118 97 %  ?   Weight --   ?   Height --   ?   Head Circumference --   ?   Peak Flow --   ?   Pain Score 04/01/22 1119 5  ?   Pain Loc --   ?   Pain Edu? --   ?   Excl. in [place]? --   ? ?No data found. ? ?Updated Vital Signs ?BP (!) 150/91 (BP Location: Right Arm)   Pulse (!) 118   Temp 98.7 ?F (37.1 ?C) (Oral)   Resp 18   SpO2 97%   Breastfeeding No  ? ?Visual Acuity ?Right Eye Distance:   ?Left Eye Distance:   ?Bilateral Distance:   ? ?Right Eye Near:   ?Left Eye Near:    ?Bilateral Near:    ? ?Physical Exam ?Vitals reviewed.  ?Constitutional:   ?   General: She is not in acute distress. ?   Appearance: Normal appearance.  ?HENT:  ?   Head: Normocephalic.  ?   Right Ear: Tympanic membrane, ear canal and external ear normal.  ?   Left Ear: Tympanic membrane, ear canal and external ear normal.  ?   Nose: Congestion and rhinorrhea present.  ?   Mouth/Throat:  ?   Pharynx: No oropharyngeal exudate or posterior oropharyngeal erythema.  ?Eyes:  ?   Extraocular Movements: Extraocular movements intact.  ?   Conjunctiva/sclera: Conjunctivae normal.  ?   Pupils: Pupils are equal, round, and reactive to light.  ?Cardiovascular:  ?   Rate and Rhythm: Regular rhythm. Tachycardia present.  ?   Pulses: Normal pulses.  ?   Heart sounds: Normal heart sounds.  ?Pulmonary:  ?  Effort: Pulmonary effort is normal. No respiratory distress.  ?   Breath  sounds: Normal breath sounds. No wheezing or rales.  ?Abdominal:  ?   General: Bowel sounds are normal.  ?   Palpations: Abdomen is soft.  ?Musculoskeletal:  ?   Cervical back: Normal range of motion.  ?Lymphadenopathy:  ?   Cervical: Cervical adenopathy (left) present.  ?Skin: ?   General: Skin is warm and dry.  ?Neurological:  ?   General: No focal deficit present.  ?   Mental Status: She is alert and oriented to person, place, and time.  ?Psychiatric:     ?   Mood and Affect: Mood normal.     ?   Behavior: Behavior normal.  ? ? ? ?UC Treatments / Results  ?Labs ?(all labs ordered are listed, but only abnormal results are displayed) ?Labs Reviewed - No data to display ? ?EKG ? ? ?Radiology ?No results found. ? ?Procedures ?Procedures (including critical care time) ? ?Medications Ordered in UC ?Medications - No data to display ? ?Initial Impression / Assessment and Plan / UC Course  ?I have reviewed the triage vital signs and the nursing notes. ? ?Pertinent labs & imaging results that were available during my care of the patient were reviewed by me and considered in my medical decision making (see chart for details). ? ?The patient is a 36 year old female who presents for continued upper respiratory symptoms.  Patient was treated for strep with amoxicillin and currently has a 1 day of medication remaining.  Now she has nasal congestion, runny nose, and a productive cough.  She also is concerned about a swollen lymph node in her left neck.  On exam, her lungs were clear, she is in no acute distress.  Her heart rate is slightly elevated.  She does have mild cervical adenopathy on the left side.  We will start with conservative treatment to include Promethazine DM to help with her cough, fluticasone for her congestion, and Zyrtec to help with her allergy symptoms.  Patient advised to provide supportive care to include increasing fluids and getting plenty of rest.  Warm salt water gargles 3-4 times daily as needed.   Strict return precautions provided.  Patient verbalizes understanding.  Follow-up as needed. ?Final Clinical Impressions(s) / UC Diagnoses  ? ?Final diagnoses:  ?Acute upper respiratory infection  ? ? ? ?Discha

## 2022-04-28 ENCOUNTER — Other Ambulatory Visit: Payer: Self-pay | Admitting: Family Medicine

## 2022-04-28 DIAGNOSIS — B20 Human immunodeficiency virus [HIV] disease: Secondary | ICD-10-CM | POA: Diagnosis not present

## 2022-04-28 DIAGNOSIS — F419 Anxiety disorder, unspecified: Secondary | ICD-10-CM | POA: Diagnosis not present

## 2022-04-28 DIAGNOSIS — I10 Essential (primary) hypertension: Secondary | ICD-10-CM | POA: Diagnosis not present

## 2022-04-28 DIAGNOSIS — F5105 Insomnia due to other mental disorder: Secondary | ICD-10-CM | POA: Diagnosis not present

## 2022-04-28 DIAGNOSIS — F418 Other specified anxiety disorders: Secondary | ICD-10-CM | POA: Diagnosis not present

## 2022-04-28 DIAGNOSIS — Z79899 Other long term (current) drug therapy: Secondary | ICD-10-CM | POA: Diagnosis not present

## 2022-04-29 DIAGNOSIS — F418 Other specified anxiety disorders: Secondary | ICD-10-CM | POA: Insufficient documentation

## 2022-04-29 DIAGNOSIS — F5105 Insomnia due to other mental disorder: Secondary | ICD-10-CM

## 2022-04-29 HISTORY — DX: Other specified anxiety disorders: F51.05

## 2022-04-29 HISTORY — DX: Other specified anxiety disorders: F41.8

## 2022-07-02 DIAGNOSIS — I10 Essential (primary) hypertension: Secondary | ICD-10-CM | POA: Diagnosis not present

## 2022-07-10 ENCOUNTER — Encounter: Payer: Self-pay | Admitting: Nurse Practitioner

## 2022-07-10 ENCOUNTER — Ambulatory Visit (INDEPENDENT_AMBULATORY_CARE_PROVIDER_SITE_OTHER): Payer: BC Managed Care – PPO | Admitting: Nurse Practitioner

## 2022-07-10 VITALS — BP 138/84 | HR 106 | Ht 63.0 in | Wt 200.6 lb

## 2022-07-10 DIAGNOSIS — I10 Essential (primary) hypertension: Secondary | ICD-10-CM | POA: Diagnosis not present

## 2022-07-10 DIAGNOSIS — J45909 Unspecified asthma, uncomplicated: Secondary | ICD-10-CM | POA: Insufficient documentation

## 2022-07-10 DIAGNOSIS — R0683 Snoring: Secondary | ICD-10-CM

## 2022-07-10 DIAGNOSIS — Z30011 Encounter for initial prescription of contraceptive pills: Secondary | ICD-10-CM | POA: Diagnosis not present

## 2022-07-10 MED ORDER — AMLODIPINE BESYLATE 5 MG PO TABS: 5.0000 mg | ORAL_TABLET | Freq: Every day | ORAL | 2 refills | Status: DC

## 2022-07-10 MED ORDER — NORETHINDRONE 0.35 MG PO TABS: 1.0000 | ORAL_TABLET | Freq: Every day | ORAL | 11 refills | Status: DC

## 2022-07-10 NOTE — Progress Notes (Signed)
Subjective:    Patient ID: Barbara Marshall, female    DOB: 08/19/1986, 36 y.o.   MRN: [id number]  HPI  36 year old female patient with history of HIV infection, pregnancy-induced hypertension presents to the clinic today with complaints of uncontrolled blood pressure.  Patient was initially seen by infectious disease provider who was trying to manage her blood pressure but has requested that patient be seen by PCP.  Patient states that she has trialed lisinopril in July 2023 which caused her cough.  Patient states that she is also trialed hydrochlorothiazide in June 2022 which caused lightheadedness.  Patient currently on valsartan 160 mg.  Patient states that she has been on valsartan 160 mg for approximately 1 month.  Patient was also started on amlodipine a few months but it was discontinued as it was not helping her blood pressure.  Patient states she has not been on more than one hypertensive agent at a time.  Patient states that her blood pressures recently have ranged from 200 to 150 over 89 to 100.  Patient states that she admits to having occasional headaches and swelling to bilateral ankles and feet when her blood pressure is really elevated.  Patient denies any chest pain, shortness of breath, difficulty breathing, changes to her vision.  Patient states that she first started having issues with blood pressure after having her twins in 2019.  Patient currently taking combined oral contraceptive (Junel) for birth control.  Patient also states that she is chronically fatigued and her partner has complained that she snores loudly at night.     Review of Systems  Neurological:  Positive for headaches.  All other systems reviewed and are negative.      Objective:   Physical Exam Vitals reviewed.  Constitutional:      General: She is not in acute distress.    Appearance: Normal appearance. She is normal weight. She is not ill-appearing, toxic-appearing or diaphoretic.  HENT:      Head: Normocephalic and atraumatic.  Cardiovascular:     Rate and Rhythm: Normal rate and regular rhythm.     Pulses: Normal pulses.     Heart sounds: Normal heart sounds. No murmur heard. Pulmonary:     Effort: Pulmonary effort is normal. No respiratory distress.     Breath sounds: Normal breath sounds. No wheezing.  Musculoskeletal:     Right lower leg: No edema.     Left lower leg: No edema.     Comments: Grossly intact  Skin:    General: Skin is warm.     Capillary Refill: Capillary refill takes less than 2 seconds.  Neurological:     Mental Status: She is alert.     Comments: Grossly intact  Psychiatric:        Mood and Affect: Mood normal.        Behavior: Behavior normal.        Assessment & Plan:   1. Uncontrolled hypertension -Blood pressure today 138/84 and 152/100 on recheck. -Primary hypertension versus secondary causes considered.  We will assess thyroid, parathyroid, sleep apnea, and birth control as secondary causes of high blood pressure. -In the meanwhile we will add amlodipine 5 mg daily. -Discussed in detail signs symptoms of hypotension. -If no improvement with amlodipine 5 mg we will increase amlodipine to 10 mg.  If no improvement with 10 mg of amlodipine will consider adding hydrochlorothiazide.  If no improvement with hydrochlorothiazide will consider referral to hypertension specialist. - Thyroid Panel With TSH -  Parathyroid hormone, intact (no Ca) - amLODipine (NORVASC) 5 MG tablet; Take 1 tablet (5 mg total) by mouth daily.  Dispense: 30 tablet; Refill: 2 - Ambulatory referral to Sleep Studies -Return to clinic in 1 week for blood pressure follow-up.  Bring your blood pressure cuff to ensure accuracy of home blood pressure monitor.  2. Oral contraception initial prescription -We will stop combined oral contraception in the event it is contributing to hypertension or possibly increasing risk of stroke or blood clots -We will start patient on pressure  just and only pill. -Patient started new pack on Monday.  Patient instructed to start progestin only pill tomorrow and use backup birth control method for 7 days. - norethindrone (MICRONOR) 0.35 MG tablet; Take 1 tablet (0.35 mg total) by mouth daily.  Dispense: 28 tablet; Refill: 11 -Follow-up in 1 week for blood pressure check  3. Snoring - Ambulatory referral to Sleep Studies, urgent   Note:  This document was prepared using Dragon voice recognition software and may include unintentional dictation errors. Note - This record has been created using [employer].  Chart creation errors have been sought, but may not always  have been located. Such creation errors do not reflect on  the standard of medical care.

## 2022-07-11 LAB — THYROID PANEL WITH TSH
Free Thyroxine Index: 2.1 (ref 1.2–4.9)
T3 Uptake Ratio: 19 % — ABNORMAL LOW (ref 24–39)
T4, Total: 10.9 ug/dL (ref 4.5–12.0)
TSH: 1.05 u[IU]/mL (ref 0.450–4.500)

## 2022-07-11 LAB — PARATHYROID HORMONE, INTACT (NO CA): PTH: 39 pg/mL (ref 15–65)

## 2022-07-17 ENCOUNTER — Ambulatory Visit (INDEPENDENT_AMBULATORY_CARE_PROVIDER_SITE_OTHER): Payer: BC Managed Care – PPO | Admitting: Nurse Practitioner

## 2022-07-17 VITALS — BP 140/82 | HR 93 | Ht 63.5 in | Wt 201.0 lb

## 2022-07-17 DIAGNOSIS — R7989 Other specified abnormal findings of blood chemistry: Secondary | ICD-10-CM

## 2022-07-17 DIAGNOSIS — I10 Essential (primary) hypertension: Secondary | ICD-10-CM

## 2022-07-17 MED ORDER — VALSARTAN 320 MG PO TABS: 320.0000 mg | ORAL_TABLET | Freq: Every day | ORAL | 3 refills | Status: DC

## 2022-07-17 NOTE — Progress Notes (Signed)
   Subjective:    Patient ID: Barbara Marshall, female    DOB: [birth year], 36 y.o.   MRN: [id number]  HPI  BP follow up.  Patient was seen 2 weeks ago for uncontroled BP. Patient started on Amlopdipine 76m and her Valsartan was increased to 1625mfrom 8031mAlso, patient birth control pill was changed from a COC to POP. Patient also was referred to sleep medicine for sleep apnea referral, her consultation in scheduled for September 13th.  Patient reports that her BP has improved. Patient reports home BP 140 to 160 over 90 to 100 which is an improvement from 200 to 150 over 89 to 100.  Patient brought in her home BP monitor to compare with clinic BP monitor. It appears that home BP measures ~6 points higher than office BP. Patient state that she will invest in new BP monitor.   Patient denies any chest pain, SOB, difficulty breath, of swelling to legs or ankles.    Review of Systems  All other systems reviewed and are negative.      Objective:   Physical Exam Vitals reviewed.  Constitutional:      General: She is not in acute distress.    Appearance: Normal appearance. She is normal weight. She is not ill-appearing, toxic-appearing or diaphoretic.  HENT:     Head: Normocephalic and atraumatic.  Cardiovascular:     Rate and Rhythm: Normal rate and regular rhythm.     Pulses: Normal pulses.     Heart sounds: Normal heart sounds. No murmur heard. Pulmonary:     Effort: Pulmonary effort is normal. No respiratory distress.     Breath sounds: Normal breath sounds. No wheezing.  Musculoskeletal:     Comments: Grossly intact  Skin:    General: Skin is warm.     Capillary Refill: Capillary refill takes less than 2 seconds.  Neurological:     Mental Status: She is alert.     Comments: Grossly intact  Psychiatric:        Mood and Affect: Mood normal.        Behavior: Behavior normal.           Assessment & Plan:   1. Uncontrolled hypertension - BP in office 140/82 today  but remains elevated at home although it has improved since last visit. -Will increase valsartan to 320m39mDiscussed s/s of hypotension and patient is to notify clinic if she experiences lightheadedness or dizziness - valsartan (DIOVAN) 320 MG tablet; Take 1 tablet (320 mg total) by mouth daily.  Dispense: 90 tablet; Refill: 3 - Go to sleep medicine appointment as scheduled - repeat CMP in 7-10 days - CMP14+EGFR - Provide BP log in 2 weeks  - RTC in 6 weeks for follow  2. Abnormal thyroid blood test - Low free T3. TSH, T4 normal - Possibly related to [employer].  - Will plan to repeat TSH panel in 6 weeks - If Free T3 still low will refer to endo.

## 2022-07-18 ENCOUNTER — Encounter: Payer: Self-pay | Admitting: Nurse Practitioner

## 2022-07-28 ENCOUNTER — Encounter: Payer: Self-pay | Admitting: Nurse Practitioner

## 2022-08-20 ENCOUNTER — Encounter: Payer: Self-pay | Admitting: Neurology

## 2022-08-20 ENCOUNTER — Ambulatory Visit (INDEPENDENT_AMBULATORY_CARE_PROVIDER_SITE_OTHER): Payer: BC Managed Care – PPO | Admitting: Neurology

## 2022-08-20 VITALS — BP 137/87 | HR 91 | Ht 64.0 in | Wt 207.0 lb

## 2022-08-20 DIAGNOSIS — R0683 Snoring: Secondary | ICD-10-CM

## 2022-08-20 DIAGNOSIS — G4719 Other hypersomnia: Secondary | ICD-10-CM

## 2022-08-20 DIAGNOSIS — I10 Essential (primary) hypertension: Secondary | ICD-10-CM

## 2022-08-20 DIAGNOSIS — E669 Obesity, unspecified: Secondary | ICD-10-CM

## 2022-08-20 DIAGNOSIS — Z9189 Other specified personal risk factors, not elsewhere classified: Secondary | ICD-10-CM

## 2022-08-20 DIAGNOSIS — G2581 Restless legs syndrome: Secondary | ICD-10-CM

## 2022-08-20 NOTE — Progress Notes (Signed)
Subjective:    Patient ID: Barbara Marshall is a 36 y.o. female.  HPI    Barbara Foley, MD, PhD [facility] [address] [address] [place], [place] [address]  Dear Barbara Marshall,  I saw your patient, Barbara Marshall, upon your kind request in my sleep clinic today for initial consultation of her sleep disorder, in particular, concern for underlying obstructive sleep apnea.  The patient is unaccompanied today.  As you know, Barbara Marshall is a 36 year old right-handed woman with an underlying medical history of hypertension, HIV disease, asthma, irritable bowel syndrome, anxiety and obesity, who reports snoring and excessive daytime somnolence.  I reviewed your office note from 07/10/2022.  Her Epworth sleepiness score is 10 out of 24, fatigue severity score is 22 out of 63.  Her snoring is disturbing to her husband.  She lives with her family including husband, 62 year old stepdaughter and 22-year-old twin boys.  She works in a bank.  She generally goes to bed around 10:30 PM and rise time is around 7:15 AM.  She has a history of restless legs and has been using gabapentin as needed.  She is a non-smoker and drinks alcohol very occasionally.  She drinks caffeine in the form of soda, maybe 1 small bottle per day, decaf tea.  She has no night to night nocturia but has had occasional morning headaches.  She has had some difficulty falling asleep and tried trazodone which she continues to take at 50 mg daily but it does not really seem to help.  They have 1 cat in the household.  Her Past Medical History Is Significant For: Past Medical History:  Diagnosis Date   Anxiety    Asthma    Dysmenorrhea    H/O   Heart murmur    History of IBS    HIV infection (HCC)    HSV infection    Plantar fasciitis    Pregnancy induced hypertension    Trichomonas infection     Her Past Surgical History Is Significant For: Past Surgical History:  Procedure Laterality Date   WISDOM TOOTH  EXTRACTION      Her Family History Is Significant For: Family History  Problem Relation Age of Onset   Cancer Father        BRAIN   Diabetes Father    Asthma Father    Thyroid disease Mother    Migraines Mother    Allergic rhinitis Mother    Eczema Neg Hx    Urticaria Neg Hx     Her Social History Is Significant For: Social History   Socioeconomic History   Marital status: Married    Spouse name: Not on file   Number of children: Not on file   Years of education: Not on file   Highest education level: Not on file  Occupational History   Not on file  Tobacco Use   Smoking status: Never   Smokeless tobacco: Never  Vaping Use   Vaping Use: Never used  Substance and Sexual Activity   Alcohol use: Yes    Comment: SOCIAL rare once year   Drug use: No   Sexual activity: Yes    Birth control/protection: Condom  Other Topics Concern   Not on file  Social History Narrative   Caffiene 1 dr pepper daily (8-16oz). Tea decaff.      Education:  some college      Work [employer]      Married, 2 Bio and 1  Step Daughter.   Social Determinants of Health   Financial Resource Strain: Not on file  Food Insecurity: Not on file  Transportation Needs: Not on file  Physical Activity: Not on file  Stress: Not on file  Social Connections: Not on file    Her Allergies Are:  Allergies  Allergen Reactions   Citalopram Other (See Comments)    Insomnia and restless legs  :   Her Current Medications Are:  Outpatient Encounter Medications as of 08/20/2022  Medication Sig   abacavir-dolutegravir-lamiVUDine (TRIUMEQ) 600-50-300 MG tablet TAKE 1 TABLET BY MOUTH EVERY DAY   amLODipine (NORVASC) 5 MG tablet Take 1 tablet (5 mg total) by mouth daily.   cetirizine (ZYRTEC) 10 MG tablet Take 1 tablet (10 mg total) by mouth daily.   fluticasone (FLONASE) 50 MCG/ACT nasal spray Place 2 sprays into both nostrils daily.   gabapentin (NEURONTIN) 300 MG capsule TAKE 1 TO 2 TABLETS BY  MOUTH AT BEDTIME   norethindrone (MICRONOR) 0.35 MG tablet Take 1 tablet (0.35 mg total) by mouth daily.   PROAIR HFA 108 (90 Base) MCG/ACT inhaler TAKE 2 PUFFS BY MOUTH EVERY 6 HOURS AS NEEDED FOR WHEEZE OR SHORTNESS OF BREATH   traZODone (DESYREL) 50 MG tablet Take 50 mg by mouth at bedtime.   valsartan (DIOVAN) 320 MG tablet Take 1 tablet (320 mg total) by mouth daily.   [DISCONTINUED] promethazine-dextromethorphan (PROMETHAZINE-DM) 6.25-15 MG/5ML syrup Take 5 mLs by mouth 4 (four) times daily as needed for cough.   No facility-administered encounter medications on file as of 08/20/2022.  :   Review of Systems:  Out of a complete 14 point review of systems, all are reviewed and negative with the exception of these symptoms as listed below:  Review of Systems  Neurological:        Snoring, uncontrolled hypertension.  Has trouble falling asleep. ESS 10, FSS  22.    Objective:  Neurological Exam  Physical Exam Physical Examination:   Vitals:   08/20/22 0921  BP: 137/87  Pulse: 91    General Examination: The patient is a very pleasant 36 y.o. female in no acute distress. She appears well-developed and well-nourished and well groomed.   HEENT: Normocephalic, atraumatic, pupils are equal, round and reactive to light, extraocular tracking is good without limitation to gaze excursion or nystagmus noted. Hearing is grossly intact. Face is symmetric with normal facial animation. Speech is clear with no dysarthria noted. There is no hypophonia. There is no lip, neck/head, jaw or voice tremor. Neck is supple with full range of passive and active motion. There are no carotid bruits on auscultation. Oropharynx exam reveals: mild mouth dryness, good dental hygiene and mild airway crowding, due to tonsillar size of about 2+ on the right and 1+ on the left, Mallampati class II, neck circumference of 15-1/2 inches.  She has a moderate overbite and slightly skewed teeth alignment.  Tongue protrudes  centrally and palate elevates symmetrically.  Chest: Clear to auscultation without wheezing, rhonchi or crackles noted.  Heart: S1+S2+0, regular and normal without murmurs, rubs or gallops noted.   Abdomen: Soft, non-tender and non-distended.  Extremities: There is no pitting edema in the distal lower extremities bilaterally.   Skin: Warm and dry without trophic changes noted.   Musculoskeletal: exam reveals no obvious joint deformities, tenderness or joint swelling or erythema.   Neurologically:  Mental status: The patient is awake, alert and oriented in all 4 spheres. Her immediate and remote memory, attention, language skills and  fund of knowledge are appropriate. There is no evidence of aphasia, agnosia, apraxia or anomia. Speech is clear with normal prosody and enunciation. Thought process is linear. Mood is normal and affect is normal.  Cranial nerves II - XII are as described above under HEENT exam.  Motor exam: Normal bulk, strength and tone is noted. There is no obvious tremor.  Fine motor skills and coordination: grossly intact.  Cerebellar testing: No dysmetria or intention tremor. There is no truncal or gait ataxia.  Sensory exam: intact to light touch in the upper and lower extremities.  Gait, station and balance: She stands easily. No veering to one side is noted. No leaning to one side is noted. Posture is age-appropriate and stance is narrow based. Gait shows normal stride length and normal pace. No problems turning are noted.  Assessment and Plan:  In summary, SCOTTIE KRISTON is a very pleasant 36 y.o.-year old female with an underlying medical history of hypertension, HIV disease, asthma, irritable bowel syndrome, anxiety and obesity, whose history and physical exam are concerning for sleep disordered breathing, supporting a current working diagnosis of unspecified sleep apnea, with the main differential diagnoses of obstructive sleep apnea (OSA) versus upper airway  resistance syndrome (UARS) versus central sleep apnea (CSA), or mixed sleep apnea. A laboratory attended sleep study is considered gold standard for evaluation of sleep disordered breathing and is recommended at this time and clinically justified.   I had a long chat with the patient about my findings and the diagnosis of sleep apnea, particularly OSA, its prognosis and treatment options. We talked about medical/conservative treatments, surgical interventions and non-pharmacological approaches for symptom control. I explained, in particular, the risks and ramifications of untreated moderate to severe OSA, especially with respect to developing cardiovascular disease down the road, including congestive heart failure (CHF), difficult to treat hypertension, cardiac arrhythmias (particularly A-fib), neurovascular complications including TIA, stroke and dementia. Even type 2 diabetes has, in part, been linked to untreated OSA. Symptoms of untreated OSA may include (but may not be limited to) daytime sleepiness, nocturia (i.e. frequent nighttime urination), memory problems, mood irritability and suboptimally controlled or worsening mood disorder such as depression and/or anxiety, lack of energy, lack of motivation, physical discomfort, as well as recurrent headaches, especially morning or nocturnal headaches. We talked about the importance of maintaining a healthy lifestyle and striving for healthy weight. In addition, we talked about the importance of striving for and maintaining good sleep hygiene. I recommended the following at this time: sleep study.  I outlined the differences between a laboratory attended sleep study which is considered more comprehensive and accurate over the option of a home sleep test (HST); the latter may lead to underestimation of sleep disordered breathing in some instances and does not help with diagnosing upper airway resistance syndrome and is not accurate enough to diagnose primary  central sleep apnea typically. I explained the different sleep test procedures to the patient in detail and also outlined possible surgical and non-surgical treatment options of OSA, including the use of a pressure airway pressure (PAP) device (ie CPAP, AutoPAP/APAP or BiPAP in certain circumstances), a custom-made dental device (aka oral appliance, which would require a referral to a specialist dentist or orthodontist typically, and is generally speaking not considered a good choice for patients with full dentures or edentulous state), upper airway surgical options, such as traditional UPPP (which is not considered a first-line treatment) or the Inspire device (hypoglossal nerve stimulator, which would involve a referral for  consultation with an ENT surgeon, after careful selection, following inclusion criteria). I explained the PAP treatment option to the patient in detail, as this is generally considered first-line treatment.  The patient indicated that she would be willing to try PAP therapy, if the need arises. I explained the importance of being compliant with PAP treatment, not only for insurance purposes but primarily to improve patient's symptoms symptoms, and for the patient's long term health benefit, including to reduce Her cardiovascular risks longer-term.    We will pick up our discussion about the next steps and treatment options after testing.  We will keep her posted as to the test results by phone call and/or MyChart messaging where possible.  We will plan to follow-up in sleep clinic accordingly as well.  I answered all her questions today and the patient was in agreement.   I encouraged her to call with any interim questions, concerns, problems or updates or email [place] through [facility].  Generally speaking, sleep test authorizations may take up to 2 weeks, sometimes less, sometimes longer, the patient is encouraged to get in touch with [place] if they do not hear back from the sleep lab staff  directly within the next 2 weeks.  Thank you very much for allowing me to participate in the care of this nice patient. If I can be of any further assistance to you please do not hesitate to call me at [phone].  Sincerely,   Star Age, MD, PhD

## 2022-08-20 NOTE — Patient Instructions (Signed)

## 2022-08-21 LAB — CMP14+EGFR
ALT: 27 IU/L (ref 0–32)
AST: 16 IU/L (ref 0–40)
Albumin/Globulin Ratio: 1.4 (ref 1.2–2.2)
Albumin: 4.2 g/dL (ref 3.9–4.9)
Alkaline Phosphatase: 102 IU/L (ref 44–121)
BUN/Creatinine Ratio: 10 (ref 9–23)
BUN: 8 mg/dL (ref 6–20)
Bilirubin Total: 0.3 mg/dL (ref 0.0–1.2)
CO2: 21 mmol/L (ref 20–29)
Calcium: 9.1 mg/dL (ref 8.7–10.2)
Chloride: 106 mmol/L (ref 96–106)
Creatinine, Ser: 0.78 mg/dL (ref 0.57–1.00)
Globulin, Total: 3.1 g/dL (ref 1.5–4.5)
Glucose: 101 mg/dL — ABNORMAL HIGH (ref 70–99)
Potassium: 4.3 mmol/L (ref 3.5–5.2)
Sodium: 142 mmol/L (ref 134–144)
Total Protein: 7.3 g/dL (ref 6.0–8.5)
eGFR: 102 mL/min/{1.73_m2} (ref >59)

## 2022-08-25 ENCOUNTER — Encounter: Payer: Self-pay | Admitting: Nurse Practitioner

## 2022-08-28 ENCOUNTER — Other Ambulatory Visit: Payer: Self-pay | Admitting: Family Medicine

## 2022-08-28 ENCOUNTER — Ambulatory Visit (INDEPENDENT_AMBULATORY_CARE_PROVIDER_SITE_OTHER): Payer: BC Managed Care – PPO | Admitting: Nurse Practitioner

## 2022-08-28 VITALS — BP 112/64 | HR 84 | Temp 98.1°F

## 2022-08-28 DIAGNOSIS — I10 Essential (primary) hypertension: Secondary | ICD-10-CM | POA: Diagnosis not present

## 2022-08-28 DIAGNOSIS — Z30011 Encounter for initial prescription of contraceptive pills: Secondary | ICD-10-CM | POA: Diagnosis not present

## 2022-08-28 DIAGNOSIS — R7989 Other specified abnormal findings of blood chemistry: Secondary | ICD-10-CM

## 2022-08-28 MED ORDER — AMLODIPINE BESYLATE 5 MG PO TABS: 5.0000 mg | ORAL_TABLET | Freq: Every day | ORAL | 1 refills | Status: DC

## 2022-08-28 MED ORDER — ESTRADIOL-NORETHINDRONE ACET 0.5-0.1 MG PO TABS: 1.0000 | ORAL_TABLET | Freq: Every day | ORAL | 11 refills | Status: DC

## 2022-08-28 NOTE — Progress Notes (Signed)
   Subjective:    Patient ID: Barbara Marshall, female    DOB: 07-29-1986, 36 y.o.   MRN: [id number]  HPI  6 week follow up for hypertension. Patient states that BP is doing much better. Average BP 130/80's. See recent bp readings sent via mychart  Patient denies any headaches, dizziness, lightheadedness, chest pain, difficulty breathing, sob.  Patient currently using Micronor for birth control. Patient states that she would like to switch her birth control pills to a birth control pill with estrogen because of increased spotting. Patient's birth control was stopped due to elevate blood pressures.   Review of Systems  All other systems reviewed and are negative.      Objective:   Physical Exam Vitals reviewed.  Constitutional:      General: She is not in acute distress.    Appearance: Normal appearance. She is normal weight. She is not ill-appearing, toxic-appearing or diaphoretic.  HENT:     Head: Normocephalic and atraumatic.  Cardiovascular:     Rate and Rhythm: Normal rate and regular rhythm.     Pulses: Normal pulses.     Heart sounds: Normal heart sounds. No murmur heard. Pulmonary:     Effort: Pulmonary effort is normal. No respiratory distress.     Breath sounds: Normal breath sounds. No wheezing.  Musculoskeletal:     Comments: Grossly intact  Skin:    General: Skin is warm.     Capillary Refill: Capillary refill takes less than 2 seconds.  Neurological:     Mental Status: She is alert.     Comments: Grossly intact  Psychiatric:        Mood and Affect: Mood normal.        Behavior: Behavior normal.           Assessment & Plan:   1. Uncontrolled hypertension - Continue with Valsartan 320mg  and Amlodipine 5mg  as prescribed. - Refill: amLODipine (NORVASC) 5 MG tablet; Take 1 tablet (5 mg total) by mouth daily.  Dispense: 90 tablet; Refill: 1 - RTC in 3 months for follow up  2. Oral contraception initial prescription - Will add small amount of estrogen  to patient's birth control method to help stabilize the endometrium - Estradiol-Norethindrone Acet 0.5-0.1 MG tablet; Take 1 tablet by mouth daily.  Dispense: 28 tablet; Refill: 11 - If blood pressure increases will stop Combined oral contraception. -Patient anticipates that her husband will have vasectomy in near future -RTC in 3 months  3. Abnormal thyroid blood test - TSH and T4 normal, however, free T3 was mildly abnormal. - Possibly related to birth control use. However will recheck thyroid panel. - Thyroid Panel With TSH -RTC in 3 months

## 2022-08-29 LAB — THYROID PANEL WITH TSH
Free Thyroxine Index: 2 (ref 1.2–4.9)
T3 Uptake Ratio: 19 % — ABNORMAL LOW (ref 24–39)
T4, Total: 10.3 ug/dL (ref 4.5–12.0)
TSH: 1.29 u[IU]/mL (ref 0.450–4.500)

## 2022-08-30 ENCOUNTER — Encounter: Payer: Self-pay | Admitting: Nurse Practitioner

## 2022-09-09 ENCOUNTER — Encounter: Payer: Self-pay | Admitting: Nurse Practitioner

## 2022-09-10 ENCOUNTER — Other Ambulatory Visit: Payer: Self-pay | Admitting: Nurse Practitioner

## 2022-09-10 DIAGNOSIS — R7989 Other specified abnormal findings of blood chemistry: Secondary | ICD-10-CM

## 2022-09-25 ENCOUNTER — Telehealth: Payer: Self-pay | Admitting: Neurology

## 2022-09-25 NOTE — Telephone Encounter (Signed)
[employer] pending faxed notes.  

## 2022-09-30 NOTE — Telephone Encounter (Signed)
09/25/22 [employer] no auth req via fax form EE   left VM to schedule 09/29/22 [place]

## 2022-10-07 NOTE — Telephone Encounter (Signed)
LVM for pt to call back to schedule.

## 2022-10-08 ENCOUNTER — Encounter: Payer: Self-pay | Admitting: Nurse Practitioner

## 2022-10-08 ENCOUNTER — Other Ambulatory Visit: Payer: Self-pay | Admitting: Nurse Practitioner

## 2022-10-08 DIAGNOSIS — M25531 Pain in right wrist: Secondary | ICD-10-CM

## 2022-10-08 NOTE — Telephone Encounter (Signed)
Patient returned my call.  [employer]- [employer] no auth req via fax form   She is scheduled at [facility] for 10/17/22 at 9 pm.  Mailed packet to the patient.

## 2022-10-13 ENCOUNTER — Telehealth: Payer: Self-pay

## 2022-10-13 NOTE — Telephone Encounter (Signed)
Patient made aware per drs notes and recommendations.  I would mask at work. If she is fever free and feeling okay, she can go to work.

## 2022-10-13 NOTE — Telephone Encounter (Signed)
Patient calls and states she has 2 children with RSV right now, and she has a scratchy throat and slight cough, she needs to know about recommendations for herself about going to work if any restrictions are needed.

## 2022-10-15 ENCOUNTER — Ambulatory Visit (INDEPENDENT_AMBULATORY_CARE_PROVIDER_SITE_OTHER): Payer: BC Managed Care – PPO | Admitting: Family Medicine

## 2022-10-15 VITALS — BP 120/60 | HR 102 | Temp 98.4°F | Ht 64.0 in | Wt 205.0 lb

## 2022-10-15 DIAGNOSIS — J988 Other specified respiratory disorders: Secondary | ICD-10-CM | POA: Diagnosis not present

## 2022-10-15 MED ORDER — IPRATROPIUM BROMIDE 0.06 % NA SOLN: 2.0000 | Freq: Four times a day (QID) | NASAL | 0 refills | Status: DC | PRN

## 2022-10-15 NOTE — Progress Notes (Signed)
Subjective:  Patient ID: Barbara Marshall, female    DOB: March 28, 1986  Age: 36 y.o. MRN: [id number]  CC: Chief Complaint  Patient presents with   runny nose, sinus pressure    Sons had RSV this week    Cough    HPI:  36 year old female with HTN, Asthma, HIV presents for evaluation of the above.  Patient reports that her children (age 27) were recently diagnosed with RSV. She reports that she started having symptoms on Monday. Reports runny nose, congestion, and cough. She reports temp of 100. No documented fever (>100.4). She is most bother by constant runny nose. She has take Robitussin and her normal allergy medication without relief.  Patient Active Problem List   Diagnosis Date Noted   Respiratory infection 10/15/2022   Insomnia secondary to depression with anxiety 04/29/2022   Hypertension 03/24/2022   Plantar fasciitis, bilateral 10/29/2021   Anxiety and depression 10/21/2019   Genital herpes simplex 12/14/2018   Body mass index (BMI) of 25.0 to 29.9 12/14/2018   Seasonal and perennial allergic rhinitis 09/14/2018   Mild intermittent asthma without complication 09/14/2018   Human immunodeficiency virus (HIV) disease (HCC) 11/29/2016    Social Hx   Social History   Socioeconomic History   Marital status: Married    Spouse name: Not on file   Number of children: Not on file   Years of education: Not on file   Highest education level: Not on file  Occupational History   Not on file  Tobacco Use   Smoking status: Never   Smokeless tobacco: Never  Vaping Use   Vaping Use: Never used  Substance and Sexual Activity   Alcohol use: Yes    Comment: SOCIAL rare once year   Drug use: No   Sexual activity: Yes    Birth control/protection: Condom  Other Topics Concern   Not on file  Social History Narrative   Caffiene 1 dr pepper daily (8-16oz). Tea decaff.      Education:  some college      Work [employer]      Married, 2 Bio and 1 Step Daughter.    Social Determinants of Health   Financial Resource Strain: Not on file  Food Insecurity: Not on file  Transportation Needs: Not on file  Physical Activity: Not on file  Stress: Not on file  Social Connections: Not on file    Review of Systems Per HPI  Objective:  BP 120/60   Pulse (!) 102   Temp 98.4 F (36.9 C)   Ht 5\' 4"  (1.626 m)   Wt 205 lb (93 kg)   SpO2 96%   BMI 35.19 kg/m      10/15/2022   11:15 AM 08/28/2022    1:09 PM 08/20/2022    9:21 AM  BP/Weight  Systolic BP 120 112 137  Diastolic BP 60 64 87  Wt. (Lbs) 205  207  BMI 35.19 kg/m2  35.53 kg/m2    Physical Exam Vitals and nursing note reviewed.  Constitutional:      General: She is not in acute distress.    Appearance: Normal appearance.  HENT:     Head: Normocephalic and atraumatic.     Right Ear: Tympanic membrane normal.     Left Ear: Tympanic membrane normal.     Mouth/Throat:     Pharynx: Oropharynx is clear.  Eyes:     General:        Right eye: No discharge.  Left eye: No discharge.     Conjunctiva/sclera: Conjunctivae normal.  Cardiovascular:     Rate and Rhythm: Regular rhythm. Tachycardia present.  Pulmonary:     Effort: Pulmonary effort is normal.     Breath sounds: Normal breath sounds. No wheezing or rales.  Neurological:     Mental Status: She is alert.    Lab Results  Component Value Date   WBC 10.9 (H) 06/12/2021   HGB 13.2 06/12/2021   HCT 38.7 06/12/2021   PLT 276 06/12/2021   GLUCOSE 101 (H) 08/20/2022   CHOL 151 11/28/2016   TRIG 83 11/28/2016   HDL 55 11/28/2016   LDLCALC 79 11/28/2016   ALT 27 08/20/2022   AST 16 08/20/2022   NA 142 08/20/2022   K 4.3 08/20/2022   CL 106 08/20/2022   CREATININE 0.78 08/20/2022   BUN 8 08/20/2022   CO2 21 08/20/2022   TSH 1.290 08/28/2022   HGBA1C 5.0 01/07/2017     Assessment & Plan:   Problem List Items Addressed This Visit       Respiratory   Respiratory infection - Primary    Suspected RSV given  children with RSV at home.  Benign exam.  Advised supportive care. Using Atrovent nasal to help with rhinitis. Work note given.       Meds ordered this encounter  Medications   ipratropium (ATROVENT) 0.06 % nasal spray    Sig: Place 2 sprays into both nostrils 4 (four) times daily as needed for rhinitis.    Dispense:  15 mL    Refill:  0    Follow-up:  Return if symptoms worsen or fail to improve.  Everlene Other DO [facility]

## 2022-10-15 NOTE — Assessment & Plan Note (Signed)
Suspected RSV given children with RSV at home.  Benign exam.  Advised supportive care. Using Atrovent nasal to help with rhinitis. Work note given.

## 2022-10-17 ENCOUNTER — Ambulatory Visit (INDEPENDENT_AMBULATORY_CARE_PROVIDER_SITE_OTHER): Payer: BC Managed Care – PPO | Admitting: Neurology

## 2022-10-17 DIAGNOSIS — Z9189 Other specified personal risk factors, not elsewhere classified: Secondary | ICD-10-CM

## 2022-10-17 DIAGNOSIS — R0683 Snoring: Secondary | ICD-10-CM

## 2022-10-17 DIAGNOSIS — G4719 Other hypersomnia: Secondary | ICD-10-CM

## 2022-10-17 DIAGNOSIS — G4733 Obstructive sleep apnea (adult) (pediatric): Secondary | ICD-10-CM | POA: Diagnosis not present

## 2022-10-17 DIAGNOSIS — G2581 Restless legs syndrome: Secondary | ICD-10-CM

## 2022-10-17 DIAGNOSIS — G4720 Circadian rhythm sleep disorder, unspecified type: Secondary | ICD-10-CM

## 2022-10-17 DIAGNOSIS — E669 Obesity, unspecified: Secondary | ICD-10-CM

## 2022-10-17 DIAGNOSIS — I10 Essential (primary) hypertension: Secondary | ICD-10-CM

## 2022-10-21 ENCOUNTER — Ambulatory Visit (INDEPENDENT_AMBULATORY_CARE_PROVIDER_SITE_OTHER): Payer: BC Managed Care – PPO | Admitting: Nurse Practitioner

## 2022-10-21 ENCOUNTER — Encounter: Payer: Self-pay | Admitting: Nurse Practitioner

## 2022-10-21 VITALS — BP 138/90 | HR 77 | Ht 64.0 in | Wt 207.8 lb

## 2022-10-21 DIAGNOSIS — R7989 Other specified abnormal findings of blood chemistry: Secondary | ICD-10-CM | POA: Diagnosis not present

## 2022-10-21 DIAGNOSIS — Z8349 Family history of other endocrine, nutritional and metabolic diseases: Secondary | ICD-10-CM

## 2022-10-21 NOTE — Progress Notes (Signed)
Endocrinology Consult Note                                         10/21/2022, 11:16 AM  Subjective:   Subjective    Barbara Marshall is a 36 y.o.-year-old female patient being seen in consultation for abnormal thyroid function tests referred by Coral Spikes, DO.   Past Medical History:  Diagnosis Date   Anxiety    Asthma    Dysmenorrhea    H/O   Heart murmur    History of IBS    HIV infection ([place])    HSV infection    Plantar fasciitis    Pregnancy induced hypertension    Trichomonas infection     Past Surgical History:  Procedure Laterality Date   WISDOM TOOTH EXTRACTION      Social History   Socioeconomic History   Marital status: Married    Spouse name: Not on file   Number of children: Not on file   Years of education: Not on file   Highest education level: Not on file  Occupational History   Not on file  Tobacco Use   Smoking status: Never   Smokeless tobacco: Never  Vaping Use   Vaping Use: Never used  Substance and Sexual Activity   Alcohol use: Yes    Comment: SOCIAL rare once year   Drug use: No   Sexual activity: Yes    Birth control/protection: Condom  Other Topics Concern   Not on file  Social History Narrative   Caffiene 1 dr pepper daily (8-16oz). Tea decaff.      Education:  some college      Work [employer]      Married, 2 Bio and 1 Step Daughter.   Social Determinants of Health   Financial Resource Strain: Not on file  Food Insecurity: Not on file  Transportation Needs: Not on file  Physical Activity: Not on file  Stress: Not on file  Social Connections: Not on file    Family History  Problem Relation Age of Onset   Cancer Father        BRAIN   Diabetes Father    Asthma Father    Thyroid disease Mother    Migraines Mother    Allergic rhinitis Mother    Eczema Neg Hx    Urticaria Neg Hx     Outpatient Encounter Medications as of 10/21/2022   Medication Sig   abacavir-dolutegravir-lamiVUDine (TRIUMEQ) 600-50-300 MG tablet TAKE 1 TABLET BY MOUTH EVERY DAY   albuterol (VENTOLIN HFA) 108 (90 Base) MCG/ACT inhaler Inhale 1 puff into the lungs as needed.   amLODipine (NORVASC) 5 MG tablet Take 1 tablet (5 mg total) by mouth daily.   cetirizine (ZYRTEC) 10 MG tablet Take 1 tablet (10 mg total) by mouth daily.   Estradiol-Norethindrone Acet 0.5-0.1 MG tablet Take 1 tablet by mouth daily.   fluticasone (FLONASE) 50 MCG/ACT nasal spray Place 2 sprays into both nostrils daily.   gabapentin (NEURONTIN)  300 MG capsule TAKE 1 TO 2 TABLETS BY MOUTH AT BEDTIME   ipratropium (ATROVENT) 0.06 % nasal spray Place 2 sprays into both nostrils 4 (four) times daily as needed for rhinitis.   PROAIR HFA 108 (90 Base) MCG/ACT inhaler TAKE 2 PUFFS BY MOUTH EVERY 6 HOURS AS NEEDED FOR WHEEZE OR SHORTNESS OF BREATH   traZODone (DESYREL) 50 MG tablet Take 50 mg by mouth at bedtime.   valsartan (DIOVAN) 320 MG tablet Take 1 tablet (320 mg total) by mouth daily.   No facility-administered encounter medications on file as of 10/21/2022.    ALLERGIES: Allergies  Allergen Reactions   Citalopram Other (See Comments)    Insomnia and restless legs   VACCINATION STATUS: Immunization History  Administered Date(s) Administered   Influenza,inj,Quad PF,6+ Mos 09/29/2018, 10/21/2019   MMR 07/28/2018   Moderna Sars-Covid-2 Vaccination 08/17/2020, 09/28/2020   Tdap 07/28/2018     HPI   Barbara Marshall is a patient with the above medical history.  She was seen in her PCP office back in August for problems managing her high blood pressure.  During evaluation, her thyroid labs were checked and showed abnormal T3 levels which was rechecked showing similar results, prompting her referral here to our office today.  I reviewed patient's thyroid tests:  Lab Results  Component Value Date   TSH 1.290 08/28/2022   TSH 1.050 07/10/2022   TSH 1.210 11/28/2016      Pt describes: - weight gain - insomnia - heat intolerance - anxiety - moodiness - fatigue - hair loss  Pt denies feeling nodules in neck, hoarseness, dysphagia/odynophagia, SOB with lying down.  she has strong family history of thyroid disorders in her mom, paternal aunts, and several female cousins.  No family history of thyroid cancer.  No history of radiation therapy to head or neck.  No recent use of iodine supplements.  Denies use of Biotin containing supplements.  I reviewed her chart and she also has a history of HIV on antivirals, vitamin d deficiency, and on oral birth control pills.   ROS:  Constitutional: + weight gain, + fatigue, + subjective hyperthermia, no subjective hypothermia Eyes: no blurry vision, no xerophthalmia ENT: no sore throat, no nodules palpated in throat, no dysphagia/odynophagia, no hoarseness Cardiovascular: no chest pain, no SOB, no palpitations, no leg swelling Respiratory: no cough, no SOB Gastrointestinal: no nausea/vomiting/diarrhea Musculoskeletal: no muscle/joint aches Skin: no rashes, + hair loss Neurological: no tremors, no numbness, no tingling, no dizziness Psychiatric: no depression, + anxiety, + mood swings, + insomnia   Objective:   Objective     BP (!) 138/90 (BP Location: Left Arm, Patient Position: Sitting, Cuff Size: Normal)   Pulse 77   Ht _0  (1.626 m)   Wt 207 lb 12.8 oz (94.3 kg)   BMI 35.67 kg/m  Wt Readings from Last 3 Encounters:  10/21/22 207 lb 12.8 oz (94.3 kg)  10/15/22 205 lb (93 kg)  08/20/22 207 lb (93.9 kg)    BP Readings from Last 3 Encounters:  10/21/22 (!) 138/90  10/15/22 120/60  08/28/22 112/64     Constitutional:  Body mass index is 35.67 kg/m., not in acute distress, normal state of mind Eyes: PERRLA, EOMI, no exophthalmos ENT: moist mucous membranes, no thyromegaly, no cervical lymphadenopathy Cardiovascular: normal precordial activity, RRR, no murmur/rubs/gallops Respiratory:   adequate breathing efforts, no gross chest deformity, Clear to auscultation bilaterally Gastrointestinal: abdomen soft, non-tender, no distension, bowel sounds present Musculoskeletal: no gross deformities, strength  intact in all four extremities Skin: moist, warm, no rashes Neurological: slight tremor with outstretched hands, deep tendon reflexes normal in BLE.   CMP ( most recent) CMP     Component Value Date/Time   NA 142 08/20/2022 1043   K 4.3 08/20/2022 1043   CL 106 08/20/2022 1043   CO2 21 08/20/2022 1043   GLUCOSE 101 (H) 08/20/2022 1043   GLUCOSE 113 (H) 06/12/2021 1124   BUN 8 08/20/2022 1043   CREATININE 0.78 08/20/2022 1043   CALCIUM 9.1 08/20/2022 1043   PROT 7.3 08/20/2022 1043   ALBUMIN 4.2 08/20/2022 1043   AST 16 08/20/2022 1043   ALT 27 08/20/2022 1043   ALKPHOS 102 08/20/2022 1043   BILITOT 0.3 08/20/2022 1043   GFRNONAA >60 06/12/2021 1124   GFRAA >60 07/27/2018 0543     Diabetic Labs (most recent): Lab Results  Component Value Date   HGBA1C 5.0 01/07/2017     Lipid Panel ( most recent) Lipid Panel     Component Value Date/Time   CHOL 151 11/28/2016 0936   TRIG 83 11/28/2016 0936   HDL 55 11/28/2016 0936   CHOLHDL 2.7 11/28/2016 0936   LDLCALC 79 11/28/2016 0936   LABVLDL 17 11/28/2016 0936       Lab Results  Component Value Date   TSH 1.290 08/28/2022   TSH 1.050 07/10/2022   TSH 1.210 11/28/2016      Assessment & Plan:   ASSESSMENT / PLAN:  1. Abnormal thyroid function tests 2. Family history of thyroid dysfunction  On physical exam, patient does not have gross goiter, thyroid nodules, or neck compression symptoms.  - Will check thyroid tests today: TSH, free T4, and antibody testing to assess for autoimmune thyroid dysfunction  -Will also order thyroid ultrasound to assess baseline thyroid anatomy.    - Time spent with the patient: 45 minutes, of which >50% was spent in obtaining information about her symptoms,  reviewing her previous labs, evaluations, and treatments, counseling her about her hypothyroidism, and developing a plan to confirm the diagnosis and long term treatment as necessary. Please refer to "Patient Self Inventory" in the Media tab for reviewed elements of pertinent patient history.  Meredith Staggers participated in the discussions, expressed understanding, and voiced agreement with the above plans.  All questions were answered to her satisfaction. she is encouraged to contact clinic should she have any questions or concerns prior to her return visit.   FOLLOW UP PLAN:  Return in about 4 weeks (around 11/18/2022) for Thyroid follow up, Previsit labs, thyroid ultrasound.  Rayetta Pigg, [facility] [address] [place], [place] [address] Phone: [phone] Fax: [phone]  10/21/2022, 11:16 AM

## 2022-10-21 NOTE — Patient Instructions (Signed)
Triiodothyronine Test Why am I having this test? The triiodothyronine test is used to evaluate thyroid function. The thyroid is a gland in the lower front of your neck. It makes hormones that affect many body parts and systems, including the system that affects how quickly your body burns fuel for energy (metabolism). You may have this test: To monitor treatment for a thyroid disorder. To help diagnose various thyroid conditions, including: An overactive thyroid (hyperthyroidism). An underactive thyroid (hypothyroidism). Inflammation of the thyroid (thyroiditis). In some cases, children may have this test done to determine the severity of the hypothyroidism. What is being tested? This test measures the level of triiodothyronine (T3) in the blood. T3 is one of the two main hormones made by the thyroid gland. This test is often done with thyroid tests that test other thyroid hormones. What kind of sample is taken?  A blood sample is required for this test. It is usually collected by inserting a needle into a blood vessel. How do I prepare for this test? Follow instructions from your health care provider about changing or stopping your regular medicines or supplements. Many medicines and supplements can affect thyroid hormones, including birth control pills, estrogen, aspirin, and biotin. Tell a health care provider about: Any allergies you have. All medicines you are taking, including vitamins, herbs, eye drops, creams, and over-the-counter medicines. Any bleeding problems you have. Any surgeries you have had. Any medical conditions you have. Whether you are pregnant or may be pregnant. How are the results reported? Your test results will be reported as a value that indicates how much T3 is in your blood. This will be given as nanograms of T3 per deciliter of blood (ng/dL). Your health care provider will compare your results to normal ranges that were established after testing a large group of  people (reference ranges). Reference ranges may vary among labs and hospitals. For this test, common reference ranges are: Reference ranges for T3 vary by age. Common reference ranges are: 32-36 days old: 100-740 ng/dL. 1-11 months old: 105-245 ng/dL. 39-70 years old: 105-270 ng/dL. 80-104 years old: 95-240 ng/dL. 55-37 years old: 80-215 ng/dL. 27-70 years old: 80-210 ng/dL. 68-85 years old: 70-205 ng/dL. Older than 50 years: 40-180 ng/dL. What do the results mean? A result that is within your reference range is considered normal. This means that you have a normal amount of T3 in your blood. A result that is higher than your reference range means that you have more T3 in your blood than normal. This may mean that: You have hyperthyroidism. You have thyroiditis. You are taking more thyroid medicine than you need. You are pregnant. You have hepatitis. A result that is lower than your reference range means that you have less T3 in your blood than normal. This may mean that you have: Hypothyroidism. Problems with your pituitary gland function. Problems with your hypothalamus gland function. A lack of certain nutrients in your diet (malnutrition). Liver disease. Kidney disease. Talk with your health care provider about what your results mean. Questions to ask your health care provider Ask your health care provider, or the department that is doing the test: When will my results be ready? How will I get my results? What are my treatment options? What other tests do I need? What are my next steps? Summary The thyroid is a gland in the lower front of the neck. It makes hormones that affect many body parts and systems, including the system that affects how quickly your body burns  fuel for energy (metabolism). The triiodothyronine test is used to evaluate thyroid function. It measures the level of triiodothyronine (T3) in your blood. T3 is one of the two main hormones made by the thyroid  gland. Follow instructions from your health care provider about changing or stopping your regular medicines. Many medicines can affect thyroid hormones, including birth control pills, estrogen, and aspirin. Talk with your health care provider about what your test results mean. This information is not intended to replace advice given to you by your health care provider. Make sure you discuss any questions you have with your health care provider. Document Revised: 11/26/2021 Document Reviewed: 11/26/2021 Elsevier Patient Education  2023 [employer].

## 2022-10-22 ENCOUNTER — Encounter: Payer: Self-pay | Admitting: Orthopaedic Surgery

## 2022-10-22 ENCOUNTER — Ambulatory Visit (INDEPENDENT_AMBULATORY_CARE_PROVIDER_SITE_OTHER): Payer: BC Managed Care – PPO | Admitting: Orthopaedic Surgery

## 2022-10-22 ENCOUNTER — Ambulatory Visit (INDEPENDENT_AMBULATORY_CARE_PROVIDER_SITE_OTHER): Payer: BC Managed Care – PPO

## 2022-10-22 ENCOUNTER — Telehealth: Payer: Self-pay | Admitting: *Deleted

## 2022-10-22 VITALS — BP 125/83 | HR 115 | Ht 64.0 in | Wt 209.0 lb

## 2022-10-22 DIAGNOSIS — G8929 Other chronic pain: Secondary | ICD-10-CM

## 2022-10-22 DIAGNOSIS — M25532 Pain in left wrist: Secondary | ICD-10-CM

## 2022-10-22 DIAGNOSIS — M25531 Pain in right wrist: Secondary | ICD-10-CM | POA: Diagnosis not present

## 2022-10-22 MED ORDER — NAPROXEN 500 MG PO TABS: 500.0000 mg | ORAL_TABLET | Freq: Two times a day (BID) | ORAL | 5 refills | Status: DC

## 2022-10-22 NOTE — Patient Instructions (Signed)
As the weather changes and gets cooler, you may notice you are affected more. You may have more pain in your joints. This is normal. Dress warmly and make sure that area is covered well.   Dr.Keeling is here all day on Tuesdays, Wednesday mornings, and Thursday mornings. If you need anything such as a medication refill, please either call BEFORE the end of the day on WEDNESDAY or send a message through mychart. Your pharmacy can send a refill request for you. Calling by the end of the day on WEDNESDAYS allows us time to send Dr.Keeling the request and for him to respond before he leaves on Thursdays.  If Dr. Keeling is out of the office, we may send it to one of the other providers and they may not refill it for the same amount that your original prescription is for.   MY NAME IS  AND I AM DR.KEELING'S ASSISTANT. IF YOU NEED ANYTHING, PLEASE DO NOT HESITATE TO EITHER SEND ME A MESSAGE VIA MYCHART OR CALL THE OFFICE [phone] AND LEAVE A MESSAGE FOR ME. I WILL RESPOND WITHIN 24-48 BUSINESS HOURS.   Aspercreme, Biofreeze, Blue Emu or Voltaren Gel over the counter 2-3 times daily. Rub into area well each use for best results.  

## 2022-10-22 NOTE — Telephone Encounter (Signed)
LVM for patient to call back to discuss sleep study results

## 2022-10-22 NOTE — Telephone Encounter (Signed)
Pt returned call. Please call back when available. 

## 2022-10-22 NOTE — Progress Notes (Signed)
Subjective:    Patient ID: Barbara Marshall, female    DOB: 1986/03/18, 36 y.o.   MRN: [id number]  HPI She has had dorsal pain of both wrists, more on the left, for several months not getting any better.  She has no falls, trauma, no swelling, no redness.  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  She has tried Advil two tablets twice a day with minimal help.  She has seen her family doctor. She has no used ice or heat or rubs.  She has no numbness.  I have reviewed the primary care notes.  She has no other joint pains.  She is right hand dominant.   Review of Systems  Constitutional:  Positive for activity change.  Respiratory:  Positive for shortness of breath.   Musculoskeletal:  Positive for arthralgias.  All other systems reviewed and are negative. For Review of Systems, all other systems reviewed and are negative.  The following is a summary of the past history medically, past history surgically, known current medicines, social history and family history.  This information is gathered electronically by the computer from prior information and documentation.  I review this each visit and have found including this information at this point in the chart is beneficial and informative.   Past Medical History:  Diagnosis Date   Anxiety    Asthma    Dysmenorrhea    H/O   Heart murmur    History of IBS    HIV infection ([facility])    HSV infection    Plantar fasciitis    Pregnancy induced hypertension    Trichomonas infection     Past Surgical History:  Procedure Laterality Date   WISDOM TOOTH EXTRACTION      Current Outpatient Medications on File Prior to Visit  Medication Sig Dispense Refill   abacavir-dolutegravir-lamiVUDine (TRIUMEQ) 600-50-300 MG tablet TAKE 1 TABLET BY MOUTH EVERY DAY     albuterol (VENTOLIN HFA) 108 (90 Base) MCG/ACT inhaler Inhale 1 puff into the lungs as needed.     amLODipine (NORVASC) 5 MG tablet Take 1 tablet (5 mg total) by mouth  daily. 90 tablet 1   cetirizine (ZYRTEC) 10 MG tablet Take 1 tablet (10 mg total) by mouth daily. 30 tablet 0   Estradiol-Norethindrone Acet 0.5-0.1 MG tablet Take 1 tablet by mouth daily. 28 tablet 11   fluticasone (FLONASE) 50 MCG/ACT nasal spray Place 2 sprays into both nostrils daily. 16 g 0   gabapentin (NEURONTIN) 300 MG capsule TAKE 1 TO 2 TABLETS BY MOUTH AT BEDTIME 60 capsule 0   ipratropium (ATROVENT) 0.06 % nasal spray Place 2 sprays into both nostrils 4 (four) times daily as needed for rhinitis. 15 mL 0   PROAIR HFA 108 (90 Base) MCG/ACT inhaler TAKE 2 PUFFS BY MOUTH EVERY 6 HOURS AS NEEDED FOR WHEEZE OR SHORTNESS OF BREATH 8.5 Inhaler 1   traZODone (DESYREL) 50 MG tablet Take 50 mg by mouth at bedtime.     valsartan (DIOVAN) 320 MG tablet Take 1 tablet (320 mg total) by mouth daily. 90 tablet 3   No current facility-administered medications on file prior to visit.    Social History   Socioeconomic History   Marital status: Married    Spouse name: Not on file   Number of children: Not on file   Years of education: Not on file   Highest education level: Not on file  Occupational History   Not on file  Tobacco Use   Smoking status: Never   Smokeless tobacco: Never  Vaping Use   Vaping Use: Never used  Substance and Sexual Activity   Alcohol use: Yes    Comment: SOCIAL rare once year   Drug use: No   Sexual activity: Yes    Birth control/protection: Condom  Other Topics Concern   Not on file  Social History Narrative   Caffiene 1 dr pepper daily (8-16oz). Tea decaff.      Education:  some college      Work [employer]      Married, 2 Bio and 1 Step Daughter.   Social Determinants of Health   Financial Resource Strain: Not on file  Food Insecurity: Not on file  Transportation Needs: Not on file  Physical Activity: Not on file  Stress: Not on file  Social Connections: Not on file  Intimate Partner Violence: Not on file    Family History  Problem  Relation Age of Onset   Cancer Father        BRAIN   Diabetes Father    Asthma Father    Thyroid disease Mother    Migraines Mother    Allergic rhinitis Mother    Eczema Neg Hx    Urticaria Neg Hx     BP 125/83   Pulse (!) 115   Ht 5\' 4"  (1.626 m)   Wt 209 lb (94.8 kg)   BMI 35.87 kg/m   Body mass index is 35.87 kg/m.      Objective:   Physical Exam Vitals and nursing note reviewed. Exam conducted with a chaperone present.  Constitutional:      Appearance: She is well-developed.  HENT:     Head: Normocephalic and atraumatic.  Eyes:     Conjunctiva/sclera: Conjunctivae normal.     Pupils: Pupils are equal, round, and reactive to light.  Cardiovascular:     Rate and Rhythm: Normal rate and regular rhythm.  Pulmonary:     Effort: Pulmonary effort is normal.  Abdominal:     Palpations: Abdomen is soft.  Musculoskeletal:       Hands:     Cervical back: Normal range of motion and neck supple.  Skin:    General: Skin is warm and dry.  Neurological:     Mental Status: She is alert and oriented to person, place, and time.     Cranial Nerves: No cranial nerve deficit.     Motor: No abnormal muscle tone.     Coordination: Coordination normal.     Deep Tendon Reflexes: Reflexes are normal and symmetric. Reflexes normal.  Psychiatric:        Behavior: Behavior normal.        Thought Content: Thought content normal.        Judgment: Judgment normal.      X-rays were done of the left wrist, reported separately.     Assessment & Plan:   Encounter Diagnoses  Name Primary?   Chronic pain of left wrist Yes   Chronic pain of right wrist    I will begin Naprosyn 500 po bid pc.  Use Aspercreme, Biofreeze or Voltaren Gel tid to qid.  Return in two weeks.  Call if any problem.  Precautions discussed.  Electronically Signed Sanjuana Kava, MD 11/15/20238:50 AM

## 2022-10-22 NOTE — Procedures (Signed)
Physician Interpretation:     [facility] POLYSOMNOGRAPHY  INTERPRETATION REPORT   STUDY DATE:  10/17/2022     PATIENT NAME:  Barbara Marshall         DATE OF BIRTH:  10-Jul-1986  PATIENT ID:  [id number]    TYPE OF STUDY:  PSG  READING PHYSICIAN: Huston Foley, MD REFERRED BY: Alvis Lemmings, NP SCORING TECHNICIAN: Domingo Cocking, RPSGT  HISTORY: 36 year old right-handed woman with an underlying medical history of hypertension, HIV disease, asthma, irritable bowel syndrome, anxiety and obesity, who reports snoring and excessive daytime somnolence. Height: 64 in Weight: 207 lb (BMI 35) Neck Size: 16 in  MEDICATIONS: Triumeq, Norvasc, Zyrtec, Flonase, Neurontin, Micronor, Desyrel, Diovan TECHNICAL DESCRIPTION: A registered sleep technologist was in attendance for the duration of the recording.  Data collection, scoring, video monitoring, and reporting were performed in compliance with the AASM Manual for the Scoring of Sleep and Associated Events; (Hypopnea is scored based on the criteria listed in Section VIII D. 1b in the AASM Manual V2.6 using a 4% oxygen desaturation rule or Hypopnea is scored based on the criteria listed in Section VIII D. 1a in the AASM Manual V2.6 using 3% oxygen desaturation and /or arousal rule).   SLEEP CONTINUITY AND SLEEP ARCHITECTURE:  Lights-out was at 21:37: and lights-on at  04:40:, with a total recording time of 7 hours, 2.5 min. Total sleep time ( TST) was 277.5 minutes with a decreased sleep efficiency at 65.7%.   BODY POSITION:  TST was divided  between the following sleep positions: 32.1% supine;  45.0% lateral;  23% prone. Duration of total sleep and percent of total sleep in their respective position is as follows: supine 89 minutes (32%), non-supine 189 minutes (68%); right 72 minutes (26%), left 52 minutes (19%), and prone 63 minutes (23%).  Total supine REM sleep time was 00 minutes (0% of total REM sleep). Sleep latency  was increased at 58.0 minutes.  REM sleep was absent. Of the total sleep time, the percentage of stage N1 sleep was 12.4%, which is increased, stage N2 sleep was 88%, which is markedly increased, stage N3 sleep and REM sleep were absent. Wake after sleep onset (WASO) time accounted for 87 minutes with moderate sleep fragmentation noted.  RESPIRATORY MONITORING:  Based on CMS criteria (using a 4% oxygen desaturation rule for scoring hypopneas), there were 194 apneas (194 obstructive; 0 central; 0 mixed), and 13 hypopneas.  Apnea index was 41.9. Hypopnea index was 2.8. The apnea-hypopnea index was 44.8 overall (62.0 supine, 0 non-supine; 0.0 REM, 0.0 supine REM). There were 0 respiratory effort-related arousals (RERAs).  The RERA index was 0 events/h. Total respiratory disturbance index (RDI) was 44.8 events/h. RDI results showed: supine RDI  62.0 /h; non-supine RDI 36.6 /h; REM RDI 0.0 /h, supine REM RDI 0.0 /h.   Based on AASM criteria (using a 3% oxygen desaturation and /or arousal rule for scoring hypopneas), there were 194 apneas (194 obstructive; 0 central; 0 mixed), and 20 hypopneas. Apnea index was 41.9. Hypopnea index was 4.3. The apnea-hypopnea index was 46.3 overall (63.4 supine, 0 non-supine; 0.0 REM, 0.0 supine REM).  There were 0 respiratory effort-related arousals (RERAs).  The RERA index was 0 events/h. Total respiratory disturbance index (RDI) was 46.3 events/h. RDI results showed: supine RDI  63.4 /h; non-supine RDI 38.2 /h; REM RDI 0.0 /h, supine REM RDI 0.0 /h.  OXIMETRY: Oxyhemoglobin Saturation Nadir during sleep was at  83%) from a mean of 96%.  Of the Total sleep time (TST)   hypoxemia (=<88%) was present for  1.1 minutes, or 0.4% of total sleep time.  LIMB MOVEMENTS: There were 0 periodic limb movements of sleep (0.0/hr), of which 0 (0.0/hr) were associated with an arousal. AROUSAL: There were 171 arousals in total, for an arousal index of 37 arousals/hour.  Of these, 113 were  identified as respiratory-related arousals (24 /h), 0 were PLM-related arousals (0 /h), and 83 were non-specific arousals (18 /h). EEG: Review of the EEG showed no abnormal electrical discharges and symmetrical bihemispheric findings.   EKG: The EKG revealed normal sinus rhythm (NSR). The average heart rate during sleep was 71 bpm. AUDIO/VIDEO REVIEW: The audio and video review did not show any abnormal or unusual behaviors, movements, phonations or vocalizations. The patient took 1 restroom break. Snoring was in the mild to moderate range. POST-STUDY QUESTIONNAIRE: Post study, the patient indicated, that sleep was worse than usual.  IMPRESSION:  1. Severe Obstructive sleep apnea (OSA) 2. Dysfunctions associated with sleep stages or arousal from sleep RECOMMENDATIONS:  1. This study demonstrates severe obstructive sleep apnea, with a total AHI of 46.3/hour, supine AHI of 63.4/hour and O2 nadir of 83%.  The absence of REM sleep during the study likely underestimates her sleep disordered breathing.  The patient qualified for an emergency split due to severe sleep apnea. Initiation of CPAP was attempted at a pressure of 5 cm via small Vitera fullface mask from [employer], however, the patient refused treatment secondary to claustrophobia. Treatment with positive airway pressure in the form of CPAP is highly recommended. This will require a full night titration study to optimize therapy settings, mask fit, monitoring of tolerance/acclimation and monitoring of O2 saturations. Other treatment options may be limited due to the severity of her sleep disordered breathing, and may include (generally speaking) surgical options in selected patients or the use of an oral appliance in certain patients. Concomitant weight loss is recommended. Please note that untreated obstructive sleep apnea may carry additional perioperative morbidity. Patients with significant obstructive sleep apnea should receive perioperative PAP  therapy and the surgeons and particularly the anesthesiologist should be informed of the diagnosis and the severity of the sleep disordered breathing. 2. This study shows sleep fragmentation and abnormal sleep stage percentages; these are nonspecific findings and per se do not signify an intrinsic sleep disorder or a cause for the patient's sleep-related symptoms. Causes include (but are not limited to) the first night effect of the sleep study, circadian rhythm disturbances, medication effect or an underlying mood disorder or medical problem.  3. The patient should be cautioned not to drive, work at heights, or operate dangerous or heavy equipment when tired or sleepy. Review and reiteration of good sleep hygiene measures should be pursued with any patient. 4. The patient will be seen in follow-up in the sleep clinic at [facility] for discussion of the test results, symptom and treatment compliance review, further management strategies, etc. The patient and her referring provider will be notified of the test results.   I certify that I have reviewed the entire raw data recording prior to the issuance of this report in accordance with the Standards of Accreditation of the American Academy of Sleep Medicine (AASM). Huston Foley, MD, PhD [facility]) Diplomat, ABPN (Neurology and Sleep)              Technical Report:   General Information  Name: Niang, [place] BMI: 87.86 Physician: Huston Foley, MD  ID: [id number] Height:  64.0 in Technician: Domingo CockingMatthew Randall, RPSGT  Sex: Unknown Weight: 207.0 lb Record: [id number]  Age: [age] [[birth year]] Date: 10/17/2022    Medical & Medication History    [name] is a 36 year old right-handed woman with an underlying medical history of hypertension, HIV disease, asthma, irritable bowel syndrome, anxiety and obesity, who reports snoring and excessive daytime somnolence. Her Epworth sleepiness score is 10 out of 24, fatigue severity score  is 22 out of 63. Her snoring is disturbing to her husband.  Triumeq, Norvasc, Zyrtec, Flonase, Neurontin, Micronor, Desyrel, Diovan   Sleep Disorder      Comments   Patient arrived for a diagnostic polysomnogram. Procedure explained and all questions answered. Standard paste setup without complications. Patient slept supine, left, prone, and right side. Mild to moderate snoring was heard. Severe respiratory events observed. CPAP was attempted at 5cm with a small Vitera FFM. Patient refused due to claustrophobia. No obvious cardiac arrhythmias noted. No significant PLMS observed. Patient had one restroom visit.    Lights out: 09:37:20 PM Lights on: 04:40:12 AM   Time Total Supine Side Prone Upright  Recording (TRT) 7h 2.34234m 2h 42.6734m 2h 56.334m 1h 24.61234m 0h 0.834m  Sleep (TST) 4h 37.41234m 1h 29.7234m 2h 5.1734m 1h 3.70234m 0h 0.3334m   Latency N1 N2 N3 REM Onset Per. Slp. Eff.  Actual 0h 0.9534m 0h 1.41234m 0h 0.4434m 0h 0.4034m 0h 58.4334m 0h 58.3334m 65.68%   Stg Dur Wake N1 N2 N3 REM  Total 145.0 34.5 243.0 0.0 0.0  Supine 73.0 6.5 82.5 0.0 0.0  Side 51.0 17.5 107.5 0.0 0.0  Prone 21.0 10.5 53.0 0.0 0.0  Upright 0.0 0.0 0.0 0.0 0.0   Stg % Wake N1 N2 N3 REM  Total 34.3 12.4 87.6 0.0 0.0  Supine 17.3 2.3 29.7 0.0 0.0  Side 12.1 6.3 38.7 0.0 0.0  Prone 5.0 3.8 19.1 0.0 0.0  Upright 0.0 0.0 0.0 0.0 0.0     Apnea Summary Sub Supine Side Prone Upright  Total 194 Total 194 88 79 27 0    REM 0 0 0 0 0    NREM 194 88 79 27 0  Obs 194 REM 0 0 0 0 0    NREM 194 88 79 27 0  Mix 0 REM 0 0 0 0 0    NREM 0 0 0 0 0  Cen 0 REM 0 0 0 0 0    NREM 0 0 0 0 0   Rera Summary Sub Supine Side Prone Upright  Total 0 Total 0 0 0 0 0    REM 0 0 0 0 0    NREM 0 0 0 0 0   Hypopnea Summary Sub Supine Side Prone Upright  Total 20 Total 20 6 10 4  0    REM 0 0 0 0 0    NREM 20 6 10 4  0   4% Hypopnea Summary Sub Supine Side Prone Upright  Total (4%) 13 Total 13 4 7 2  0    REM 0 0 0 0 0    NREM 13 4 7 2  0     AHI Total Obs Mix Cen   46.27 Apnea 41.95 41.95 0.00 0.00   Hypopnea 4.32 -- -- --  44.76 Hypopnea (4%) 2.81 -- -- --    Total Supine Side Prone Upright  Position AHI 46.27 63.37 42.72 29.29 0.00  REM AHI 0.00   NREM AHI 46.27   Position RDI 46.27 63.37 42.72 29.29 0.00  REM RDI  0.00   NREM RDI 46.27    4% Hypopnea Total Supine Side Prone Upright  Position AHI (4%) 44.76 62.02 41.28 27.40 0.00  REM AHI (4%) 0.00   NREM AHI (4%) 44.76   Position RDI (4%) 44.76 62.02 41.28 27.40 0.00  REM RDI (4%) 0.00   NREM RDI (4%) 44.76    Desaturation Information Threshold: 2% <100% <90% <80% <70% <60% <50% <40%  Supine 158.0 22.0 0.0 0.0 0.0 0.0 0.0  Side 150.0 6.0 0.0 0.0 0.0 0.0 0.0  Prone 62.0 1.0 0.0 0.0 0.0 0.0 0.0  Upright 0.0 0.0 0.0 0.0 0.0 0.0 0.0  Total 370.0 29.0 0.0 0.0 0.0 0.0 0.0  Index 60.9 4.8 0.0 0.0 0.0 0.0 0.0   Threshold: 3% <100% <90% <80% <70% <60% <50% <40%  Supine 105.0 22.0 0.0 0.0 0.0 0.0 0.0  Side 97.0 6.0 0.0 0.0 0.0 0.0 0.0  Prone 40.0 1.0 0.0 0.0 0.0 0.0 0.0  Upright 0.0 0.0 0.0 0.0 0.0 0.0 0.0  Total 242.0 29.0 0.0 0.0 0.0 0.0 0.0  Index 39.8 4.8 0.0 0.0 0.0 0.0 0.0   Threshold: 4% <100% <90% <80% <70% <60% <50% <40%  Supine 78.0 22.0 0.0 0.0 0.0 0.0 0.0  Side 55.0 6.0 0.0 0.0 0.0 0.0 0.0  Prone 22.0 1.0 0.0 0.0 0.0 0.0 0.0  Upright 0.0 0.0 0.0 0.0 0.0 0.0 0.0  Total 155.0 29.0 0.0 0.0 0.0 0.0 0.0  Index 25.5 4.8 0.0 0.0 0.0 0.0 0.0   Threshold: 3% <100% <90% <80% <70% <60% <50% <40%  Supine 105 22 0 0 0 0 0  Side 97 6 0 0 0 0 0  Prone 40 1 0 0 0 0 0  Upright 0 0 0 0 0 0 0  Total 242 29 0 0 0 0 0   Awakening/Arousal Information # of Awakenings 40  Wake after sleep onset 87.68m  Wake after persistent sleep 87.90m   Arousal Assoc. Arousals Index  Apneas 103 22.3  Hypopneas 10 2.2  Leg Movements 3 0.6  Snore 0 0.0  PTT Arousals 0 0.0  Spontaneous 83 17.9  Total 199 43.0  Leg Movement Information PLMS LMs Index  Total LMs during PLMS 0 0.0  LMs w/  Microarousals 0 0.0   LM LMs Index  w/ Microarousal 3 0.6  w/ Awakening 2 0.4  w/ Resp Event 0 0.0  Spontaneous 4 0.9  Total 7 1.5     Desaturation threshold setting: 3% Minimum desaturation setting: 10 seconds SaO2 nadir: 57% The longest event was a 48 sec obstructive Apnea with a minimum SaO2 of 90%. The lowest SaO2 was 83% associated with a 16 sec obstructive Apnea. EKG Rates EKG Avg Max Min  Awake 84 113 62  Asleep 71 103 61  EKG Events: Tachycardia

## 2022-10-22 NOTE — Addendum Note (Signed)
Addended by: Huston Foley on: 10/22/2022 02:39 PM   Modules accepted: Orders

## 2022-10-22 NOTE — Telephone Encounter (Signed)
-----   Message from Huston Foley, MD sent at 10/22/2022  2:39 PM EST ----- Patient referred by PCP NP, seen by me on 08/20/22, diagnostic PSG on 10/17/22.   Please call and notify the patient that the recent sleep study showed severe obstructive sleep apnea. I recommend treatment for this in the form of CPAP. This will require a repeat sleep study for proper titration and mask fitting and correct monitoring of the oxygen saturations. Please explain to patient. I have placed an order in the chart.   FYI: she refused CPAP during the study as she reported claustrophobia, but PAP therapy is the most quickest and effective treatment for her. I highly recommend she return for her 2nd sleep study for PAP titration. We will work with her on mask fit and tolerance.  Thanks.  Huston Foley, MD, PhD [facility])

## 2022-10-23 LAB — THYROGLOBULIN ANTIBODY: Thyroglobulin Antibody: <1 IU/mL (ref 0.0–0.9)

## 2022-10-23 LAB — THYROID PEROXIDASE ANTIBODY: Thyroperoxidase Ab SerPl-aCnc: <9 IU/mL (ref 0–34)

## 2022-10-23 LAB — T4, FREE: Free T4: 1.29 ng/dL (ref 0.82–1.77)

## 2022-10-23 LAB — TSH: TSH: 1.04 u[IU]/mL (ref 0.450–4.500)

## 2022-10-23 NOTE — Telephone Encounter (Signed)
Spoke with the patient we discussed her sleep study results.  She verbalized understanding that her study did show severe obstructive sleep apnea.  The patient is amenable to trying a CPAP titration study.  She said that when they put the mask on during the last study she felt like she was suffocating.  She will give this a try however as I explained to her there are different types of masks that can be worn.  She verbalized appreciation for the call that she will watch for a call from our office to schedule the study.

## 2022-10-28 ENCOUNTER — Telehealth: Payer: Self-pay | Admitting: Neurology

## 2022-10-28 ENCOUNTER — Ambulatory Visit (HOSPITAL_COMMUNITY)
Admission: RE | Admit: 2022-10-28 | Discharge: 2022-10-28 | Disposition: A | Discharge status: Home / Self Care | Payer: BC Managed Care – PPO | Source: Ambulatory Visit | Attending: Nurse Practitioner | Admitting: Nurse Practitioner

## 2022-10-28 DIAGNOSIS — Z8349 Family history of other endocrine, nutritional and metabolic diseases: Secondary | ICD-10-CM | POA: Insufficient documentation

## 2022-10-28 DIAGNOSIS — R7989 Other specified abnormal findings of blood chemistry: Secondary | ICD-10-CM | POA: Insufficient documentation

## 2022-10-28 DIAGNOSIS — E039 Hypothyroidism, unspecified: Secondary | ICD-10-CM | POA: Diagnosis not present

## 2022-10-28 NOTE — Telephone Encounter (Signed)
[employer] pending faxed notes.  

## 2022-11-03 ENCOUNTER — Ambulatory Visit
Admission: EM | Admit: 2022-11-03 | Discharge: 2022-11-03 | Disposition: A | Discharge status: Home / Self Care | Payer: BC Managed Care – PPO | Attending: Family Medicine | Admitting: Family Medicine

## 2022-11-03 DIAGNOSIS — J22 Unspecified acute lower respiratory infection: Secondary | ICD-10-CM | POA: Diagnosis not present

## 2022-11-03 DIAGNOSIS — J4521 Mild intermittent asthma with (acute) exacerbation: Secondary | ICD-10-CM | POA: Diagnosis not present

## 2022-11-03 MED ORDER — AZITHROMYCIN 250 MG PO TABS: ORAL_TABLET | ORAL | 0 refills | Status: DC

## 2022-11-03 MED ORDER — PREDNISONE 20 MG PO TABS: 40.0000 mg | ORAL_TABLET | Freq: Every day | ORAL | 0 refills | Status: DC

## 2022-11-03 MED ORDER — ALBUTEROL SULFATE (2.5 MG/3ML) 0.083% IN NEBU
2.5000 mg | INHALATION_SOLUTION | Freq: Once | RESPIRATORY_TRACT | Status: AC
  Administered 2022-11-03: 2.5 mg via RESPIRATORY_TRACT

## 2022-11-03 MED ORDER — PROMETHAZINE-DM 6.25-15 MG/5ML PO SYRP: 5.0000 mL | ORAL_SOLUTION | Freq: Four times a day (QID) | ORAL | 0 refills | Status: DC | PRN

## 2022-11-03 MED ORDER — ALBUTEROL SULFATE HFA 108 (90 BASE) MCG/ACT IN AERS: 2.0000 | INHALATION_SPRAY | RESPIRATORY_TRACT | 0 refills | Status: AC | PRN

## 2022-11-03 NOTE — Telephone Encounter (Signed)
LVM for pt to call back to schedule   [employer] no auth req spoke via fax form

## 2022-11-03 NOTE — ED Provider Notes (Signed)
[facility]    CSN: [id number] Arrival date & time: 11/03/22  1725      History   Chief Complaint Chief Complaint  Patient presents with   Shortness of Breath   Cough         HPI Barbara Marshall is a 36 y.o. female.   Presenting today with 2 weeks of progressively worsening congestion, hacking productive cough, wheezing, chest tightness, shortness of breath.  Denies fever, chills, chest pain, abdominal pain, nausea vomiting or diarrhea.  Children had RSV several weeks ago and she and her husband started with a cough shortly after.  Taking Robitussin, Mucinex, her albuterol inhaler for her asthma as needed with minimal relief.    Past Medical History:  Diagnosis Date   Anxiety    Asthma    Dysmenorrhea    H/O   Heart murmur    History of IBS    HIV infection (HCC)    HSV infection    Plantar fasciitis    Pregnancy induced hypertension    Trichomonas infection     Patient Active Problem List   Diagnosis Date Noted   Respiratory infection 10/15/2022   Insomnia secondary to depression with anxiety 04/29/2022   Hypertension 03/24/2022   Plantar fasciitis, bilateral 10/29/2021   Anxiety and depression 10/21/2019   Genital herpes simplex 12/14/2018   Body mass index (BMI) of 25.0 to 29.9 12/14/2018   Seasonal and perennial allergic rhinitis 09/14/2018   Mild intermittent asthma without complication 09/14/2018   Human immunodeficiency virus (HIV) disease (HCC) 11/29/2016    Past Surgical History:  Procedure Laterality Date   WISDOM TOOTH EXTRACTION      OB History     Gravida  1   Para  1   Term      Preterm  1   AB      Living  2      SAB      IAB      Ectopic      Multiple  1   Live Births  2            Home Medications    Prior to Admission medications   Medication Sig Start Date End Date Taking? Authorizing Provider  albuterol (VENTOLIN HFA) 108 (90 Base) MCG/ACT inhaler Inhale 2 puffs into the lungs every 4  (four) hours as needed for wheezing or shortness of breath. 11/03/22  Yes Particia Nearing, PA-C  azithromycin (ZITHROMAX) 250 MG tablet Take first 2 tablets together, then 1 every day until finished. 11/03/22  Yes Particia Nearing, PA-C  predniSONE (DELTASONE) 20 MG tablet Take 2 tablets (40 mg total) by mouth daily with breakfast. 11/03/22  Yes Particia Nearing, PA-C  promethazine-dextromethorphan (PROMETHAZINE-DM) 6.25-15 MG/5ML syrup Take 5 mLs by mouth 4 (four) times daily as needed. 11/03/22  Yes Particia Nearing, PA-C  abacavir-dolutegravir-lamiVUDine (TRIUMEQ) 600-50-300 MG tablet TAKE 1 TABLET BY MOUTH EVERY DAY 11/24/18   [provider]  albuterol (VENTOLIN HFA) 108 (90 Base) MCG/ACT inhaler Inhale 1 puff into the lungs as needed. 05/23/22   [provider]  amLODipine (NORVASC) 5 MG tablet Take 1 tablet (5 mg total) by mouth daily. 08/28/22   Ameduite, Alvino Chapel, FNP  cetirizine (ZYRTEC) 10 MG tablet Take 1 tablet (10 mg total) by mouth daily. 04/01/22   Leath-Warren, Sadie Haber, NP  Estradiol-Norethindrone Acet 0.5-0.1 MG tablet Take 1 tablet by mouth daily. 08/28/22   Ameduite, Alvino Chapel, FNP  fluticasone (  FLONASE) 50 MCG/ACT nasal spray Place 2 sprays into both nostrils daily. 04/01/22   Leath-Warren, Sadie Haber, NP  gabapentin (NEURONTIN) 300 MG capsule TAKE 1 TO 2 TABLETS BY MOUTH AT BEDTIME 08/28/22   Cook, Jayce G, DO  ipratropium (ATROVENT) 0.06 % nasal spray Place 2 sprays into both nostrils 4 (four) times daily as needed for rhinitis. 10/15/22   Tommie Sams, DO  naproxen (NAPROSYN) 500 MG tablet Take 1 tablet (500 mg total) by mouth 2 (two) times daily with a meal. 10/22/22   Darreld Mclean, MD  PROAIR HFA 108 [id number] Base) MCG/ACT inhaler TAKE 2 PUFFS BY MOUTH EVERY 6 HOURS AS NEEDED FOR WHEEZE OR SHORTNESS OF BREATH 12/27/18   Alfonse Spruce, MD  traZODone (DESYREL) 50 MG tablet Take 50 mg by mouth at bedtime. 06/17/22   [provider]  valsartan (DIOVAN) 320 MG tablet Take 1 tablet (320 mg total) by mouth daily. 07/17/22   [employer]med[employer], Alvino Chapel, FNP    Family History Family History  Problem Relation Age of Onset   Cancer Father        BRAIN   Diabetes Father    Asthma Father    Thyroid disease Mother    Migraines Mother    Allergic rhinitis Mother    Eczema Neg Hx    Urticaria Neg Hx     Social History Social History   Tobacco Use   Smoking status: Never   Smokeless tobacco: Never  Vaping Use   Vaping Use: Never used  Substance Use Topics   Alcohol use: Yes    Comment: SOCIAL rare once year   Drug use: No     Allergies   Citalopram   Review of Systems Review of Systems Per HPI  Physical Exam Triage Vital Signs ED Triage Vitals  Enc Vitals Group     BP 11/03/22 1826 (!) 143/90     Pulse Rate 11/03/22 1826 86     Resp 11/03/22 1826 18     Temp 11/03/22 1826 98 F (36.7 C)     Temp Source 11/03/22 1826 Oral     SpO2 11/03/22 1826 95 %     Weight --      Height --      Head Circumference --      Peak Flow --      Pain Score 11/03/22 1824 0     Pain Loc --      Pain Edu? --      Excl. in GC? --    No data found.  Updated Vital Signs BP (!) 143/90 (BP Location: Right Arm)   Pulse 86   Temp 98 F (36.7 C) (Oral)   Resp 18   SpO2 95%   Visual Acuity Right Eye Distance:   Left Eye Distance:   Bilateral Distance:    Right Eye Near:   Left Eye Near:    Bilateral Near:     Physical Exam Vitals and nursing note reviewed.  Constitutional:      Appearance: Normal appearance.  HENT:     Head: Atraumatic.     Right Ear: Tympanic membrane and external ear normal.     Left Ear: Tympanic membrane and external ear normal.     Nose: Congestion present.     Mouth/Throat:     Mouth: Mucous membranes are moist.     Pharynx: Posterior oropharyngeal erythema present.  Eyes:     Extraocular Movements: Extraocular movements intact.  Conjunctiva/sclera: Conjunctivae normal.   Cardiovascular:     Rate and Rhythm: Normal rate and regular rhythm.     Heart sounds: Normal heart sounds.  Pulmonary:     Effort: Pulmonary effort is normal.     Breath sounds: Wheezing present. No rales.  Musculoskeletal:        General: Normal range of motion.     Cervical back: Normal range of motion and neck supple.  Skin:    General: Skin is warm and dry.  Neurological:     Mental Status: She is alert and oriented to person, place, and time.  Psychiatric:        Mood and Affect: Mood normal.        Thought Content: Thought content normal.      UC Treatments / Results  Labs (all labs ordered are listed, but only abnormal results are displayed) Labs Reviewed - No data to display  EKG   Radiology No results found.  Procedures Procedures (including critical care time)  Medications Ordered in UC Medications  albuterol (PROVENTIL) (2.5 MG/3ML) 0.083% nebulizer solution 2.5 mg (2.5 mg Nebulization Given 11/03/22 1925)    Initial Impression / Assessment and Plan / UC Course  I have reviewed the triage vital signs and the nursing notes.  Pertinent labs & imaging results that were available during my care of the patient were reviewed by me and considered in my medical decision making (see chart for details).     Vital signs overall reassuring today, exam revealing significant wheezes bilaterally, coughing fits with any sort of deep breath.  Very mild improvement with albuterol inhaler, will treat for asthma exacerbation and lower respiratory infection with prednisone, azithromycin, Phenergan DM, albuterol inhaler.  Discussed supportive care and return precautions  Final Clinical Impressions(s) / UC Diagnoses   Final diagnoses:  Lower respiratory infection  Mild intermittent asthma with acute exacerbation   Discharge Instructions   None    ED Prescriptions     Medication Sig Dispense Auth. Provider   predniSONE (DELTASONE) 20 MG tablet Take 2 tablets (40 mg  total) by mouth daily with breakfast. 10 tablet Particia Nearing, PA-C   azithromycin (ZITHROMAX) 250 MG tablet Take first 2 tablets together, then 1 every day until finished. 6 tablet Particia Nearing, [place]   promethazine-dextromethorphan (PROMETHAZINE-DM) 6.25-15 MG/5ML syrup Take 5 mLs by mouth 4 (four) times daily as needed. 100 mL Particia Nearing, PA-C   albuterol (VENTOLIN HFA) 108 (90 Base) MCG/ACT inhaler Inhale 2 puffs into the lungs every 4 (four) hours as needed for wheezing or shortness of breath. 18 g Particia Nearing, [place]      PDMP not reviewed this encounter.   Particia Nearing, [place] 11/03/22 1943

## 2022-11-03 NOTE — ED Triage Notes (Signed)
Pt reports cough and shortness of breath x 2 weeks. Cough is worse at night. Reports her kids had RSV. Robitussin gives no relief.

## 2022-11-05 ENCOUNTER — Ambulatory Visit: Payer: BC Managed Care – PPO | Admitting: Orthopaedic Surgery

## 2022-11-18 ENCOUNTER — Ambulatory Visit: Payer: BC Managed Care – PPO | Admitting: Nurse Practitioner

## 2022-11-24 ENCOUNTER — Encounter: Payer: Self-pay | Admitting: Family Medicine

## 2022-11-24 ENCOUNTER — Other Ambulatory Visit: Payer: Self-pay | Admitting: Family Medicine

## 2022-11-24 MED ORDER — LABETALOL HCL 100 MG PO TABS: 100.0000 mg | ORAL_TABLET | Freq: Two times a day (BID) | ORAL | 3 refills | Status: DC

## 2022-11-30 DIAGNOSIS — Z8759 Personal history of other complications of pregnancy, childbirth and the puerperium: Secondary | ICD-10-CM | POA: Insufficient documentation

## 2022-11-30 DIAGNOSIS — O09529 Supervision of elderly multigravida, unspecified trimester: Secondary | ICD-10-CM | POA: Insufficient documentation

## 2022-11-30 DIAGNOSIS — O09523 Supervision of elderly multigravida, third trimester: Secondary | ICD-10-CM | POA: Insufficient documentation

## 2022-12-08 NOTE — L&D Delivery Note (Signed)
Delivery Note Labor onset: 06/01/2023  Labor Onset Time: 1513 Complete dilation at 12:22 AM  Onset of pushing at 1225 FHR second stage Cat 1 Analgesia/Anesthesia intrapartum: epidural  Guided pushing with maternal urge. Delivery of a viable female at 11. Fetal head delivered in direct OA position.  Nuchal cord: none.  Infant placed on maternal abd, dried, and tactile stim.  Cord double clamped after 6 min and cut by father, Joselyn Glassman.  RN x2 present for birth.  Cord blood sample collected: Yes Arterial cord blood sample collected: No  Placenta delivered Tomasa Blase, intact, with 3 VC.  Placenta to L&D. Uterine tone firm, bleeding small, no clots  No laceration identified.  QBL/EBL (mL): 250 Complications: none APGAR: APGAR (1 MIN): 8   APGAR (5 MINS): 9   APGAR (10 MINS):   Mom to postpartum.  Baby to Couplet care / Skin to Skin. Baby girl "name to be decided"   Roma Schanz DNP, CNM 06/02/2023, 1:15 AM

## 2022-12-11 DIAGNOSIS — G2581 Restless legs syndrome: Secondary | ICD-10-CM | POA: Diagnosis not present

## 2022-12-11 DIAGNOSIS — N912 Amenorrhea, unspecified: Secondary | ICD-10-CM | POA: Diagnosis not present

## 2022-12-11 DIAGNOSIS — N925 Other specified irregular menstruation: Secondary | ICD-10-CM | POA: Diagnosis not present

## 2022-12-16 ENCOUNTER — Ambulatory Visit: Payer: BC Managed Care – PPO | Admitting: Nurse Practitioner

## 2023-01-26 DIAGNOSIS — N925 Other specified irregular menstruation: Secondary | ICD-10-CM | POA: Diagnosis not present

## 2023-01-26 LAB — OB RESULTS CONSOLE ANTIBODY SCREEN: Antibody Screen: NEGATIVE

## 2023-01-26 LAB — OB RESULTS CONSOLE RUBELLA ANTIBODY, IGM: Rubella: IMMUNE

## 2023-01-26 LAB — OB RESULTS CONSOLE HEPATITIS B SURFACE ANTIGEN: Hepatitis B Surface Ag: NEGATIVE

## 2023-01-26 LAB — OB RESULTS CONSOLE RPR: RPR: NONREACTIVE

## 2023-01-26 LAB — OB RESULTS CONSOLE ABO/RH: RH Type: POSITIVE

## 2023-01-26 LAB — OB RESULTS CONSOLE HIV ANTIBODY (ROUTINE TESTING): HIV: REACTIVE

## 2023-02-05 ENCOUNTER — Other Ambulatory Visit: Payer: Self-pay | Admitting: Obstetrics and Gynecology

## 2023-02-05 ENCOUNTER — Encounter: Payer: Self-pay | Admitting: Radiology

## 2023-02-05 DIAGNOSIS — Z363 Encounter for antenatal screening for malformations: Secondary | ICD-10-CM

## 2023-02-06 LAB — OB RESULTS CONSOLE GC/CHLAMYDIA
Chlamydia: NEGATIVE
Neisseria Gonorrhea: NEGATIVE

## 2023-02-11 ENCOUNTER — Emergency Department (HOSPITAL_BASED_OUTPATIENT_CLINIC_OR_DEPARTMENT_OTHER): Payer: BC Managed Care – PPO

## 2023-02-11 ENCOUNTER — Emergency Department (HOSPITAL_BASED_OUTPATIENT_CLINIC_OR_DEPARTMENT_OTHER)
Admission: EM | Admit: 2023-02-11 | Discharge: 2023-02-11 | Disposition: A | Discharge status: Home / Self Care | Payer: BC Managed Care – PPO | Attending: Emergency Medicine | Admitting: Emergency Medicine

## 2023-02-11 ENCOUNTER — Encounter (HOSPITAL_BASED_OUTPATIENT_CLINIC_OR_DEPARTMENT_OTHER): Payer: Self-pay | Admitting: Emergency Medicine

## 2023-02-11 ENCOUNTER — Other Ambulatory Visit: Payer: Self-pay

## 2023-02-11 DIAGNOSIS — Z3A21 21 weeks gestation of pregnancy: Secondary | ICD-10-CM | POA: Diagnosis not present

## 2023-02-11 DIAGNOSIS — O10912 Unspecified pre-existing hypertension complicating pregnancy, second trimester: Secondary | ICD-10-CM | POA: Insufficient documentation

## 2023-02-11 DIAGNOSIS — R140 Abdominal distension (gaseous): Secondary | ICD-10-CM | POA: Diagnosis not present

## 2023-02-11 DIAGNOSIS — O26892 Other specified pregnancy related conditions, second trimester: Secondary | ICD-10-CM | POA: Diagnosis not present

## 2023-02-11 DIAGNOSIS — K8020 Calculus of gallbladder without cholecystitis without obstruction: Secondary | ICD-10-CM | POA: Diagnosis not present

## 2023-02-11 DIAGNOSIS — R1011 Right upper quadrant pain: Secondary | ICD-10-CM | POA: Diagnosis not present

## 2023-02-11 DIAGNOSIS — I10 Essential (primary) hypertension: Secondary | ICD-10-CM | POA: Diagnosis not present

## 2023-02-11 LAB — CBC WITH DIFFERENTIAL/PLATELET
Abs Immature Granulocytes: 0.04 10*3/uL (ref 0.00–0.07)
Basophils Absolute: 0 10*3/uL (ref 0.0–0.1)
Basophils Relative: 0 %
Eosinophils Absolute: 0.4 10*3/uL (ref 0.0–0.5)
Eosinophils Relative: 3 %
HCT: 32.3 % — ABNORMAL LOW (ref 36.0–46.0)
Hemoglobin: 11.3 g/dL — ABNORMAL LOW (ref 12.0–15.0)
Immature Granulocytes: 0 %
Lymphocytes Relative: 25 %
Lymphs Abs: 2.7 10*3/uL (ref 0.7–4.0)
MCH: 31.7 pg (ref 26.0–34.0)
MCHC: 35 g/dL (ref 30.0–36.0)
MCV: 90.7 fL (ref 80.0–100.0)
Monocytes Absolute: 0.6 10*3/uL (ref 0.1–1.0)
Monocytes Relative: 6 %
Neutro Abs: 6.8 10*3/uL (ref 1.7–7.7)
Neutrophils Relative %: 66 %
Platelets: 248 10*3/uL (ref 150–400)
RBC: 3.56 MIL/uL — ABNORMAL LOW (ref 3.87–5.11)
RDW: 12.7 % (ref 11.5–15.5)
WBC: 10.5 10*3/uL (ref 4.0–10.5)
nRBC: 0 % (ref 0.0–0.2)

## 2023-02-11 LAB — COMPREHENSIVE METABOLIC PANEL
ALT: 5 U/L (ref 0–44)
AST: 8 U/L — ABNORMAL LOW (ref 15–41)
Albumin: 3.3 g/dL — ABNORMAL LOW (ref 3.5–5.0)
Alkaline Phosphatase: 67 U/L (ref 38–126)
Anion gap: 8 (ref 5–15)
BUN: 6 mg/dL (ref 6–20)
CO2: 25 mmol/L (ref 22–32)
Calcium: 9.6 mg/dL (ref 8.9–10.3)
Chloride: 104 mmol/L (ref 98–111)
Creatinine, Ser: 0.64 mg/dL (ref 0.44–1.00)
GFR, Estimated: >60 mL/min (ref >60)
Glucose, Bld: 88 mg/dL (ref 70–99)
Potassium: 3.7 mmol/L (ref 3.5–5.1)
Sodium: 137 mmol/L (ref 135–145)
Total Bilirubin: 0.2 mg/dL — ABNORMAL LOW (ref 0.3–1.2)
Total Protein: 7 g/dL (ref 6.5–8.1)

## 2023-02-11 LAB — URINALYSIS, ROUTINE W REFLEX MICROSCOPIC
Bilirubin Urine: NEGATIVE
Glucose, UA: NEGATIVE mg/dL
Hgb urine dipstick: NEGATIVE
Ketones, ur: NEGATIVE mg/dL
Leukocytes,Ua: NEGATIVE
Nitrite: NEGATIVE
Specific Gravity, Urine: 1.029 (ref 1.005–1.030)
pH: 6 (ref 5.0–8.0)

## 2023-02-11 LAB — LIPASE, BLOOD: Lipase: 57 U/L — ABNORMAL HIGH (ref 11–51)

## 2023-02-11 MED ORDER — LACTATED RINGERS IV BOLUS
1000.0000 mL | Freq: Once | INTRAVENOUS | Status: AC
  Administered 2023-02-11: 1000 mL via INTRAVENOUS

## 2023-02-11 MED ORDER — HYDROMORPHONE HCL 1 MG/ML IJ SOLN
1.0000 mg | Freq: Once | INTRAMUSCULAR | Status: AC
  Administered 2023-02-11: 1 mg via INTRAVENOUS
  Filled 2023-02-11: qty 1

## 2023-02-11 NOTE — ED Provider Notes (Signed)
[facility] Provider Note   CSN: [id number] Arrival date & time: 02/11/23  0242     History  Chief Complaint  Patient presents with   Abdominal Pain    Barbara Marshall is a 37 y.o. female.  37 year old female who is pregnant at [redacted] weeks by last menstrual period the presents ER today secondary to right upper quadrant pain that is sharp and nature.  Also has some back pain with that as well.  Nausea but no vomiting.  Is different than anything she had a pregnancy before.  She has known hypertension and also actually had preeclampsia in her last pregnancy.  No history of HELLP.  No suspicious food intake.   Abdominal Pain      Home Medications Prior to Admission medications   Medication Sig Start Date End Date Taking? Authorizing Provider  labetalol (NORMODYNE) 100 MG tablet Take 1 tablet (100 mg total) by mouth 2 (two) times daily. 11/24/22   Cook, Barnie Del, DO  abacavir-dolutegravir-lamiVUDine (TRIUMEQ) 600-50-300 MG tablet TAKE 1 TABLET BY MOUTH EVERY DAY 11/24/18   [provider]  albuterol (VENTOLIN HFA) 108 (90 Base) MCG/ACT inhaler Inhale 1 puff into the lungs as needed. 05/23/22   [provider]  albuterol (VENTOLIN HFA) 108 (90 Base) MCG/ACT inhaler Inhale 2 puffs into the lungs every 4 (four) hours as needed for wheezing or shortness of breath. 11/03/22   Volney American, PA-C  amLODipine (NORVASC) 5 MG tablet Take 1 tablet (5 mg total) by mouth daily. 08/28/22   Ameduite, Trenton Gammon, FNP  cetirizine (ZYRTEC) 10 MG tablet Take 1 tablet (10 mg total) by mouth daily. 04/01/22   Leath-Warren, Alda Lea, NP  Estradiol-Norethindrone Acet 0.5-0.1 MG tablet Take 1 tablet by mouth daily. 08/28/22   Ameduite, Trenton Gammon, FNP  fluticasone (FLONASE) 50 MCG/ACT nasal spray Place 2 sprays into both nostrils daily. 04/01/22   Leath-Warren, Alda Lea, NP  gabapentin (NEURONTIN) 300 MG capsule TAKE 1 TO 2 TABLETS BY MOUTH AT  BEDTIME 08/28/22   Cook, Jayce G, DO  ipratropium (ATROVENT) 0.06 % nasal spray Place 2 sprays into both nostrils 4 (four) times daily as needed for rhinitis. 10/15/22   Coral Spikes, DO  naproxen (NAPROSYN) 500 MG tablet Take 1 tablet (500 mg total) by mouth 2 (two) times daily with a meal. 10/22/22   Sanjuana Kava, MD  PROAIR HFA 108 [phone] Base) MCG/ACT inhaler TAKE 2 PUFFS BY MOUTH EVERY 6 HOURS AS NEEDED FOR WHEEZE OR SHORTNESS OF BREATH 12/27/18   Valentina Shaggy, MD  promethazine-dextromethorphan (PROMETHAZINE-DM) 6.25-15 MG/5ML syrup Take 5 mLs by mouth 4 (four) times daily as needed. 11/03/22   Volney American, PA-C  traZODone (DESYREL) 50 MG tablet Take 50 mg by mouth at bedtime. 06/17/22   [provider]      Allergies    Citalopram    Review of Systems   Review of Systems  Gastrointestinal:  Positive for abdominal pain.    Physical Exam Updated Vital Signs BP (!) 161/93   Pulse 64   Temp 98.2 F (36.8 C) (Oral)   Resp 17   Ht '5\' 3"'$  (1.6 m)   Wt 89.8 kg   SpO2 99%   BMI 35.07 kg/m  Physical Exam Vitals and nursing note reviewed.  Constitutional:      Appearance: She is well-developed.  HENT:     Head: Normocephalic and atraumatic.  Cardiovascular:     Rate and  Rhythm: Normal rate and regular rhythm.  Pulmonary:     Effort: No respiratory distress.     Breath sounds: No stridor.  Abdominal:     General: Bowel sounds are normal. There is no distension.     Palpations: Abdomen is soft.     Tenderness: There is abdominal tenderness in the right upper quadrant.  Musculoskeletal:     Cervical back: Normal range of motion.  Neurological:     Mental Status: She is alert.     ED Results / Procedures / Treatments   Labs (all labs ordered are listed, but only abnormal results are displayed) Labs Reviewed  COMPREHENSIVE METABOLIC PANEL - Abnormal; Notable for the following components:      Result Value   Albumin 3.3 (*)    AST 8 (*)    Total  Bilirubin 0.2 (*)    All other components within normal limits  LIPASE, BLOOD - Abnormal; Notable for the following components:   Lipase 57 (*)    All other components within normal limits  CBC WITH DIFFERENTIAL/PLATELET - Abnormal; Notable for the following components:   RBC 3.56 (*)    Hemoglobin 11.3 (*)    HCT 32.3 (*)    All other components within normal limits  URINALYSIS, ROUTINE W REFLEX MICROSCOPIC - Abnormal; Notable for the following components:   Protein, ur TRACE (*)    All other components within normal limits    EKG None  Radiology US Abdomen Limited RUQ (LIVER/GB)  Result Date: 02/11/2023 CLINICAL DATA:  37 year old female with right upper quadrant pain since 20/1 100 hours. Pregnant in the 2nd trimester. EXAM: ULTRASOUND ABDOMEN LIMITED RIGHT UPPER QUADRANT COMPARISON:  Abdomen ultrasound 06/12/2021. FINDINGS: Gallbladder: Gallbladder appears mildly distended (image 2). Gallbladder wall thickness remains normal. Nonshadowing tumefactive sludge or gallstone at the fundus is 7 mm (image 14). Shadowing echogenic stone demonstrated in the neck of the gallbladder (image 27), 8 mm. No pericholecystic fluid. No sonographic Murphy sign elicited. Common bile duct: Diameter: 4 mm, normal. Liver: No focal lesion identified. Within normal limits in parenchymal echogenicity. Portal vein is patent on color Doppler imaging with normal direction of blood flow towards the liver. Other: Negative visible right kidney (image 53). IMPRESSION: 1. An 8 mm gallstone appears lodged in the gallbladder neck, with mild gallbladder distension. But no wall thickening or sonographic Murphy sign at this time to suggest acute cholecystitis. No evidence of bile duct obstruction. 2. Normal liver. Electronically Signed   By: Genevie Ann M.D.   On: 02/11/2023 05:41    Procedures Procedures    Medications Ordered in ED Medications  HYDROmorphone (DILAUDID) injection 1 mg (1 mg Intravenous Given 02/11/23 0352)   lactated ringers bolus 1,000 mL (0 mLs Intravenous Stopped 02/11/23 0600)    ED Course/ Medical Decision Making/ A&P                             Medical Decision Making Amount and/or Complexity of Data Reviewed Labs: ordered. Radiology: ordered.  Risk Prescription drug management.  Evaluate for cholecystitis/cholelithiasis/HELLP.  Blood pressures slightly elevated but it sounds like it is normal for her.  Pain and symptoms all improved.  Her ultrasound did not show a stone in the gallbladder neck with mild distention but no other signs of cholecystitis.  White count is normal.  Liver enzymes normal.  Lipase slightly elevated but is probably related to the pain and nausea rather than choledocholithiasis as  it was only a 41.  Discussed with Dr. Thermon Leyland on recommendations.  He stated that if she had persistent pain and they would have no problem taking it out or if she had signs of inflammation on the ultrasound.  Otherwise he suggested following up as an outpatient and plan for surgery after delivery.  No evidence of HELLP, urine is negative as well.  Seems that this blood pressure is probably close to her baseline and she is already on labetalol as she has chronic hypertension.  Also not in the third trimester. All of this discussed with her and husband. Stable for d/c.   Final Clinical Impression(s) / ED Diagnoses Final diagnoses:  Calculus of gallbladder without cholecystitis without obstruction  Pre-existing hypertension during pregnancy in second trimester, unspecified pre-existing hypertension type    Rx / DC Orders ED Discharge Orders     None         Adyn Serna, Corene Cornea, MD 02/11/23 0725

## 2023-02-11 NOTE — ED Notes (Signed)
Patient transported to Ultrasound 

## 2023-02-11 NOTE — ED Triage Notes (Signed)
Patient c/o RUQ abdominal pain that started at 2100.  Patient reports she is [redacted] weeks pregnant.  Patient denies contractions, discharge, fluid leaking, etc.

## 2023-02-17 ENCOUNTER — Other Ambulatory Visit: Payer: Self-pay | Admitting: Family Medicine

## 2023-02-24 ENCOUNTER — Other Ambulatory Visit: Payer: Self-pay

## 2023-02-24 ENCOUNTER — Encounter: Payer: Self-pay | Admitting: *Deleted

## 2023-02-26 ENCOUNTER — Ambulatory Visit (INDEPENDENT_AMBULATORY_CARE_PROVIDER_SITE_OTHER): Payer: BC Managed Care – PPO | Admitting: Family Medicine

## 2023-02-26 VITALS — BP 135/85 | Ht 64.0 in | Wt 202.8 lb

## 2023-02-26 DIAGNOSIS — I10 Essential (primary) hypertension: Secondary | ICD-10-CM

## 2023-02-26 DIAGNOSIS — B20 Human immunodeficiency virus [HIV] disease: Secondary | ICD-10-CM

## 2023-02-26 NOTE — Assessment & Plan Note (Signed)
Chronic hypertension.  Now pregnant.  Continue labetalol.  No need for additional pharmacotherapy at this time.

## 2023-02-26 NOTE — Progress Notes (Signed)
Subjective:  Patient ID: Barbara Marshall, female    DOB: Oct 18, 1986  Age: 37 y.o. MRN: [id number]  CC: Chief Complaint  Patient presents with   Hypertension    Follow up Patient states her blood pressure has been running high and is currently pregnant and had to change meds- had preeclampsia with last pregnancy    HPI:  82-year female with HIV, chronic hypertension, asthma, history of genital herpes, anxiety and depression presents for follow-up.  Patient is currently pregnant.  Previous medications were discontinued and patient was placed on labetalol.  She still has amlodipine at home but has not needed it.  BP 135/85 today.  She is following closely with OB/GYN.  She continues on her HIV medication.  This is okay in pregnancy per OB/GYN after discussion with maternal-fetal medicine.  She is overall feeling well.  No significant nausea.  No bleeding.  Patient Active Problem List   Diagnosis Date Noted   Insomnia secondary to depression with anxiety 04/29/2022   Hypertension 03/24/2022   Plantar fasciitis, bilateral 10/29/2021   Anxiety and depression 10/21/2019   Genital herpes simplex 12/14/2018   Seasonal and perennial allergic rhinitis 09/14/2018   Mild intermittent asthma without complication [id number]   Human immunodeficiency virus (HIV) disease ([place]) 11/29/2016    Social Hx   Social History   Socioeconomic History   Marital status: Married    Spouse name: Not on file   Number of children: Not on file   Years of education: Not on file   Highest education level: Some college, no degree  Occupational History   Not on file  Tobacco Use   Smoking status: Never   Smokeless tobacco: Never  Vaping Use   Vaping Use: Never used  Substance and Sexual Activity   Alcohol use: Not Currently    Comment: SOCIAL rare once year   Drug use: No   Sexual activity: Yes    Birth control/protection: Condom  Other Topics Concern   Not on file  Social History Narrative    Caffiene 1 dr pepper daily (8-16oz). Tea decaff.      Education:  some college      Work [employer]      Married, 2 Bio and 1 Step Daughter.   Social Determinants of Health   Financial Resource Strain: Low Risk  (02/24/2023)   Overall Financial Resource Strain (CARDIA)    Difficulty of Paying Living Expenses: Not hard at all  Food Insecurity: No Food Insecurity (02/24/2023)   Hunger Vital Sign    Worried About Running Out of Food in the Last Year: Never true    Ran Out of Food in the Last Year: Never true  Transportation Needs: No Transportation Needs (02/24/2023)   PRAPARE - Hydrologist (Medical): No    Lack of Transportation (Non-Medical): No  Physical Activity: Insufficiently Active (02/24/2023)   Exercise Vital Sign    Days of Exercise per Week: 1 day    Minutes of Exercise per Session: 20 min  Stress: Stress Concern Present (02/24/2023)   [place]    Feeling of Stress : To some extent  Social Connections: Socially Integrated (02/24/2023)   Social Connection and Isolation Panel [NHANES]    Frequency of Communication with Friends and Family: Three times a week    Frequency of Social Gatherings with Friends and Family: Twice a week    Attends Religious Services: More than 4  times per year    Active Member of Clubs or Organizations: Yes    Attends Archivist Meetings: More than 4 times per year    Marital Status: Married    Review of Systems Per HPI  Objective:  BP 135/85   Ht 5\' 4"  (1.626 m)   Wt 202 lb 12.8 oz (92 kg)   LMP  (LMP Unknown)   BMI 34.81 kg/m      02/26/2023    1:14 PM 02/11/2023    6:00 AM 02/11/2023    3:05 AM  BP/Weight  Systolic BP [id number]   Diastolic BP 85 93   Wt. (Lbs) 202.8  198  BMI 34.81 kg/m2  35.07 kg/m2    Physical Exam Vitals and nursing note reviewed.  Constitutional:      General: She is not in acute distress.     Appearance: Normal appearance.  HENT:     Head: Normocephalic and atraumatic.  Cardiovascular:     Rate and Rhythm: Normal rate and regular rhythm.  Pulmonary:     Effort: Pulmonary effort is normal.     Breath sounds: Normal breath sounds.  Neurological:     Mental Status: She is alert.  Psychiatric:        Mood and Affect: Mood normal.        Behavior: Behavior normal.     Lab Results  Component Value Date   WBC 10.5 02/11/2023   HGB 11.3 (L) 02/11/2023   HCT 32.3 (L) 02/11/2023   PLT 248 02/11/2023   GLUCOSE 88 02/11/2023   CHOL 151 11/28/2016   TRIG 83 11/28/2016   HDL 55 11/28/2016   LDLCALC 79 11/28/2016   ALT 5 02/11/2023   AST 8 (L) 02/11/2023   NA 137 02/11/2023   K 3.7 02/11/2023   CL 104 02/11/2023   CREATININE 0.64 02/11/2023   BUN 6 02/11/2023   CO2 25 02/11/2023   TSH 1.040 10/21/2022   HGBA1C 5.0 01/07/2017     Assessment & Plan:   Problem List Items Addressed This Visit       Cardiovascular and Mediastinum   Hypertension - Primary    Chronic hypertension.  Now pregnant.  Continue labetalol.  No need for additional pharmacotherapy at this time.      Relevant Medications   aspirin 81 MG chewable tablet     Other   Human immunodeficiency virus (HIV) disease ([place])    Has been stable.  Following with ID.       Follow-up:  6 months to 1 year  [place]

## 2023-02-26 NOTE — Assessment & Plan Note (Signed)
Has been stable.  Following with ID.

## 2023-02-26 NOTE — Patient Instructions (Signed)
Goal to stay below 140/90.  Continue the labetolol.  Best of luck.  Follow up in 6 months to 1 year.

## 2023-02-27 ENCOUNTER — Ambulatory Visit: Payer: BC Managed Care – PPO | Attending: Obstetrics and Gynecology | Admitting: Obstetrics and Gynecology

## 2023-02-27 ENCOUNTER — Other Ambulatory Visit: Payer: Self-pay | Admitting: *Deleted

## 2023-02-27 ENCOUNTER — Ambulatory Visit: Payer: BC Managed Care – PPO | Admitting: *Deleted

## 2023-02-27 ENCOUNTER — Ambulatory Visit: Payer: BC Managed Care – PPO | Attending: Obstetrics and Gynecology

## 2023-02-27 ENCOUNTER — Encounter: Payer: Self-pay | Admitting: *Deleted

## 2023-02-27 VITALS — BP 160/99 | HR 78

## 2023-02-27 DIAGNOSIS — O09892 Supervision of other high risk pregnancies, second trimester: Secondary | ICD-10-CM

## 2023-02-27 DIAGNOSIS — B20 Human immunodeficiency virus [HIV] disease: Secondary | ICD-10-CM | POA: Diagnosis not present

## 2023-02-27 DIAGNOSIS — Z363 Encounter for antenatal screening for malformations: Secondary | ICD-10-CM | POA: Insufficient documentation

## 2023-02-27 DIAGNOSIS — O09522 Supervision of elderly multigravida, second trimester: Secondary | ICD-10-CM | POA: Insufficient documentation

## 2023-02-27 DIAGNOSIS — O43192 Other malformation of placenta, second trimester: Secondary | ICD-10-CM | POA: Diagnosis not present

## 2023-02-27 DIAGNOSIS — Z3A24 24 weeks gestation of pregnancy: Secondary | ICD-10-CM

## 2023-02-27 DIAGNOSIS — O98712 Human immunodeficiency virus [HIV] disease complicating pregnancy, second trimester: Secondary | ICD-10-CM | POA: Diagnosis not present

## 2023-02-27 DIAGNOSIS — O09292 Supervision of pregnancy with other poor reproductive or obstetric history, second trimester: Secondary | ICD-10-CM

## 2023-02-27 DIAGNOSIS — O09212 Supervision of pregnancy with history of pre-term labor, second trimester: Secondary | ICD-10-CM

## 2023-02-27 DIAGNOSIS — O10912 Unspecified pre-existing hypertension complicating pregnancy, second trimester: Secondary | ICD-10-CM

## 2023-02-27 DIAGNOSIS — O10012 Pre-existing essential hypertension complicating pregnancy, second trimester: Secondary | ICD-10-CM

## 2023-02-27 NOTE — Progress Notes (Signed)
Maternal-Fetal Medicine   Name: Ellagrace Mittendorf DOB: 04-20-1986 MRN: [id number] Referring Provider: Donnel Saxon, CNM  I had the pleasure of seeing Ms. Barbara Marshall today at the [facility]. She was accompanied by her husband. She is G2 P0102 at 24w 3d gestation and is here for fetal anatomy scan. Her high-risk problems include: -Chronic hypertension.  Patient takes labetalol 100 mg twice daily.  Blood pressures today at her office were 157/94 and 160/99 mmHg.  She does not have signs and symptoms of severe features of preeclampsia.  Patient takes low-dose aspirin prophylaxis. -HIV infection in pregnancy.  Patient is being followed by ID physician.  She reports that her recent viral RNA is undetectable.  She takes antiretroviral medications regularly.  Patient does not have AIDS. -History of twin pregnancy with preeclampsia with severe features.  Patient had vaginal delivery.  Both her boys are in good health. -Advanced maternal age. Patient had opted not to screen for fetal aneuploidies.  Ultrasound We performed fetal anatomical survey.  Amniotic fluid is normal good fetal activity seen.  Fetal biometry is consistent with the previously established dates.  Fetal anatomical survey appears normal.  No markers of aneuploidy's are seen. Marginal cord insertion is seen. Patient understands the limitations of ultrasound in detecting fetal anomalies.   Chronic hypertension -Adverse outcomes of severe chronic hypertension include maternal stroke, endorgan damage, coagulation disturbances. Placental abruption is more common. -Superimposed preeclampsia occurs in more than 30% of women with chronic hypertension. History of preeclampsia is associated with increased recurrence risk up to 50% I discussed the benefit of low-dose aspirin prophylaxis that helps delaying or preventing preeclampsia. -I discussed the safety profile of antihypertensives.  Labetalol can be safely given in pregnancy.   Alternative medications include nifedipine. -I discussed ultrasound protocol of monitoring fetal growth assessment and antenatal testing.  -Timing of delivery: Provided her blood pressures are well controlled, she can be delivered at 39 weeks' gestation.  Early term delivery is an option if hypertension is not well controlled.  Based on today's increased blood pressures, I recommended that she increase labetalol to 200 milligrams twice daily now and increase as needed.  Patient has blood pressure cuff at home and checks her blood pressures regularly.  I discussed the normal parameters.  HIV in pregnancy -I emphasized the importance of continuing antiretroviral (ARV) medications and continuing throughout her pregnancy. ARV reduces the likelihood (but does not eliminate) of perinatal transmission. The risk of transmission with undetectable viral load is less than 1%. -Intrapartum management with zidovudine is indicated if viral load is greater than 1,000 copies/mL.. -According to U.S. Department of Health and Human Services Guidelines, Intravenous zidovudine should be administered if viral RNA is greater than 1,000 copies/mL. If viral RNA levels are at 50 copies/mL or less, intrapartum zidovudine is not necessary. They also mention that intravenous zidovudine "may be considered for women with HIV RNA between 50 and 999 copies/mL." -ACOG also endorses this view (ACOG Committee Opinion, No 757, September 2018). The likelihood of perinatal transmission without ZDV in this group (between 50 and 999 copies) is about 1% to 2% (as opposed to less than 1%). -Based on the above information, patient should be offered zidovudine prophylaxis during labor. If viral RNA continues to be undetectable, patient can have spontaneous onset of labor or undergo induction of labor at 39 weeks' gestation.  Marginal cord insertion I explained the finding with help of ultrasound images.  Marginal cord insertion is only rarely  associated with fetal growth  restriction and patient will be having serial fetal growth assessments.  Recommendations -Close monitoring of her blood pressures and increase labetalol dosage as needed. -An appointment was made for her to return in 4 weeks for fetal growth assessment. -Weekly BPP from [redacted] weeks gestation till delivery. -Continue follow-up with ID physicians.  Continue antiretroviral medications. -If viral load is undetectable or less than thousand copies/mL, she can have vaginal delivery. -Zidovudine intrapartum prophylaxis.  Thank you for consultation.  If you have any questions or concerns, please contact me the [facility].  Consultation including face-to-face (more than 50%) counseling 45 minutes.

## 2023-03-02 DIAGNOSIS — O139 Gestational [pregnancy-induced] hypertension without significant proteinuria, unspecified trimester: Secondary | ICD-10-CM | POA: Diagnosis not present

## 2023-03-05 DIAGNOSIS — O139 Gestational [pregnancy-induced] hypertension without significant proteinuria, unspecified trimester: Secondary | ICD-10-CM | POA: Diagnosis not present

## 2023-03-05 DIAGNOSIS — O09522 Supervision of elderly multigravida, second trimester: Secondary | ICD-10-CM | POA: Diagnosis not present

## 2023-03-05 DIAGNOSIS — Z3A25 25 weeks gestation of pregnancy: Secondary | ICD-10-CM | POA: Diagnosis not present

## 2023-03-12 DIAGNOSIS — O10012 Pre-existing essential hypertension complicating pregnancy, second trimester: Secondary | ICD-10-CM | POA: Diagnosis not present

## 2023-03-12 DIAGNOSIS — Z3A26 26 weeks gestation of pregnancy: Secondary | ICD-10-CM | POA: Diagnosis not present

## 2023-03-13 ENCOUNTER — Other Ambulatory Visit: Payer: Self-pay | Admitting: Family Medicine

## 2023-03-17 DIAGNOSIS — Z3A27 27 weeks gestation of pregnancy: Secondary | ICD-10-CM | POA: Diagnosis not present

## 2023-03-17 DIAGNOSIS — O139 Gestational [pregnancy-induced] hypertension without significant proteinuria, unspecified trimester: Secondary | ICD-10-CM | POA: Diagnosis not present

## 2023-03-17 DIAGNOSIS — O09521 Supervision of elderly multigravida, first trimester: Secondary | ICD-10-CM | POA: Diagnosis not present

## 2023-03-23 DIAGNOSIS — O99210 Obesity complicating pregnancy, unspecified trimester: Secondary | ICD-10-CM | POA: Insufficient documentation

## 2023-03-23 DIAGNOSIS — O43199 Other malformation of placenta, unspecified trimester: Secondary | ICD-10-CM | POA: Insufficient documentation

## 2023-03-23 DIAGNOSIS — O09299 Supervision of pregnancy with other poor reproductive or obstetric history, unspecified trimester: Secondary | ICD-10-CM | POA: Insufficient documentation

## 2023-03-23 DIAGNOSIS — O09529 Supervision of elderly multigravida, unspecified trimester: Secondary | ICD-10-CM | POA: Insufficient documentation

## 2023-03-23 DIAGNOSIS — O09899 Supervision of other high risk pregnancies, unspecified trimester: Secondary | ICD-10-CM | POA: Insufficient documentation

## 2023-03-26 DIAGNOSIS — Z3A28 28 weeks gestation of pregnancy: Secondary | ICD-10-CM | POA: Diagnosis not present

## 2023-03-26 DIAGNOSIS — O43129 Velamentous insertion of umbilical cord, unspecified trimester: Secondary | ICD-10-CM | POA: Diagnosis not present

## 2023-03-26 DIAGNOSIS — Z369 Encounter for antenatal screening, unspecified: Secondary | ICD-10-CM | POA: Diagnosis not present

## 2023-03-27 ENCOUNTER — Other Ambulatory Visit: Payer: Self-pay | Admitting: *Deleted

## 2023-03-27 ENCOUNTER — Ambulatory Visit: Payer: BC Managed Care – PPO | Attending: Obstetrics and Gynecology

## 2023-03-27 ENCOUNTER — Ambulatory Visit: Payer: BC Managed Care – PPO | Admitting: *Deleted

## 2023-03-27 VITALS — BP 142/90 | HR 81

## 2023-03-27 DIAGNOSIS — O09899 Supervision of other high risk pregnancies, unspecified trimester: Secondary | ICD-10-CM | POA: Insufficient documentation

## 2023-03-27 DIAGNOSIS — O99213 Obesity complicating pregnancy, third trimester: Secondary | ICD-10-CM | POA: Diagnosis not present

## 2023-03-27 DIAGNOSIS — O98713 Human immunodeficiency virus [HIV] disease complicating pregnancy, third trimester: Secondary | ICD-10-CM

## 2023-03-27 DIAGNOSIS — O10013 Pre-existing essential hypertension complicating pregnancy, third trimester: Secondary | ICD-10-CM

## 2023-03-27 DIAGNOSIS — O43199 Other malformation of placenta, unspecified trimester: Secondary | ICD-10-CM | POA: Insufficient documentation

## 2023-03-27 DIAGNOSIS — O09293 Supervision of pregnancy with other poor reproductive or obstetric history, third trimester: Secondary | ICD-10-CM

## 2023-03-27 DIAGNOSIS — O09523 Supervision of elderly multigravida, third trimester: Secondary | ICD-10-CM

## 2023-03-27 DIAGNOSIS — O09213 Supervision of pregnancy with history of pre-term labor, third trimester: Secondary | ICD-10-CM

## 2023-03-27 DIAGNOSIS — O09299 Supervision of pregnancy with other poor reproductive or obstetric history, unspecified trimester: Secondary | ICD-10-CM | POA: Diagnosis not present

## 2023-03-27 DIAGNOSIS — B20 Human immunodeficiency virus [HIV] disease: Secondary | ICD-10-CM | POA: Diagnosis not present

## 2023-03-27 DIAGNOSIS — O10913 Unspecified pre-existing hypertension complicating pregnancy, third trimester: Secondary | ICD-10-CM

## 2023-03-27 DIAGNOSIS — O10912 Unspecified pre-existing hypertension complicating pregnancy, second trimester: Secondary | ICD-10-CM | POA: Insufficient documentation

## 2023-03-27 DIAGNOSIS — Z3A28 28 weeks gestation of pregnancy: Secondary | ICD-10-CM

## 2023-03-27 IMAGING — US US ABDOMEN LIMITED
1 series · 14 of 25 positions shown · non-contrast
Comparison: None.

CLINICAL DATA: Right upper quadrant pain

Nausea
EXAM:
ULTRASOUND ABDOMEN LIMITED RIGHT UPPER QUADRANT

[Series 1: us abdomen limited ruq (liver/gb) · 14 of 54 slices shown]
[im 1/54]
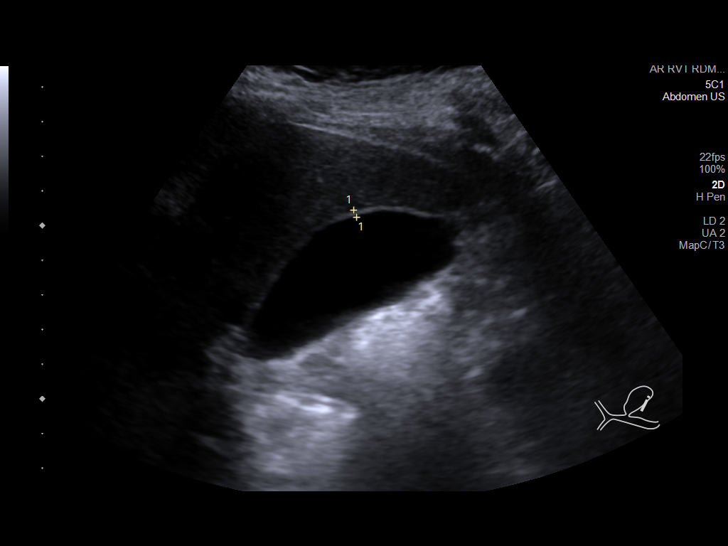
[im 5/54]
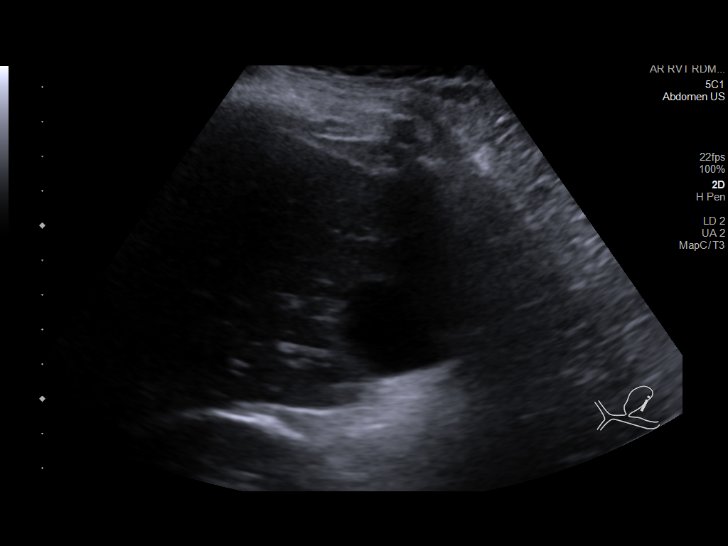
[im 9/54]
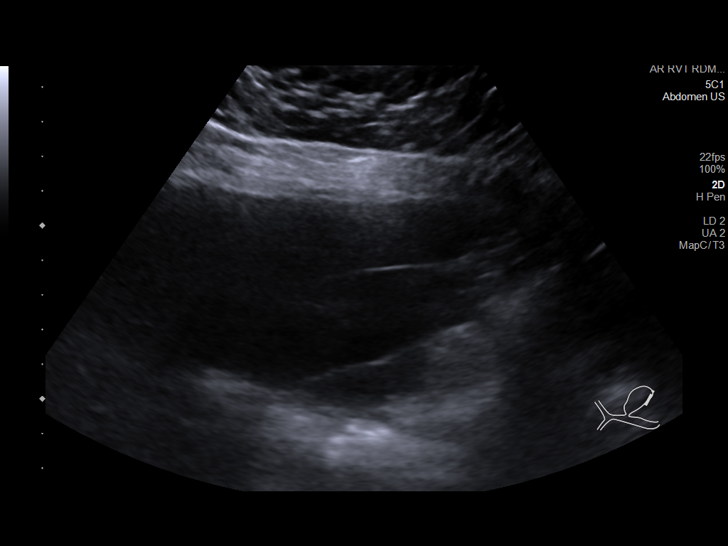
[im 14/54]
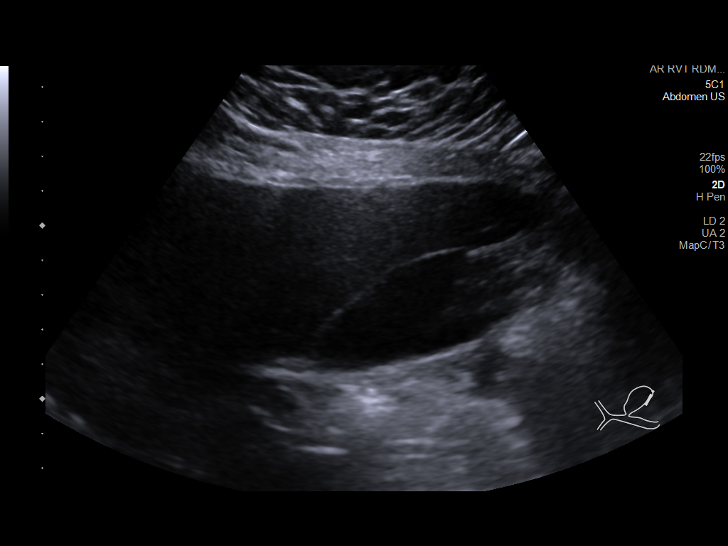
[im 18/54]
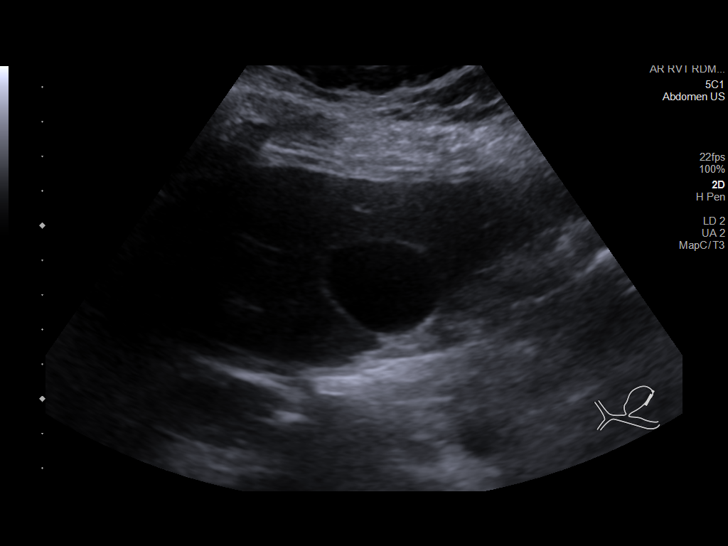
[im 20/54]
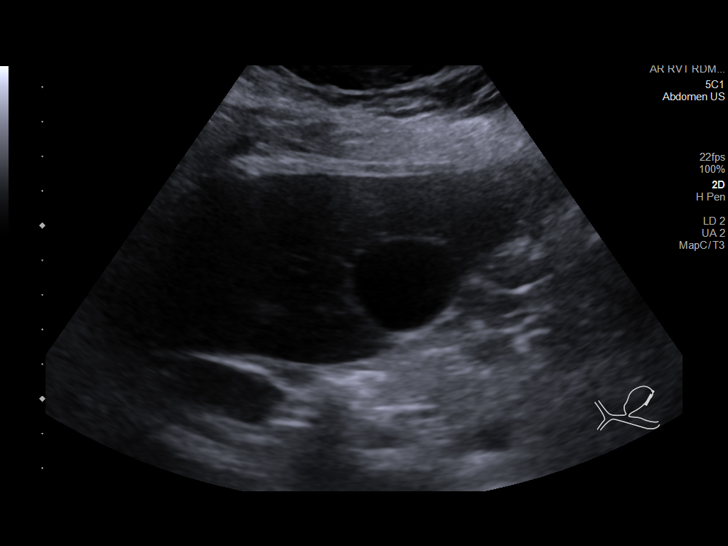
[im 25/54]
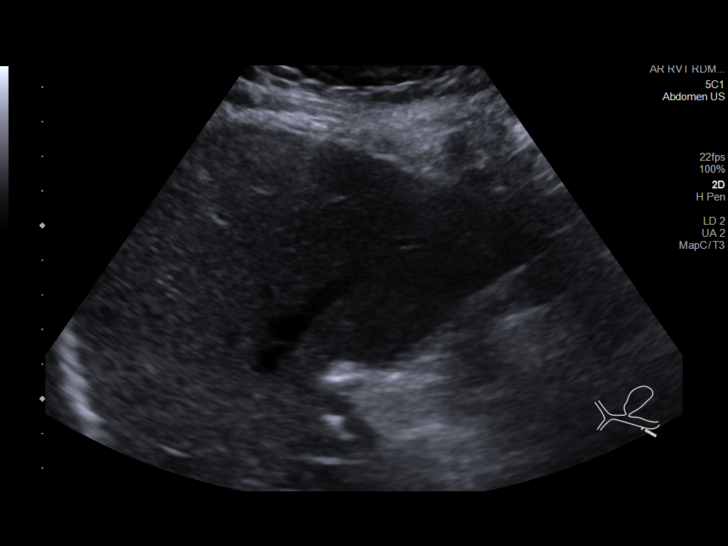
[im 29/54]
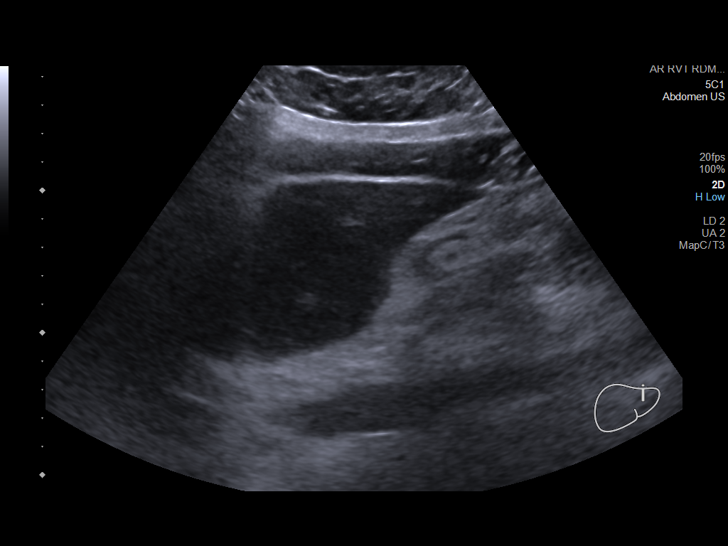
[im 34/54]
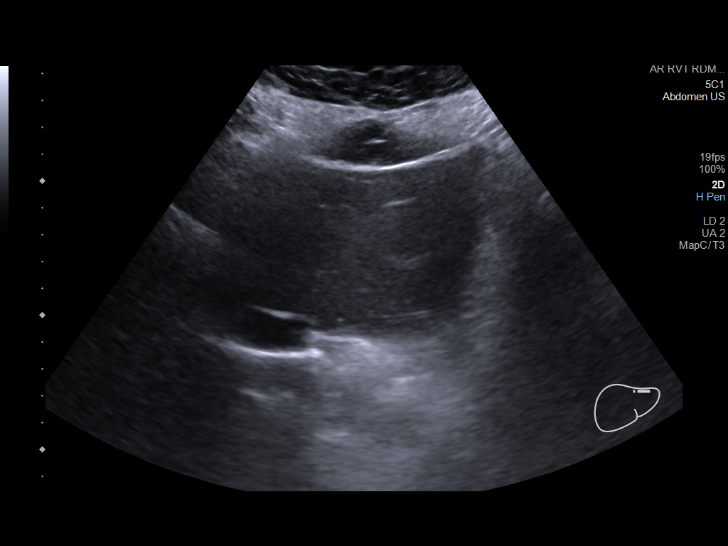
[im 36/54]
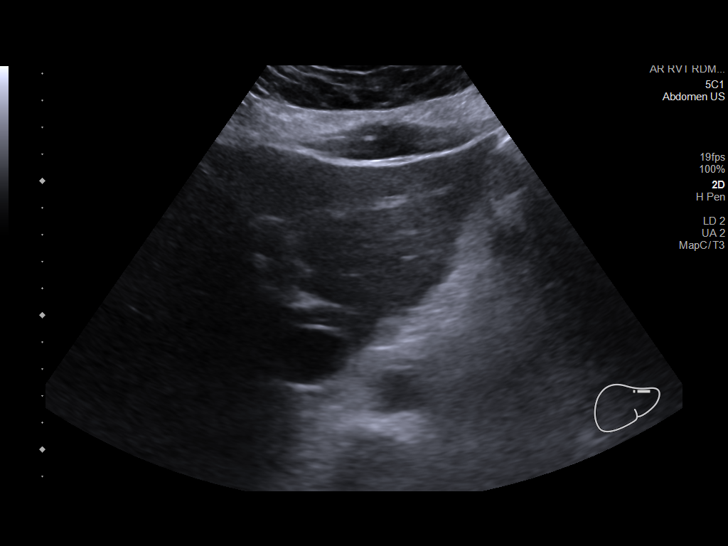
[im 40/54]
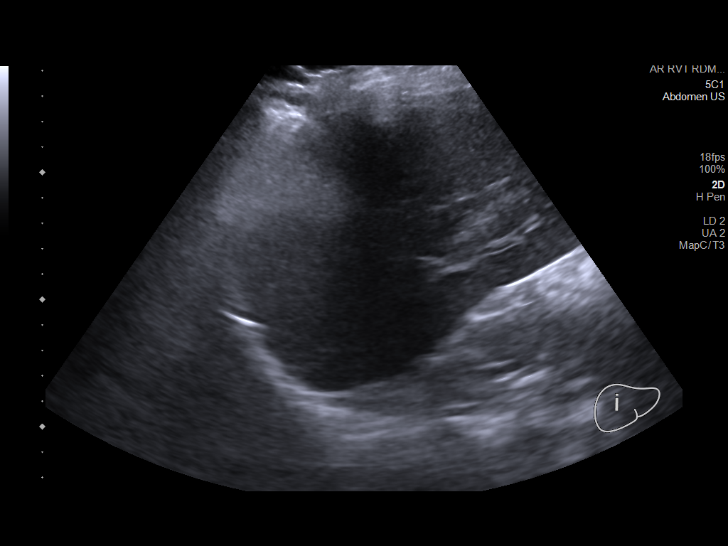
[im 45/54]
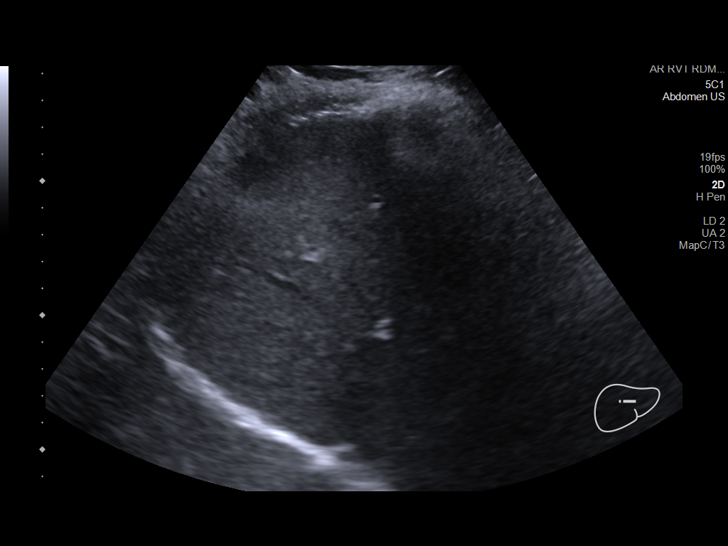
[im 49/54]
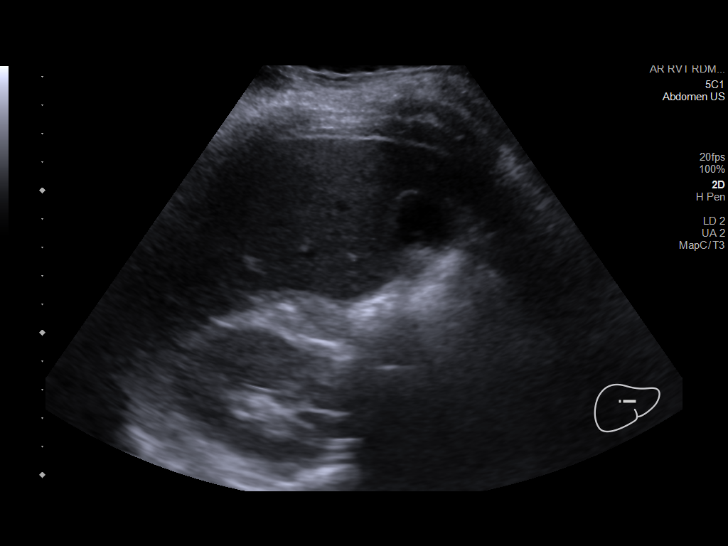
[im 54/54]
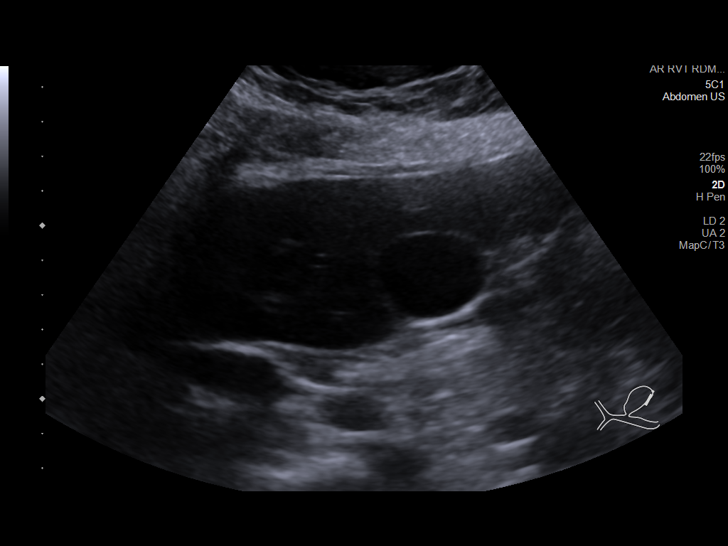

[14 of 25 positions shown; findings below may reference images not displayed]

FINDINGS: Gallbladder:

No gallstones or wall thickening visualized. No sonographic Murphy
sign noted by sonographer.

Common bile duct:

Diameter: 4 mm

Liver:

No focal lesion identified. Within normal limits in parenchymal
echogenicity. Portal vein is patent on color Doppler imaging with
normal direction of blood flow towards the liver.

Other: None.
IMPRESSION: No significant sonographic abnormality of the liver or gallbladder

## 2023-04-01 DIAGNOSIS — Z364 Encounter for antenatal screening for fetal growth retardation: Secondary | ICD-10-CM | POA: Diagnosis not present

## 2023-04-01 DIAGNOSIS — O139 Gestational [pregnancy-induced] hypertension without significant proteinuria, unspecified trimester: Secondary | ICD-10-CM | POA: Diagnosis not present

## 2023-04-01 DIAGNOSIS — Z3A29 29 weeks gestation of pregnancy: Secondary | ICD-10-CM | POA: Diagnosis not present

## 2023-04-03 DIAGNOSIS — I87309 Chronic venous hypertension (idiopathic) without complications of unspecified lower extremity: Secondary | ICD-10-CM | POA: Diagnosis not present

## 2023-04-06 DIAGNOSIS — O99810 Abnormal glucose complicating pregnancy: Secondary | ICD-10-CM | POA: Diagnosis not present

## 2023-04-08 DIAGNOSIS — Z3A30 30 weeks gestation of pregnancy: Secondary | ICD-10-CM | POA: Diagnosis not present

## 2023-04-08 DIAGNOSIS — Z364 Encounter for antenatal screening for fetal growth retardation: Secondary | ICD-10-CM | POA: Diagnosis not present

## 2023-04-08 DIAGNOSIS — O10019 Pre-existing essential hypertension complicating pregnancy, unspecified trimester: Secondary | ICD-10-CM | POA: Diagnosis not present

## 2023-04-15 DIAGNOSIS — Z3A31 31 weeks gestation of pregnancy: Secondary | ICD-10-CM | POA: Diagnosis not present

## 2023-04-15 DIAGNOSIS — Z364 Encounter for antenatal screening for fetal growth retardation: Secondary | ICD-10-CM | POA: Diagnosis not present

## 2023-04-22 DIAGNOSIS — O139 Gestational [pregnancy-induced] hypertension without significant proteinuria, unspecified trimester: Secondary | ICD-10-CM | POA: Diagnosis not present

## 2023-04-22 DIAGNOSIS — Z364 Encounter for antenatal screening for fetal growth retardation: Secondary | ICD-10-CM | POA: Diagnosis not present

## 2023-04-22 DIAGNOSIS — Z3A32 32 weeks gestation of pregnancy: Secondary | ICD-10-CM | POA: Diagnosis not present

## 2023-04-24 ENCOUNTER — Ambulatory Visit: Payer: BC Managed Care – PPO | Admitting: *Deleted

## 2023-04-24 ENCOUNTER — Ambulatory Visit: Payer: BC Managed Care – PPO | Attending: Maternal & Fetal Medicine

## 2023-04-24 ENCOUNTER — Encounter: Payer: Self-pay | Admitting: *Deleted

## 2023-04-24 DIAGNOSIS — O99213 Obesity complicating pregnancy, third trimester: Secondary | ICD-10-CM | POA: Insufficient documentation

## 2023-04-24 DIAGNOSIS — O09213 Supervision of pregnancy with history of pre-term labor, third trimester: Secondary | ICD-10-CM

## 2023-04-24 DIAGNOSIS — O09523 Supervision of elderly multigravida, third trimester: Secondary | ICD-10-CM | POA: Diagnosis not present

## 2023-04-24 DIAGNOSIS — B20 Human immunodeficiency virus [HIV] disease: Secondary | ICD-10-CM

## 2023-04-24 DIAGNOSIS — O10013 Pre-existing essential hypertension complicating pregnancy, third trimester: Secondary | ICD-10-CM

## 2023-04-24 DIAGNOSIS — O98713 Human immunodeficiency virus [HIV] disease complicating pregnancy, third trimester: Secondary | ICD-10-CM | POA: Insufficient documentation

## 2023-04-24 DIAGNOSIS — O24410 Gestational diabetes mellitus in pregnancy, diet controlled: Secondary | ICD-10-CM | POA: Diagnosis not present

## 2023-04-24 DIAGNOSIS — Z3A32 32 weeks gestation of pregnancy: Secondary | ICD-10-CM

## 2023-04-24 DIAGNOSIS — O10913 Unspecified pre-existing hypertension complicating pregnancy, third trimester: Secondary | ICD-10-CM

## 2023-04-24 DIAGNOSIS — O09293 Supervision of pregnancy with other poor reproductive or obstetric history, third trimester: Secondary | ICD-10-CM

## 2023-04-29 DIAGNOSIS — Z3A33 33 weeks gestation of pregnancy: Secondary | ICD-10-CM | POA: Diagnosis not present

## 2023-04-29 DIAGNOSIS — I87309 Chronic venous hypertension (idiopathic) without complications of unspecified lower extremity: Secondary | ICD-10-CM | POA: Diagnosis not present

## 2023-05-05 DIAGNOSIS — Z3A34 34 weeks gestation of pregnancy: Secondary | ICD-10-CM | POA: Diagnosis not present

## 2023-05-05 DIAGNOSIS — O43129 Velamentous insertion of umbilical cord, unspecified trimester: Secondary | ICD-10-CM | POA: Diagnosis not present

## 2023-05-05 DIAGNOSIS — I87309 Chronic venous hypertension (idiopathic) without complications of unspecified lower extremity: Secondary | ICD-10-CM | POA: Diagnosis not present

## 2023-05-07 DIAGNOSIS — Z3A34 34 weeks gestation of pregnancy: Secondary | ICD-10-CM | POA: Diagnosis not present

## 2023-05-07 DIAGNOSIS — O09521 Supervision of elderly multigravida, first trimester: Secondary | ICD-10-CM | POA: Diagnosis not present

## 2023-05-13 ENCOUNTER — Encounter (HOSPITAL_COMMUNITY): Payer: Self-pay | Admitting: Obstetrics & Gynecology

## 2023-05-13 ENCOUNTER — Inpatient Hospital Stay (HOSPITAL_COMMUNITY)
Admission: AD | Admit: 2023-05-13 | Discharge: 2023-05-13 | Disposition: A | Discharge status: Home / Self Care | Payer: BC Managed Care – PPO | Attending: Obstetrics & Gynecology | Admitting: Obstetrics & Gynecology

## 2023-05-13 DIAGNOSIS — O26893 Other specified pregnancy related conditions, third trimester: Secondary | ICD-10-CM | POA: Diagnosis not present

## 2023-05-13 DIAGNOSIS — O99513 Diseases of the respiratory system complicating pregnancy, third trimester: Secondary | ICD-10-CM | POA: Diagnosis not present

## 2023-05-13 DIAGNOSIS — Z3A35 35 weeks gestation of pregnancy: Secondary | ICD-10-CM | POA: Diagnosis not present

## 2023-05-13 DIAGNOSIS — O10913 Unspecified pre-existing hypertension complicating pregnancy, third trimester: Secondary | ICD-10-CM | POA: Diagnosis not present

## 2023-05-13 DIAGNOSIS — O99613 Diseases of the digestive system complicating pregnancy, third trimester: Secondary | ICD-10-CM | POA: Insufficient documentation

## 2023-05-13 DIAGNOSIS — O09523 Supervision of elderly multigravida, third trimester: Secondary | ICD-10-CM | POA: Insufficient documentation

## 2023-05-13 DIAGNOSIS — O98513 Other viral diseases complicating pregnancy, third trimester: Secondary | ICD-10-CM | POA: Insufficient documentation

## 2023-05-13 DIAGNOSIS — O10919 Unspecified pre-existing hypertension complicating pregnancy, unspecified trimester: Secondary | ICD-10-CM

## 2023-05-13 DIAGNOSIS — R030 Elevated blood-pressure reading, without diagnosis of hypertension: Secondary | ICD-10-CM | POA: Insufficient documentation

## 2023-05-13 DIAGNOSIS — Z364 Encounter for antenatal screening for fetal growth retardation: Secondary | ICD-10-CM | POA: Diagnosis not present

## 2023-05-13 LAB — URINALYSIS, ROUTINE W REFLEX MICROSCOPIC
Bilirubin Urine: NEGATIVE
Glucose, UA: NEGATIVE mg/dL
Hgb urine dipstick: NEGATIVE
Ketones, ur: NEGATIVE mg/dL
Nitrite: NEGATIVE
Protein, ur: NEGATIVE mg/dL
Specific Gravity, Urine: 1.014 (ref 1.005–1.030)
pH: 7 (ref 5.0–8.0)

## 2023-05-13 LAB — CBC
HCT: 36 % (ref 36.0–46.0)
Hemoglobin: 12.3 g/dL (ref 12.0–15.0)
MCH: 30.2 pg (ref 26.0–34.0)
MCHC: 34.2 g/dL (ref 30.0–36.0)
MCV: 88.5 fL (ref 80.0–100.0)
Platelets: 265 10*3/uL (ref 150–400)
RBC: 4.07 MIL/uL (ref 3.87–5.11)
RDW: 13 % (ref 11.5–15.5)
WBC: 11.8 10*3/uL — ABNORMAL HIGH (ref 4.0–10.5)
nRBC: 0 % (ref 0.0–0.2)

## 2023-05-13 LAB — COMPREHENSIVE METABOLIC PANEL
ALT: 14 U/L (ref 0–44)
AST: 16 U/L (ref 15–41)
Albumin: 2.9 g/dL — ABNORMAL LOW (ref 3.5–5.0)
Alkaline Phosphatase: 126 U/L (ref 38–126)
Anion gap: 12 (ref 5–15)
BUN: 15 mg/dL (ref 6–20)
CO2: 20 mmol/L — ABNORMAL LOW (ref 22–32)
Calcium: 9.6 mg/dL (ref 8.9–10.3)
Chloride: 99 mmol/L (ref 98–111)
Creatinine, Ser: 0.96 mg/dL (ref 0.44–1.00)
GFR, Estimated: >60 mL/min (ref >60)
Glucose, Bld: 90 mg/dL (ref 70–99)
Potassium: 3.5 mmol/L (ref 3.5–5.1)
Sodium: 131 mmol/L — ABNORMAL LOW (ref 135–145)
Total Bilirubin: 0.4 mg/dL (ref 0.3–1.2)
Total Protein: 7.6 g/dL (ref 6.5–8.1)

## 2023-05-13 LAB — PROTEIN / CREATININE RATIO, URINE
Creatinine, Urine: 92 mg/dL
Protein Creatinine Ratio: 0.15 mg/mg{Cre} (ref 0.00–0.15)
Total Protein, Urine: 14 mg/dL

## 2023-05-13 MED ORDER — HYDRALAZINE HCL 20 MG/ML IJ SOLN
5.0000 mg | INTRAMUSCULAR | Status: DC | PRN
  Administered 2023-05-13: 5 mg via INTRAVENOUS
  Filled 2023-05-13: qty 1

## 2023-05-13 MED ORDER — NIFEDIPINE ER OSMOTIC RELEASE 60 MG PO TB24: 60.0000 mg | ORAL_TABLET | Freq: Two times a day (BID) | ORAL | 2 refills | Status: DC

## 2023-05-13 MED ORDER — LABETALOL HCL 5 MG/ML IV SOLN: 40.0000 mg | INTRAVENOUS | Status: DC | PRN

## 2023-05-13 MED ORDER — HYDROCHLOROTHIAZIDE 25 MG PO TABS: 25.0000 mg | ORAL_TABLET | Freq: Two times a day (BID) | ORAL | 2 refills | Status: DC

## 2023-05-13 MED ORDER — LABETALOL HCL 5 MG/ML IV SOLN: 20.0000 mg | INTRAVENOUS | Status: DC | PRN

## 2023-05-13 MED ORDER — HYDRALAZINE HCL 20 MG/ML IJ SOLN: 10.0000 mg | INTRAMUSCULAR | Status: DC | PRN

## 2023-05-13 NOTE — MAU Note (Signed)
RN called lab to follow-up about  protein creatinine ratio after speaking to lab twice and sending down lab requisition . Lab tech, Barbara Marshall, answered and informed RN that  they would begin processing now .

## 2023-05-13 NOTE — MAU Note (Signed)
RN called lab to inquire about  protein-creatinine ratio add-on . Lab tech answered and informed RN that they would locate the sample and begin processing. Lab requisition sent.

## 2023-05-13 NOTE — MAU Note (Signed)
.  Barbara Marshall is a 37 y.o. at [redacted]w[redacted]d here in MAU reporting: sent over from [facility] office for HBP of 170/104.   PIH Assessment: Headache present: No  Visual disturbances: None RUQ pain/Epigastric: None Atypical edema: None Hx of HBP: CHTN and Hx of Pre-E in previous pregnancy BP Medications: Nifedipine (Procardia) 120  mg and Hydralazine 25 mg   Vaginal Bleeding Vag. Bleeding: None  Abnormal discharge/LOF: Membranes Sac Identifier: Sac 1 Membrane Status: Intact Amount: None and Amount: None  Fetal Movement: Reports positive FM  LMP: No LMP recorded (lmp unknown). Patient is pregnant. Pain score: Denies pain       Vitals:   05/13/23 1703  BP: (!) 162/106  Pulse: (!) 109  Resp: 20  Temp: 98.2 F (36.8 C)  SpO2: 97%      FHT: Fetal Heart Rate Mode: External Baseline Rate (A): 165 bpm  OB Office: [facility] Lab orders placed from triage: Urinalysis

## 2023-05-13 NOTE — MAU Provider Note (Signed)
History     CSN: [id number]  Arrival date and time: 05/13/23 1640   Event Date/Time   First Provider Initiated Contact with Patient 05/13/23 1705      Chief Complaint  Patient presents with   Hypertension   HPI  Barbara Marshall is a 37 y.o. G2P0102 at [redacted]w[redacted]d who presents for evaluation of elevated blood pressures. Patient reports she went to the office today and had severe range BPs. She has chronic hypertension and takes both Procardia 120mg  and HCTZ 50mg  at 0830 in the morning. She reports feeling very stressed today.   She denies any HA, visual changes or epigastric pain.    She denies any vaginal bleeding, discharge, and leaking of fluid. Denies any constipation, diarrhea or any urinary complaints. Reports normal fetal movement.   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   Mild intermittent asthma without complication 09/14/2018   Plantar fasciitis    Plantar fasciitis, bilateral 10/29/2021   Pregnancy induced hypertension    Seasonal and perennial allergic rhinitis 09/14/2018   Trichomonas infection     Past Surgical History:  Procedure Laterality Date   WISDOM TOOTH EXTRACTION      Family History  Problem Relation Age of Onset   Cancer Father        BRAIN   Diabetes Father    Asthma Father    Thyroid disease Mother    Migraines Mother    Allergic rhinitis Mother    Eczema Neg Hx    Urticaria Neg Hx     Social History   Tobacco Use   Smoking status: Never   Smokeless tobacco: Never  Vaping Use   Vaping Use: Never used  Substance Use Topics    Alcohol use: Not Currently    Comment: SOCIAL rare once year   Drug use: No    Allergies:  Allergies  Allergen Reactions   Citalopram Other (See Comments)    Insomnia and restless legs    No medications prior to admission.    Review of Systems  Constitutional: Negative.  Negative for fatigue and fever.  HENT: Negative.    Respiratory: Negative.  Negative for shortness of breath.   Cardiovascular: Negative.  Negative for chest pain.  Gastrointestinal: Negative.  Negative for abdominal pain, constipation, diarrhea, nausea and vomiting.  Genitourinary: Negative.  Negative for dysuria, vaginal bleeding and vaginal discharge.  Neurological: Negative.  Negative for dizziness and headaches.   Physical Exam   Blood pressure 128/87, pulse 92, temperature 98.2 F (36.8 C), temperature source Oral, resp. rate 20, height 5\' 3"  (1.6 m), weight 91 kg, SpO2 100 %.  Patient Vitals for the past 24 hrs:  BP Temp Temp src Pulse Resp SpO2 Height Weight  05/13/23 1915 128/87 -- -- 92 -- 100 % -- --  05/13/23 1910 -- -- -- -- -- 99 % -- --  05/13/23 1905 -- -- -- -- -- 100 % -- --  05/13/23 1900 (!) 141/101 -- -- (!) 113 -- 100 % -- --  05/13/23 1855 -- -- -- -- -- 100 % -- --  05/13/23 1850 -- -- -- -- -- 100 % -- --  05/13/23 1845 (!) 148/98 -- -- (!) 113 -- 99 % -- --  05/13/23 1840 -- -- -- -- -- 100 % -- --  05/13/23 1835 -- -- -- -- -- 100 % -- --  05/13/23 1830 (!) 139/103 -- -- (!) 114 -- 99 % -- --  05/13/23 1825 -- -- -- -- -- 99 % -- --  05/13/23 1815 (!) 145/100 -- -- (!) 114 -- 99 % -- --  05/13/23 1810 -- -- -- -- -- 100 % -- --  05/13/23 1805 -- -- -- -- -- 100 % -- --  05/13/23 1800 (!) 149/95 -- -- (!) 108 -- 100 % -- --  05/13/23 1755 -- -- -- -- -- 100 % -- --  05/13/23 1750 -- -- -- -- -- 99 % -- --  05/13/23 1745 (!) 154/105 -- -- (!) 122 -- 100 % -- --  05/13/23 1740 -- -- -- -- -- 100 % -- --  05/13/23 1735 -- -- -- -- -- 100 % -- --  05/13/23 1730 (!) 155/100  -- -- (!) 124 -- 100 % -- --  05/13/23 1725 -- -- -- -- -- 100 % -- --  05/13/23 1720 -- -- -- -- -- 99 % -- --  05/13/23 1715 (!) 166/109 -- -- (!) 124 -- 99 % -- --  05/13/23 1710 -- -- -- -- -- 99 % -- --  05/13/23 1705 -- -- -- -- -- 98 % -- --  05/13/23 1703 (!) 162/106 98.2 F (36.8 C) Oral (!) 109 20 97 % -- --  05/13/23 1700 -- -- -- -- -- 99 % -- --  05/13/23 1653 -- -- -- -- -- -- 5\' 3"  (1.6 m) 91 kg    Physical Exam Vitals and nursing note reviewed.  Constitutional:      General: She is not in acute distress.    Appearance: She is well-developed.  HENT:     Head: Normocephalic.  Eyes:     Pupils: Pupils are equal, round, and reactive to light.  Cardiovascular:     Rate and Rhythm: Normal rate and regular rhythm.     Heart sounds: Normal heart sounds.  Pulmonary:     Effort: Pulmonary effort is normal. No respiratory distress.     Breath sounds: Normal breath sounds.  Abdominal:     General: Bowel sounds are normal. There is no distension.     Palpations: Abdomen is soft.     Tenderness: There is no abdominal tenderness.  Skin:    General: Skin is warm and dry.  Neurological:     Mental Status: She is alert and oriented to person, place, and time.     Motor: No abnormal muscle tone.     Coordination: Coordination normal.     Deep Tendon Reflexes: Reflexes are normal and symmetric. Reflexes normal.  Psychiatric:        Behavior: Behavior normal.        Thought Content: Thought content normal.        Judgment: Judgment normal.  Fetal Tracing:  Baseline: 140 Variability: moderate Accels: 15x15 Decels: none  Toco: occasional uc's  MAU Course  Procedures  Results for orders placed or performed during the hospital encounter of 05/13/23 (from the past 24 hour(s))  Urinalysis, Routine w reflex microscopic -Urine, Clean Catch     Status: Abnormal   Collection Time: 05/13/23  4:54 PM  Result Value Ref Range   Color, Urine YELLOW YELLOW   APPearance  HAZY (A) CLEAR   Specific Gravity, Urine 1.014 1.005 - 1.030   pH 7.0 5.0 - 8.0   Glucose, UA NEGATIVE NEGATIVE mg/dL   Hgb urine dipstick NEGATIVE NEGATIVE   Bilirubin Urine NEGATIVE NEGATIVE   Ketones, ur NEGATIVE NEGATIVE mg/dL   Protein, ur NEGATIVE NEGATIVE mg/dL   Nitrite NEGATIVE NEGATIVE   Leukocytes,Ua MODERATE (A) NEGATIVE   RBC / HPF 0-5 0 - 5 RBC/hpf   WBC, UA 11-20 0 - 5 WBC/hpf   Bacteria, UA RARE (A) NONE SEEN   Squamous Epithelial / HPF 11-20 0 - 5 /HPF   Mucus PRESENT   Protein / creatinine ratio, urine     Status: None   Collection Time: 05/13/23  4:54 PM  Result Value Ref Range   Creatinine, Urine 92 mg/dL   Total Protein, Urine 14 mg/dL   Protein Creatinine Ratio 0.15 0.00 - 0.15 mg/mg[Cre]  CBC     Status: Abnormal   Collection Time: 05/13/23  5:14 PM  Result Value Ref Range   WBC 11.8 (H) 4.0 - 10.5 K/uL   RBC 4.07 3.87 - 5.11 MIL/uL   Hemoglobin 12.3 12.0 - 15.0 g/dL   HCT 16.1 09.6 - 04.5 %   MCV 88.5 80.0 - 100.0 fL   MCH 30.2 26.0 - 34.0 pg   MCHC 34.2 30.0 - 36.0 g/dL   RDW 40.9 81.1 - 91.4 %   Platelets 265 150 - 400 K/uL   nRBC 0.0 0.0 - 0.2 %  Comprehensive metabolic panel     Status: Abnormal   Collection Time: 05/13/23  5:14 PM  Result Value Ref Range   Sodium 131 (L) 135 - 145 mmol/L   Potassium 3.5 3.5 - 5.1 mmol/L   Chloride 99 98 - 111 mmol/L   CO2 20 (L) 22 - 32 mmol/L   Glucose, Bld 90 70 - 99 mg/dL   BUN 15 6 - 20 mg/dL   Creatinine, Ser 7.82 0.44 - 1.00 mg/dL   Calcium 9.6 8.9 - 95.6 mg/dL   Total Protein 7.6 6.5 - 8.1 g/dL   Albumin 2.9 (L) 3.5 - 5.0 g/dL   AST 16 15 - 41 U/L   ALT 14 0 - 44 U/L   Alkaline Phosphatase 126 38 - 126 U/L   Total Bilirubin 0.4 0.3 - 1.2 mg/dL   GFR, Estimated >21 >30 mL/min   Anion gap 12 5 - 15      MDM Prenatal records from community office reviewed until early April, no recent records available. Pregnancy complicated by [facility] ordered and reviewed.   UA CBC, CMP, Protein/creat  ratio Preeclapmsia focused orderset  CNM consulted with Dr. Alysia Penna regarding presentation and results- MD recommends changing current BP regimen to Procardia 60mg  BID and HCTZ 25mg  BID  Strict preeclampsia precautions reviewed.  Assessment and Plan   1. Chronic hypertension affecting pregnancy   2. [redacted] weeks gestation of pregnancy     -Discharge home in stable condition -Preeclampsia precautions discussed -Patient advised to follow-up with OB as scheduled for prenatal  care -Patient may return to MAU as needed or if her condition were to change or worsen  Rolm Bookbinder, CNM 05/13/2023, 5:05 PM

## 2023-05-13 NOTE — MAU Note (Signed)
RN called lab to inquire about  protein creatine ratio . Lab tech answered and informed RN. The lab tech informed RN that the sample was not received in and RN informed lab that when she called on the third time they said that they were going to receive the sample and begin processing. Lab tech said that this had not occurred, but that she would locate the sample and receive it in now.

## 2023-05-13 NOTE — MAU Note (Signed)
RN called lab to inquire about CBC and Comprehensive Metabolic Panel. Lab tech answered and informed RN that that they have received sample and will process now.Marland Kitchen

## 2023-05-20 DIAGNOSIS — O24419 Gestational diabetes mellitus in pregnancy, unspecified control: Secondary | ICD-10-CM | POA: Insufficient documentation

## 2023-05-21 DIAGNOSIS — Z3A36 36 weeks gestation of pregnancy: Secondary | ICD-10-CM | POA: Diagnosis not present

## 2023-05-21 DIAGNOSIS — Z364 Encounter for antenatal screening for fetal growth retardation: Secondary | ICD-10-CM | POA: Diagnosis not present

## 2023-05-21 DIAGNOSIS — O09523 Supervision of elderly multigravida, third trimester: Secondary | ICD-10-CM | POA: Diagnosis not present

## 2023-05-22 ENCOUNTER — Ambulatory Visit: Payer: BC Managed Care – PPO

## 2023-05-22 ENCOUNTER — Other Ambulatory Visit: Payer: Self-pay | Admitting: Obstetrics and Gynecology

## 2023-05-22 ENCOUNTER — Ambulatory Visit: Payer: BC Managed Care – PPO | Attending: Maternal & Fetal Medicine

## 2023-05-22 ENCOUNTER — Ambulatory Visit: Payer: BC Managed Care – PPO | Admitting: *Deleted

## 2023-05-22 VITALS — BP 137/91 | HR 143

## 2023-05-22 DIAGNOSIS — O09213 Supervision of pregnancy with history of pre-term labor, third trimester: Secondary | ICD-10-CM

## 2023-05-22 DIAGNOSIS — O98713 Human immunodeficiency virus [HIV] disease complicating pregnancy, third trimester: Secondary | ICD-10-CM | POA: Insufficient documentation

## 2023-05-22 DIAGNOSIS — O10913 Unspecified pre-existing hypertension complicating pregnancy, third trimester: Secondary | ICD-10-CM | POA: Insufficient documentation

## 2023-05-22 DIAGNOSIS — O99213 Obesity complicating pregnancy, third trimester: Secondary | ICD-10-CM

## 2023-05-22 DIAGNOSIS — O10013 Pre-existing essential hypertension complicating pregnancy, third trimester: Secondary | ICD-10-CM

## 2023-05-22 DIAGNOSIS — B20 Human immunodeficiency virus [HIV] disease: Secondary | ICD-10-CM | POA: Diagnosis not present

## 2023-05-22 DIAGNOSIS — O09523 Supervision of elderly multigravida, third trimester: Secondary | ICD-10-CM | POA: Diagnosis not present

## 2023-05-22 DIAGNOSIS — O09293 Supervision of pregnancy with other poor reproductive or obstetric history, third trimester: Secondary | ICD-10-CM

## 2023-05-22 DIAGNOSIS — Z3A36 36 weeks gestation of pregnancy: Secondary | ICD-10-CM

## 2023-05-25 DIAGNOSIS — Z3A00 Weeks of gestation of pregnancy not specified: Secondary | ICD-10-CM | POA: Diagnosis not present

## 2023-05-25 DIAGNOSIS — O98719 Human immunodeficiency virus [HIV] disease complicating pregnancy, unspecified trimester: Secondary | ICD-10-CM | POA: Diagnosis not present

## 2023-05-26 ENCOUNTER — Telehealth (HOSPITAL_COMMUNITY): Payer: Self-pay | Admitting: *Deleted

## 2023-05-26 ENCOUNTER — Encounter (HOSPITAL_COMMUNITY): Payer: Self-pay | Admitting: *Deleted

## 2023-05-26 NOTE — Telephone Encounter (Signed)
Preadmission screen  

## 2023-05-27 DIAGNOSIS — Z3A37 37 weeks gestation of pregnancy: Secondary | ICD-10-CM | POA: Diagnosis not present

## 2023-05-27 DIAGNOSIS — Z3A35 35 weeks gestation of pregnancy: Secondary | ICD-10-CM | POA: Diagnosis not present

## 2023-05-27 DIAGNOSIS — B20 Human immunodeficiency virus [HIV] disease: Secondary | ICD-10-CM | POA: Diagnosis not present

## 2023-05-27 DIAGNOSIS — Z364 Encounter for antenatal screening for fetal growth retardation: Secondary | ICD-10-CM | POA: Diagnosis not present

## 2023-05-27 DIAGNOSIS — O98719 Human immunodeficiency virus [HIV] disease complicating pregnancy, unspecified trimester: Secondary | ICD-10-CM | POA: Diagnosis not present

## 2023-05-27 DIAGNOSIS — Z3A00 Weeks of gestation of pregnancy not specified: Secondary | ICD-10-CM | POA: Diagnosis not present

## 2023-06-01 ENCOUNTER — Inpatient Hospital Stay (HOSPITAL_COMMUNITY)
Admission: AD | Admit: 2023-06-01 | Discharge: 2023-06-03 | DRG: 806 | Disposition: A | Discharge status: Home / Self Care | Payer: BC Managed Care – PPO | Attending: Obstetrics and Gynecology | Admitting: Obstetrics and Gynecology

## 2023-06-01 ENCOUNTER — Inpatient Hospital Stay (HOSPITAL_COMMUNITY): Payer: BC Managed Care – PPO | Admitting: Anesthesiology

## 2023-06-01 ENCOUNTER — Other Ambulatory Visit: Payer: Self-pay

## 2023-06-01 ENCOUNTER — Inpatient Hospital Stay (HOSPITAL_COMMUNITY)
Admission: RE | Admit: 2023-06-01 | Discharge: 2023-06-01 | Disposition: A | Discharge status: Home / Self Care | Payer: BC Managed Care – PPO | Source: Ambulatory Visit | Attending: Family Medicine | Admitting: Family Medicine

## 2023-06-01 DIAGNOSIS — O99214 Obesity complicating childbirth: Secondary | ICD-10-CM | POA: Diagnosis present

## 2023-06-01 DIAGNOSIS — O1092 Unspecified pre-existing hypertension complicating childbirth: Principal | ICD-10-CM | POA: Diagnosis present

## 2023-06-01 DIAGNOSIS — O09513 Supervision of elderly primigravida, third trimester: Secondary | ICD-10-CM | POA: Diagnosis not present

## 2023-06-01 DIAGNOSIS — Z7982 Long term (current) use of aspirin: Secondary | ICD-10-CM

## 2023-06-01 DIAGNOSIS — O09523 Supervision of elderly multigravida, third trimester: Secondary | ICD-10-CM | POA: Diagnosis present

## 2023-06-01 DIAGNOSIS — O9832 Other infections with a predominantly sexual mode of transmission complicating childbirth: Secondary | ICD-10-CM | POA: Diagnosis not present

## 2023-06-01 DIAGNOSIS — O99824 Streptococcus B carrier state complicating childbirth: Secondary | ICD-10-CM | POA: Diagnosis not present

## 2023-06-01 DIAGNOSIS — Z3A38 38 weeks gestation of pregnancy: Secondary | ICD-10-CM | POA: Diagnosis not present

## 2023-06-01 DIAGNOSIS — A6000 Herpesviral infection of urogenital system, unspecified: Secondary | ICD-10-CM | POA: Diagnosis present

## 2023-06-01 DIAGNOSIS — Z21 Asymptomatic human immunodeficiency virus [HIV] infection status: Secondary | ICD-10-CM | POA: Diagnosis present

## 2023-06-01 DIAGNOSIS — O24420 Gestational diabetes mellitus in childbirth, diet controlled: Secondary | ICD-10-CM | POA: Diagnosis present

## 2023-06-01 DIAGNOSIS — O9872 Human immunodeficiency virus [HIV] disease complicating childbirth: Secondary | ICD-10-CM | POA: Diagnosis not present

## 2023-06-01 DIAGNOSIS — O10919 Unspecified pre-existing hypertension complicating pregnancy, unspecified trimester: Secondary | ICD-10-CM | POA: Diagnosis present

## 2023-06-01 DIAGNOSIS — O43123 Velamentous insertion of umbilical cord, third trimester: Secondary | ICD-10-CM | POA: Diagnosis present

## 2023-06-01 DIAGNOSIS — Z79899 Other long term (current) drug therapy: Secondary | ICD-10-CM

## 2023-06-01 DIAGNOSIS — Z3A37 37 weeks gestation of pregnancy: Secondary | ICD-10-CM

## 2023-06-01 LAB — GLUCOSE, CAPILLARY
Glucose-Capillary: 134 mg/dL — ABNORMAL HIGH (ref 70–99)
Glucose-Capillary: 96 mg/dL (ref 70–99)
Glucose-Capillary: 94 mg/dL (ref 70–99)
Glucose-Capillary: 97 mg/dL (ref 70–99)

## 2023-06-01 LAB — CBC
HCT: 34.5 % — ABNORMAL LOW (ref 36.0–46.0)
HCT: 30.4 % — ABNORMAL LOW (ref 36.0–46.0)
Hemoglobin: 12 g/dL (ref 12.0–15.0)
Hemoglobin: 10.5 g/dL — ABNORMAL LOW (ref 12.0–15.0)
MCH: 29.9 pg (ref 26.0–34.0)
MCH: 30.3 pg (ref 26.0–34.0)
MCHC: 34.8 g/dL (ref 30.0–36.0)
MCHC: 34.5 g/dL (ref 30.0–36.0)
MCV: 86 fL (ref 80.0–100.0)
MCV: 87.9 fL (ref 80.0–100.0)
Platelets: 300 10*3/uL (ref 150–400)
Platelets: 247 10*3/uL (ref 150–400)
RBC: 4.01 MIL/uL (ref 3.87–5.11)
RBC: 3.46 MIL/uL — ABNORMAL LOW (ref 3.87–5.11)
RDW: 14.1 % (ref 11.5–15.5)
RDW: 14.2 % (ref 11.5–15.5)
WBC: 10.4 10*3/uL (ref 4.0–10.5)
WBC: 10.6 10*3/uL — ABNORMAL HIGH (ref 4.0–10.5)
nRBC: 0 % (ref 0.0–0.2)
nRBC: 0 % (ref 0.0–0.2)

## 2023-06-01 LAB — TYPE AND SCREEN
ABO/RH(D): O POS
Antibody Screen: NEGATIVE

## 2023-06-01 LAB — RPR: RPR Ser Ql: NONREACTIVE

## 2023-06-01 MED ORDER — ACETAMINOPHEN 325 MG PO TABS: 650.0000 mg | ORAL_TABLET | ORAL | Status: DC | PRN

## 2023-06-01 MED ORDER — NIFEDIPINE ER OSMOTIC RELEASE 30 MG PO TB24
60.0000 mg | ORAL_TABLET | Freq: Two times a day (BID) | ORAL | Status: DC
  Administered 2023-06-01 – 2023-06-03 (×4): 60 mg via ORAL
  Filled 2023-06-01 (×4): qty 2

## 2023-06-01 MED ORDER — OXYTOCIN BOLUS FROM INFUSION
333.0000 mL | Freq: Once | INTRAVENOUS | Status: AC
  Administered 2023-06-02: 333 mL via INTRAVENOUS

## 2023-06-01 MED ORDER — CALCIUM CARBONATE ANTACID 500 MG PO CHEW
2.0000 | CHEWABLE_TABLET | Freq: Once | ORAL | Status: AC
  Administered 2023-06-01: 400 mg via ORAL
  Filled 2023-06-01: qty 2

## 2023-06-01 MED ORDER — LACTATED RINGERS IV SOLN: INTRAVENOUS | Status: DC

## 2023-06-01 MED ORDER — LACTATED RINGERS IV SOLN: 500.0000 mL | Freq: Once | INTRAVENOUS | Status: AC

## 2023-06-01 MED ORDER — FENTANYL-BUPIVACAINE-NACL 0.5-0.125-0.9 MG/250ML-% EP SOLN
12.0000 mL/h | EPIDURAL | Status: DC | PRN
  Administered 2023-06-01: 12 mL/h via EPIDURAL
  Filled 2023-06-01: qty 250

## 2023-06-01 MED ORDER — FLEET ENEMA 7-19 GM/118ML RE ENEM: 1.0000 | ENEMA | RECTAL | Status: DC | PRN

## 2023-06-01 MED ORDER — TERBUTALINE SULFATE 1 MG/ML IJ SOLN: 0.2500 mg | Freq: Once | INTRAMUSCULAR | Status: DC | PRN

## 2023-06-01 MED ORDER — OXYTOCIN-SODIUM CHLORIDE 30-0.9 UT/500ML-% IV SOLN
2.5000 [IU]/h | INTRAVENOUS | Status: DC
  Filled 2023-06-01 (×2): qty 500

## 2023-06-01 MED ORDER — ONDANSETRON HCL 4 MG/2ML IJ SOLN: 4.0000 mg | Freq: Four times a day (QID) | INTRAMUSCULAR | Status: DC | PRN

## 2023-06-01 MED ORDER — LORATADINE 10 MG PO TABS
10.0000 mg | ORAL_TABLET | Freq: Every day | ORAL | Status: DC
  Administered 2023-06-01 – 2023-06-03 (×3): 10 mg via ORAL
  Filled 2023-06-01 (×3): qty 1

## 2023-06-01 MED ORDER — SOD CITRATE-CITRIC ACID 500-334 MG/5ML PO SOLN: 30.0000 mL | ORAL | Status: DC | PRN

## 2023-06-01 MED ORDER — EPHEDRINE 5 MG/ML INJ: 10.0000 mg | INTRAVENOUS | Status: DC | PRN

## 2023-06-01 MED ORDER — DIPHENHYDRAMINE HCL 50 MG/ML IJ SOLN: 12.5000 mg | INTRAMUSCULAR | Status: DC | PRN

## 2023-06-01 MED ORDER — SODIUM CHLORIDE 0.9 % IV SOLN
5.0000 10*6.[IU] | Freq: Once | INTRAVENOUS | Status: AC
  Administered 2023-06-01: 5 10*6.[IU] via INTRAVENOUS
  Filled 2023-06-01: qty 5

## 2023-06-01 MED ORDER — PHENYLEPHRINE 80 MCG/ML (10ML) SYRINGE FOR IV PUSH (FOR BLOOD PRESSURE SUPPORT)
80.0000 ug | PREFILLED_SYRINGE | INTRAVENOUS | Status: DC | PRN
  Filled 2023-06-01: qty 10

## 2023-06-01 MED ORDER — OXYTOCIN-SODIUM CHLORIDE 30-0.9 UT/500ML-% IV SOLN
1.0000 m[IU]/min | INTRAVENOUS | Status: DC
  Administered 2023-06-01: 2 m[IU]/min via INTRAVENOUS

## 2023-06-01 MED ORDER — FENTANYL CITRATE (PF) 100 MCG/2ML IJ SOLN
50.0000 ug | INTRAMUSCULAR | Status: DC | PRN
  Administered 2023-06-01: 100 ug via INTRAVENOUS
  Filled 2023-06-01: qty 2

## 2023-06-01 MED ORDER — LIDOCAINE HCL (PF) 1 % IJ SOLN: 30.0000 mL | INTRAMUSCULAR | Status: DC | PRN

## 2023-06-01 MED ORDER — PENICILLIN G POT IN DEXTROSE 60000 UNIT/ML IV SOLN
3.0000 10*6.[IU] | INTRAVENOUS | Status: DC
  Administered 2023-06-01 (×3): 3 10*6.[IU] via INTRAVENOUS
  Filled 2023-06-01 (×3): qty 50

## 2023-06-01 MED ORDER — LIDOCAINE HCL (PF) 1 % IJ SOLN
INTRAMUSCULAR | Status: DC | PRN
  Administered 2023-06-01: 4 mL via EPIDURAL
  Administered 2023-06-01: 5 mL via EPIDURAL

## 2023-06-01 MED ORDER — LACTATED RINGERS IV SOLN: 500.0000 mL | INTRAVENOUS | Status: DC | PRN

## 2023-06-01 MED ORDER — PHENYLEPHRINE 80 MCG/ML (10ML) SYRINGE FOR IV PUSH (FOR BLOOD PRESSURE SUPPORT): 80.0000 ug | PREFILLED_SYRINGE | INTRAVENOUS | Status: DC | PRN

## 2023-06-01 NOTE — Anesthesia Preprocedure Evaluation (Addendum)
Anesthesia Evaluation  Patient identified by MRN, date of birth, ID band Patient awake    Reviewed: Allergy & Precautions, NPO status , Patient's Chart, lab work & pertinent test results  History of Anesthesia Complications Negative for: history of anesthetic complications  Airway Mallampati: II   Neck ROM: Full    Dental   Pulmonary asthma    Pulmonary exam normal        Cardiovascular hypertension, Pt. on medications Normal cardiovascular exam     Neuro/Psych  PSYCHIATRIC DISORDERS Anxiety Depression    negative neurological ROS     GI/Hepatic negative GI ROS, Neg liver ROS,,,  Endo/Other  diabetes, Gestational    Renal/GU negative Renal ROS     Musculoskeletal negative musculoskeletal ROS (+)    Abdominal   Peds  Hematology  (+) Blood dyscrasia, anemia , HIV Plt 247k    Anesthesia Other Findings HSV  Reproductive/Obstetrics (+) Pregnancy                             Anesthesia Physical Anesthesia Plan  ASA: 2  Anesthesia Plan: Epidural   Post-op Pain Management:    Induction:   PONV Risk Score and Plan: 2 and Treatment may vary due to age or medical condition  Airway Management Planned: Natural Airway  Additional Equipment: None  Intra-op Plan:   Post-operative Plan:   Informed Consent: I have reviewed the patients History and Physical, chart, labs and discussed the procedure including the risks, benefits and alternatives for the proposed anesthesia with the patient or authorized representative who has indicated his/her understanding and acceptance.       Plan Discussed with: Anesthesiologist  Anesthesia Plan Comments: (Labs reviewed. Platelets acceptable, patient not taking any blood thinning medications. Per RN, FHR tracing reported to be stable enough for sitting procedure. Risks and benefits discussed with patient, including PDPH, backache, epidural hematoma,  failed epidural, blood pressure changes, allergic reaction, and nerve injury. Patient expressed understanding and wished to proceed.)        Anesthesia Quick Evaluation

## 2023-06-01 NOTE — Anesthesia Procedure Notes (Signed)
Epidural Patient location during procedure: OB Start time: 06/01/2023 9:24 PM End time: 06/01/2023 9:28 PM  Staffing Anesthesiologist: Beryle Lathe, MD Performed: anesthesiologist   Preanesthetic Checklist Completed: patient identified, IV checked, risks and benefits discussed, monitors and equipment checked, pre-op evaluation and timeout performed  Epidural Patient position: sitting Prep: DuraPrep Patient monitoring: continuous pulse ox and blood pressure Approach: midline Location: L2-L3 Injection technique: LOR saline  Needle:  Needle type: Tuohy  Needle gauge: 17 G Needle length: 9 cm Needle insertion depth: 5 cm Catheter size: 19 Gauge Catheter at skin depth: 10 cm Test dose: negative and Other (1% lidocaine)  Assessment Events: blood not aspirated and no cerebrospinal fluid  Additional Notes Patient identified. Risks including, but not limited to, bleeding, infection, nerve damage, paralysis, inadequate analgesia, blood pressure changes, nausea, vomiting, allergic reaction, postpartum back pain, itching, and headache were discussed. Patient expressed understanding and wished to proceed. Sterile prep and drape, including hand hygiene, mask, and sterile gloves were used. The patient was positioned and the spine was prepped. The skin was anesthetized with lidocaine. No paraesthesia or other complication noted. The patient did not experience any signs of intravascular injection such as tinnitus or metallic taste in mouth, nor signs of intrathecal spread such as rapid motor block. Please see nursing notes for vital signs. The patient tolerated the procedure well.   Leslye Peer, MDReason for block:procedure for pain

## 2023-06-01 NOTE — Progress Notes (Addendum)
Subjective:    Coping well, perceives contractions as mild. Discussed pain management options and answered questions.   Objective:    VS: BP 129/77   Pulse 79   Temp 98.2 F (36.8 C) (Oral)   Resp 16   LMP  (LMP Unknown)  FHR : baseline 135 / variability moderate / accelerations present / absent decelerations Toco: contractions every 2-5 minutes  Membranes: intact Dilation: 4 Effacement (%): 80 Station: -2 Presentation: Vertex Exam by:: Rhea Pink, CNM Pitocin 12 mU/min CBG (last 3)  Recent Labs    06/01/23 1014 06/01/23 1434  GLUCAP 134* 96     Assessment/Plan:   37 y.o. G2P0102 [redacted]w[redacted]d IOL for CHTN  Labor: Progressing on Pitocin, will continue to increase then AROM GHTN:  no signs or symptoms of toxicity Fetal Wellbeing:  Category I Pain Control:   aware of all options  I/D:   GBS pos, PCN x2 doses received Anticipated MOD:  NSVD  Roma Schanz DNP, CNM 06/01/2023 3:45 PM

## 2023-06-01 NOTE — H&P (Signed)
OB ADMISSION/ HISTORY & PHYSICAL:  Admission Date: 06/01/2023  6:27 AM  Admit Diagnosis: CHTN affecting pregnancy  Barbara Marshall is a 37 y.o. female G2P0102 [redacted]w[redacted]d presenting for IOL. Marland Kitchen Endorses active FM, denies LOF and vaginal bleeding. Pregnancy complicated by HIV positive status with undetectable RVA during the pregnancy, most recent lab was undetectable on 05/27/23. CHTN managed on Procardia 60 mg PO BID and hydrochlorothiazide 25 daily, A1DM, marginal cord insertion, and hx of pre-eclampsia w/ severe features.   Hx of preterm ROM and preterm labor with twins @ 33 wks.    History of current pregnancy: G2P0102   Patient entered care with [facility] at 19 wks.   EDC 06/16/23 by [place] @ 13+2 wk U/S.   Anatomy scan:  23+4 wks, complete w/ anterior placenta.   Antenatal testing: for CHTN, A1DM, AMA started at 32 weeks Last evaluation:  36+1  wks cephalic/ anterior placenta/ BPP 8/8. AFI 17/ EFW 6+3 (39%) BPP 8/8 @ 37+1 wks  Significant prenatal events:  Patient Active Problem List   Diagnosis Date Noted   Chronic hypertension affecting pregnancy 06/01/2023   GDM (gestational diabetes mellitus) 05/20/2023   Obesity affecting pregnancy 03/23/2023   History of preterm delivery, currently pregnant 03/23/2023   Maternal age 92+, multigravida, antepartum 11/30/2022   History of severe pre-eclampsia 11/30/2022   Chronic hypertension in pregnancy 06/13/2019   Genital herpes simplex 12/14/2018   Human immunodeficiency virus infection (HCC) 01/09/2018   Pregnancy 01/07/2018   Hypovitaminosis D 02/04/2017   Anxiety 11/29/2016   Human immunodeficiency virus (HIV) disease (HCC) 11/29/2016   Seasonal allergies 07/25/2015    Prenatal Labs: ABO, Rh: --/--/O POS (06/24 0654) Antibody: NEG (06/24 0654) Rubella: Immune (02/19 0000)  RPR: Nonreactive (02/19 0000)  HBsAg: Negative (02/19 0000)  HIV: Reactive (02/19 0000)  GTT: abnormal 3 hr GBS:   positive GC/CHL: neg/neg Genetics:  declined Vaccines: Tdap: declined Influenza: declined   OB History  Gravida Para Term Preterm AB Living  2 1   1   2   SAB IAB Ectopic Multiple Live Births        1 2    # Outcome Date GA Lbr Len/2nd Weight Sex Delivery Anes PTL Lv  2 Current           1A Preterm 07/26/18 [redacted]w[redacted]d 02:55 / 00:17 1760 g M Vag-Spont EPI  LIV  1B Preterm 07/26/18 [redacted]w[redacted]d 02:55 / 00:28 2010 g M Vag-Vacuum EPI  LIV    Medical / Surgical History: Past medical history:  Past Medical History:  Diagnosis Date   Anxiety    Anxiety and depression 10/21/2019   Asthma    Cholelithiasis    Dysmenorrhea    H/O   Gestational diabetes    Heart murmur    History of IBS    History of pre-eclampsia    HIV infection (HCC)    HSV infection    Insomnia secondary to depression with anxiety 04/29/2022   Mild intermittent asthma without complication 09/14/2018   Plantar fasciitis    Plantar fasciitis, bilateral 10/29/2021   Pregnancy induced hypertension    Seasonal and perennial allergic rhinitis 09/14/2018   Trichomonas infection     Past surgical history:  Past Surgical History:  Procedure Laterality Date   WISDOM TOOTH EXTRACTION     Family History:  Family History  Problem Relation Age of Onset   Cancer Father        BRAIN   Diabetes Father    Asthma Father  Thyroid disease Mother    Migraines Mother    Allergic rhinitis Mother    Eczema Neg Hx    Urticaria Neg Hx     Social History:  reports that she has never smoked. She has never used smokeless tobacco. She reports that she does not currently use alcohol. She reports that she does not use drugs.  Allergies: Citalopram   Current Medications at time of admission:  Prior to Admission medications   Medication Sig Start Date End Date Taking? Authorizing Provider  abacavir-dolutegravir-lamiVUDine (TRIUMEQ) 600-50-300 MG tablet TAKE 1 TABLET BY MOUTH EVERY DAY 11/24/18  Yes [provider]  hydrochlorothiazide (HYDRODIURIL) 25 MG  tablet Take 1 tablet (25 mg total) by mouth 2 (two) times daily. 05/13/23  Yes Rolm Bookbinder, CNM  NIFEdipine (PROCARDIA XL) 60 MG 24 hr tablet Take 1 tablet (60 mg total) by mouth in the morning and at bedtime. 05/13/23  Yes Rolm Bookbinder, CNM  Prenatal Vit-Fe Fumarate-FA (MULTIVITAMIN-PRENATAL) 27-0.8 MG TABS tablet Take 1 tablet by mouth daily at 12 noon.   Yes [provider]  albuterol (VENTOLIN HFA) 108 (90 Base) MCG/ACT inhaler Inhale 2 puffs into the lungs every 4 (four) hours as needed for wheezing or shortness of breath. 11/03/22   Particia Nearing, PA-C  aspirin 81 MG chewable tablet Chew by mouth daily.    [provider]  cetirizine (ZYRTEC) 10 MG tablet Take 1 tablet (10 mg total) by mouth daily. 04/01/22   Leath-Warren, Sadie Haber, NP  fluticasone (FLONASE) 50 MCG/ACT nasal spray Place 2 sprays into both nostrils daily. 04/01/22   Leath-Warren, Sadie Haber, NP  gabapentin (NEURONTIN) 300 MG capsule TAKE 1 TO 2 TABLETS BY MOUTH AT BEDTIME 08/28/22   Tommie Sams, DO    Review of Systems: Constitutional: Negative   HENT: Negative   Eyes: Negative   Respiratory: Negative   Cardiovascular: Negative   Gastrointestinal: Negative  Genitourinary: scant bloody show, neg for LOF   Musculoskeletal: Negative   Skin: Negative   Neurological: Negative   Endo/Heme/Allergies: Negative   Psychiatric/Behavioral: Negative    Physical Exam: VS: There were no vitals taken for this visit. AAO x3, no signs of distress Cardiovascular: RRR Respiratory: Unlabored GU/GI: Abdomen gravid, non-tender, non-distended, active FM, vertex,  Extremities: no edema, negative for pain, tenderness, and cords  Cervical exam:Dilation: 2 Effacement (%): 50 Station: -3 Exam by:: Rhea Pink, CNM FHR: baseline rate 135 / variability moderate / accelerations present / absent decelerations TOCO: 2-5 w/ periods of irregularity   Prenatal Transfer Tool  Maternal Diabetes: Yes:   Diabetes Type:  Diet controlled Genetic Screening: Normal Maternal Ultrasounds/Referrals: Normal Fetal Ultrasounds or other Referrals:  Referred to Materal Fetal Medicine  Maternal Substance Abuse:  No Significant Maternal Medications:  Meds include: Other: Procardia, HCTZ Significant Maternal Lab Results: Group B Strep positive and HIV positive Number of Prenatal Visits:greater than 3 verified prenatal visits Other Comments:   HIV positive mother, undetectable viral load this pregnancy    Assessment: 37 y.o. [id number] [redacted]w[redacted]d IOL for   HIV     -HIV infection in pregnancy. Patient is being followed by ID physician     -viral RNA is undetectable on 05/27/23     -abacavir-dolutegravir-lamiVUDine (TRIUMEQ) 600-50-300 MG tablet      -patient does not have AIDS CHTN    -continue procardia 60 mg BID    -last PCR 0.15 on 05/13/23    -hold HCTZ    -no S/S of pre-e,  continue to monitor HSV    -no prodromal symptoms    -negative sterile spec exam    -prophylaxis since A1DM    -CBG Q 4 hrs FHR category 1    -Marginal cord insertion  GBS positive    -PCN Q4 hrs Pain management plan: per pt's request   Plan:  Admit to L&D Routine admission orders Epidural PRN  Dr Normand Sloop notified of admission and plan of care  Roma Schanz DNP, CNM 06/01/2023 10:20 AM

## 2023-06-01 NOTE — Progress Notes (Signed)
Subjective:    Continues to perceive contractions as cramps. Considering having an epidural. Discussed amniotomy and pt agrees.   Objective:    VS: BP 130/75   Pulse 82   Temp 98.2 F (36.8 C) (Oral)   Resp 16   LMP  (LMP Unknown)  FHR : baseline 135 / variability moderate / accelerations present / absent decelerations Toco: contractions every 2-5 minutes  Membranes: AROM, copious clear fluid Dilation: 5 Effacement (%): 80 Station: -2 Presentation: Vertex Exam by:: Rhea Pink, RN Pitocin 18, reduced to 18 mU/min CBG (last 3)  Recent Labs    06/01/23 1014 06/01/23 1434 06/01/23 1804  GLUCAP 134* 96 94    Assessment/Plan:   36 y.o. G2P0102 [redacted]w[redacted]d  IOL for CHTN   Labor: Progressing on Pitocin and amniotomy GHTN:  no signs or symptoms of toxicity, normotensive values Fetal Wellbeing:  Category I Pain Control:   aware of all options  I/D:   GBS pos, PCN x2 doses received Anticipated MOD:  NSVD  Roma Schanz DNP, CNM 06/01/2023 8:51 PM

## 2023-06-02 ENCOUNTER — Encounter (HOSPITAL_COMMUNITY): Payer: Self-pay | Admitting: Obstetrics and Gynecology

## 2023-06-02 LAB — CBC
HCT: 31 % — ABNORMAL LOW (ref 36.0–46.0)
Hemoglobin: 10.6 g/dL — ABNORMAL LOW (ref 12.0–15.0)
MCH: 30.1 pg (ref 26.0–34.0)
MCHC: 34.2 g/dL (ref 30.0–36.0)
MCV: 88.1 fL (ref 80.0–100.0)
Platelets: 236 10*3/uL (ref 150–400)
RBC: 3.52 MIL/uL — ABNORMAL LOW (ref 3.87–5.11)
RDW: 14.2 % (ref 11.5–15.5)
WBC: 14.7 10*3/uL — ABNORMAL HIGH (ref 4.0–10.5)
nRBC: 0 % (ref 0.0–0.2)

## 2023-06-02 MED ORDER — PRENATAL MULTIVITAMIN CH
1.0000 | ORAL_TABLET | Freq: Every day | ORAL | Status: DC
  Administered 2023-06-03: 1 via ORAL
  Filled 2023-06-02: qty 1

## 2023-06-02 MED ORDER — MAGNESIUM OXIDE -MG SUPPLEMENT 400 (240 MG) MG PO TABS
400.0000 mg | ORAL_TABLET | Freq: Every day | ORAL | Status: DC
  Administered 2023-06-02: 400 mg via ORAL
  Filled 2023-06-02: qty 1

## 2023-06-02 MED ORDER — DIPHENHYDRAMINE HCL 25 MG PO CAPS: 25.0000 mg | ORAL_CAPSULE | Freq: Four times a day (QID) | ORAL | Status: DC | PRN

## 2023-06-02 MED ORDER — COCONUT OIL OIL: 1.0000 | TOPICAL_OIL | Status: DC | PRN

## 2023-06-02 MED ORDER — SENNOSIDES-DOCUSATE SODIUM 8.6-50 MG PO TABS
2.0000 | ORAL_TABLET | ORAL | Status: DC
  Administered 2023-06-02 – 2023-06-03 (×2): 2 via ORAL
  Filled 2023-06-02 (×2): qty 2

## 2023-06-02 MED ORDER — POLYSACCHARIDE IRON COMPLEX 150 MG PO CAPS
150.0000 mg | ORAL_CAPSULE | Freq: Every day | ORAL | Status: DC
  Administered 2023-06-02 – 2023-06-03 (×2): 150 mg via ORAL
  Filled 2023-06-02 (×2): qty 1

## 2023-06-02 MED ORDER — BENZOCAINE-MENTHOL 20-0.5 % EX AERO
1.0000 | INHALATION_SPRAY | CUTANEOUS | Status: DC | PRN
  Administered 2023-06-02: 1 via TOPICAL
  Filled 2023-06-02: qty 56

## 2023-06-02 MED ORDER — ONDANSETRON HCL 4 MG PO TABS: 4.0000 mg | ORAL_TABLET | ORAL | Status: DC | PRN

## 2023-06-02 MED ORDER — TETANUS-DIPHTH-ACELL PERTUSSIS 5-2.5-18.5 LF-MCG/0.5 IM SUSY: 0.5000 mL | PREFILLED_SYRINGE | Freq: Once | INTRAMUSCULAR | Status: DC

## 2023-06-02 MED ORDER — IBUPROFEN 600 MG PO TABS
600.0000 mg | ORAL_TABLET | Freq: Four times a day (QID) | ORAL | Status: DC
  Administered 2023-06-02 – 2023-06-03 (×6): 600 mg via ORAL
  Filled 2023-06-02 (×6): qty 1

## 2023-06-02 MED ORDER — DIBUCAINE (PERIANAL) 1 % EX OINT
1.0000 | TOPICAL_OINTMENT | Freq: Four times a day (QID) | CUTANEOUS | Status: DC
  Administered 2023-06-03: 1 via RECTAL
  Filled 2023-06-02: qty 28

## 2023-06-02 MED ORDER — ACETAMINOPHEN 325 MG PO TABS
650.0000 mg | ORAL_TABLET | Freq: Four times a day (QID) | ORAL | Status: DC
  Administered 2023-06-02 – 2023-06-03 (×6): 650 mg via ORAL
  Filled 2023-06-02 (×6): qty 2

## 2023-06-02 MED ORDER — ONDANSETRON HCL 4 MG/2ML IJ SOLN: 4.0000 mg | INTRAMUSCULAR | Status: DC | PRN

## 2023-06-02 MED ORDER — SIMETHICONE 80 MG PO CHEW: 80.0000 mg | CHEWABLE_TABLET | ORAL | Status: DC | PRN

## 2023-06-02 MED ORDER — WITCH HAZEL-GLYCERIN EX PADS
1.0000 | MEDICATED_PAD | Freq: Four times a day (QID) | CUTANEOUS | Status: DC
  Administered 2023-06-03: 1 via TOPICAL

## 2023-06-02 MED ORDER — ABACAVIR-DOLUTEGRAVIR-LAMIVUD 600-50-300 MG PO TABS
1.0000 | ORAL_TABLET | Freq: Every day | ORAL | Status: DC
  Administered 2023-06-02 – 2023-06-03 (×2): 1 via ORAL
  Filled 2023-06-02 (×2): qty 1

## 2023-06-02 NOTE — Progress Notes (Signed)
Just called Rhea Pink, CNM re: elevated BP reading x 2hrs in a row.  Confirmed with [place] that parameters are to call if BP >160/110.  No new orders, patient is already on Procardia.

## 2023-06-02 NOTE — Anesthesia Postprocedure Evaluation (Signed)
Anesthesia Post Note  Patient: Barbara Marshall  Procedure(s) Performed: AN AD HOC LABOR EPIDURAL     Patient location during evaluation: Mother Baby Anesthesia Type: Epidural Level of consciousness: awake, oriented and awake and alert Pain management: pain level controlled Vital Signs Assessment: post-procedure vital signs reviewed and stable Respiratory status: spontaneous breathing, respiratory function stable and nonlabored ventilation Cardiovascular status: stable Postop Assessment: no headache, adequate PO intake, able to ambulate, patient able to bend at knees and no apparent nausea or vomiting Anesthetic complications: no   No notable events documented.  Last Vitals:  Vitals:   06/02/23 0903 06/02/23 1259  BP: 125/85 (!) 129/94  Pulse: 86 81  Resp: 18 19  Temp: 37.3 C 37.2 C  SpO2:  98%    Last Pain:  Vitals:   06/02/23 1259  TempSrc: Oral  PainSc:    Pain Goal:                   [name]

## 2023-06-03 LAB — GLUCOSE, CAPILLARY: Glucose-Capillary: 83 mg/dL (ref 70–99)

## 2023-06-03 MED ORDER — IBUPROFEN 600 MG PO TABS: 600.0000 mg | ORAL_TABLET | Freq: Four times a day (QID) | ORAL | 0 refills | Status: DC

## 2023-06-03 NOTE — Discharge Summary (Signed)
Postpartum Discharge Summary    [name] DOB: 01-23-1986 MRN: [id number]  Date of admission: 06/01/2023 Delivery date:06/02/2023  Delivering provider: Rhea Pink B  Date of discharge: 06/03/2023  Admitting diagnosis: Chronic hypertension affecting pregnancy [O10.919] Intrauterine pregnancy: [redacted]w[redacted]d     Secondary diagnosis:  Principal Problem:   Chronic hypertension affecting pregnancy Active Problems:   AMA (advanced maternal age) multigravida 35+, third trimester  Additional problems: HIV positive.    Discharge diagnosis: Term Pregnancy Delivered and CHTN                                              Post partum procedures: None Augmentation: Pitocin, AROM.  Complications: None  Hospital course: Induction of Labor With Vaginal Delivery   37 y.o. yo [id number] at [redacted]w[redacted]d was admitted to the hospital 06/01/2023 for induction of labor.  Indication for induction:  CHTN, HIV positive with undetectable viral load .  Patient had an labor course that was not complicated.  Membrane Rupture Time/Date: 6:40 PM ,06/01/2023   Delivery Method:Vaginal, Spontaneous  Episiotomy: None  Lacerations:  None  Details of delivery can be found in separate delivery note.  Patient had a uncomplicated postpartum course.  Patient is discharged home 06/03/23.  Newborn Data: Birth date:06/02/2023  Birth time:12:42 AM  Gender:Female  Living status:Living  Apgars:8 ,9  Weight:3000 g    Physical exam  Vitals:   06/02/23 2128 06/02/23 2310 06/03/23 0145 06/03/23 0603  BP: (!) 138/95 (!) 145/95 135/89 (!) 147/85  Pulse: 83 69 70 (!) 56  Resp: 18   18  Temp: 98.1 F (36.7 C)     TempSrc: Oral     SpO2: 99%   98%   General: alert, cooperative, and no distress Lochia: appropriate Uterine Fundus: firm Incision: N/A DVT Evaluation: No evidence of DVT seen on physical exam. Labs: Lab Results  Component Value Date   WBC 14.7 (H) 06/02/2023   HGB 10.6 (L) 06/02/2023   HCT 31.0  (L) 06/02/2023   MCV 88.1 06/02/2023   PLT 236 06/02/2023      Latest Ref Rng & Units 05/13/2023    5:14 PM  CMP  Glucose 70 - 99 mg/dL 90   BUN 6 - 20 mg/dL 15   Creatinine 1.54 - 1.00 mg/dL 0.08   Sodium 676 - 195 mmol/L 131   Potassium 3.5 - 5.1 mmol/L 3.5   Chloride 98 - 111 mmol/L 99   CO2 22 - 32 mmol/L 20   Calcium 8.9 - 10.3 mg/dL 9.6   Total Protein 6.5 - 8.1 g/dL 7.6   Total Bilirubin 0.3 - 1.2 mg/dL 0.4   Alkaline Phos 38 - 126 U/L 126   AST 15 - 41 U/L 16   ALT 0 - 44 U/L 14    Edinburgh Score:    06/02/2023   12:54 PM  Edinburgh Postnatal Depression Scale Screening Tool  I have been able to laugh and see the funny side of things. 0  I have looked forward with enjoyment to things. 0  I have blamed myself unnecessarily when things went wrong. 1  I have been anxious or worried for no good reason. 2  I have felt scared or panicky for no good reason. 1  Things have been getting on top of me. 1  I have been so unhappy that I  have had difficulty sleeping. 1  I have felt sad or miserable. 1  I have been so unhappy that I have been crying. 0  The thought of harming myself has occurred to me. 0  Edinburgh Postnatal Depression Scale Total 7     After visit meds:  Allergies as of 06/03/2023       Reactions   Citalopram Other (See Comments)   Insomnia and restless legs        Medication List     STOP taking these medications    aspirin 81 MG chewable tablet   hydrochlorothiazide 25 MG tablet Commonly known as: HYDRODIURIL       TAKE these medications    albuterol 108 (90 Base) MCG/ACT inhaler Commonly known as: VENTOLIN HFA Inhale 2 puffs into the lungs every 4 (four) hours as needed for wheezing or shortness of breath.   cetirizine 10 MG tablet Commonly known as: ZYRTEC Take 1 tablet (10 mg total) by mouth daily.   fluticasone 50 MCG/ACT nasal spray Commonly known as: FLONASE Place 2 sprays into both nostrils daily.   gabapentin 300 MG  capsule Commonly known as: NEURONTIN TAKE 1 TO 2 TABLETS BY MOUTH AT BEDTIME   ibuprofen 600 MG tablet Commonly known as: ADVIL Take 1 tablet (600 mg total) by mouth every 6 (six) hours.   multivitamin-prenatal 27-0.8 MG Tabs tablet Take 1 tablet by mouth daily at 12 noon.   NIFEdipine 60 MG 24 hr tablet Commonly known as: Procardia XL Take 1 tablet (60 mg total) by mouth in the morning and at bedtime.   Triumeq 600-50-300 MG tablet Generic drug: abacavir-dolutegravir-lamiVUDine TAKE 1 TABLET BY MOUTH EVERY DAY       Discharge home in stable condition Infant Feeding: Bottle Infant Disposition:home with mother Discharge instruction: per After Visit Summary and Postpartum booklet. Activity: Advance as tolerated. Pelvic rest for 6 weeks.  Diet: routine diet Future Appointments:No future appointments. Follow up Visit:  Follow-up Information     [facility], [facility]. Schedule an appointment as soon as possible for a visit in 1 week(s).   Specialty: Obstetrics and Gynecology Why: 1 week office visit for blood pressure check. 6 weeks office visit for postpartum check. Contact information: [address] [place] [address] [phone]                Delivery mode:  Vaginal, Spontaneous  Anticipated Birth Control:  OCPs  06/03/2023 Prescilla Sours, MD

## 2023-06-03 NOTE — Progress Notes (Signed)
1420. Dr. Basilia Jumbo notified of recent BP 133/86 and she states ok for patient to go home. Dr. Sallye Ober was also notified that the NP had told the night RN S Wells that we didn't need to call her unless the BP was greater than 160/110, so that is why we were not calling her if her BPs were greater than 135/95 per floor protocol.

## 2023-06-03 NOTE — Social Work (Signed)
CSW received consult for hx of Anxiety and HIV exposed newborn.  CSW met with MOB to offer support and complete assessment.  CSW entered the room and observed MOB exiting the restroom and the infant in the bassinet. CSW introduced self CSW role and reason for visit, MOB was agreeable to visit. CSW inquired about how MOB was feeling, MOB reported feeling okay just tired. CSW inquired about MOB MH hx, MOB reported she has always dealt with anxiety but does not take any medication or therapy currently. MOB reported her symptoms are manageable. CSW assessed for safety, MOB denied any SI or HI. CSW provided education regarding the baby blues period vs. perinatal mood disorders, discussed treatment and gave resources for mental health follow up if concerns arise.  CSW recommends self-evaluation during the postpartum time period using the New Mom Checklist from Postpartum Progress and encouraged MOB to contact a medical professional if symptoms are noted at any time.  MOB identified FOB and family as her supports.  CSW inquired about Pediatric infectious disease follow up for the infant. MOB reported she wanted to go with [facility], CSW notified MOB that an appointment would be scheduled, MOB verbalized understanding. CSW scheduled appointment for 7/5 @1:40pm. CSW faxed referral from to [facility].  CSW provided review of Sudden Infant Death Syndrome (SIDS) precautions.  MOB identified [facility] for infants follow up care. MOB reported they have all necessary items for the infant including a crib and car seat.  CSW identifies no further need for intervention and no barriers to discharge at this time.  Clair Alfieri, LCSWA Clinical Social Worker [phone] 

## 2023-06-03 NOTE — Discharge Instructions (Signed)
[name], 1. Do not do any heavy lifting, i.e nothing heavier than 30 lbs for the next 6 weeks.  2.  Do not use tampons or douche or take baths, do not have any sexual intercourse or anything inside the vagina for the next 6 weeks.  3. Take your pain medication as needed for pain, let us know if the pain is not well controlled despite pain medication use.  4.  If you get a fever while at home, do check your temperature and if it is equal to or greater than 100.4 please call the office.   5. Some vaginal bleeding is expected and normal after your delivery. Please let us know if if it excessive where you saturate 1 pad in less than 2 hours or so.  6. Please let us know if with depression or anxiety symptoms.    [facility] [phone].

## 2023-06-08 ENCOUNTER — Encounter: Payer: Self-pay | Admitting: Family Medicine

## 2023-06-09 DIAGNOSIS — I87309 Chronic venous hypertension (idiopathic) without complications of unspecified lower extremity: Secondary | ICD-10-CM | POA: Diagnosis not present

## 2023-06-26 ENCOUNTER — Telehealth (HOSPITAL_COMMUNITY): Payer: Self-pay | Admitting: *Deleted

## 2023-06-26 NOTE — Telephone Encounter (Signed)
06/26/2023  Name: JAYLYNN MCALEER MRN: [id number] DOB: 1986/08/22  Reason for Call:  Transition of Care Hospital Discharge Call  Contact Status: Patient Contact Status:  (incomplete)  Patient just leaving house at time of call and requests call back to complete additional questions.  Language assistant needed: Interpreter Mode: Interpreter Not Needed        Follow-Up Questions: Do You Have Any Concerns About Your Health As You Heal From Delivery?: No Do You Have Any Concerns About Your Infants Health?: No  Edinburgh Postnatal Depression Scale:  In the Past 7 Days:      PHQ2-9 Depression Scale:     Discharge Follow-up:    Post-discharge interventions: NA  Salena Saner, RN 7/19 2024 12:26

## 2023-06-27 ENCOUNTER — Emergency Department (HOSPITAL_COMMUNITY): Payer: BC Managed Care – PPO

## 2023-06-27 ENCOUNTER — Encounter (HOSPITAL_COMMUNITY): Payer: Self-pay

## 2023-06-27 ENCOUNTER — Other Ambulatory Visit: Payer: Self-pay

## 2023-06-27 ENCOUNTER — Emergency Department (HOSPITAL_COMMUNITY)
Admission: EM | Admit: 2023-06-27 | Discharge: 2023-06-27 | Disposition: A | Discharge status: Home / Self Care | Payer: BC Managed Care – PPO | Attending: Emergency Medicine | Admitting: Emergency Medicine

## 2023-06-27 DIAGNOSIS — R1011 Right upper quadrant pain: Secondary | ICD-10-CM | POA: Diagnosis not present

## 2023-06-27 DIAGNOSIS — K8020 Calculus of gallbladder without cholecystitis without obstruction: Secondary | ICD-10-CM | POA: Diagnosis not present

## 2023-06-27 DIAGNOSIS — R109 Unspecified abdominal pain: Secondary | ICD-10-CM | POA: Diagnosis not present

## 2023-06-27 LAB — URINALYSIS, ROUTINE W REFLEX MICROSCOPIC
Bacteria, UA: NONE SEEN
Bilirubin Urine: NEGATIVE
Glucose, UA: NEGATIVE mg/dL
Ketones, ur: NEGATIVE mg/dL
Leukocytes,Ua: NEGATIVE
Nitrite: NEGATIVE
Protein, ur: NEGATIVE mg/dL
Specific Gravity, Urine: 1.015 (ref 1.005–1.030)
pH: 6 (ref 5.0–8.0)

## 2023-06-27 LAB — HCG, SERUM, QUALITATIVE: Preg, Serum: NEGATIVE

## 2023-06-27 LAB — COMPREHENSIVE METABOLIC PANEL
ALT: 40 U/L (ref 0–44)
AST: 30 U/L (ref 15–41)
Albumin: 3.6 g/dL (ref 3.5–5.0)
Alkaline Phosphatase: 93 U/L (ref 38–126)
Anion gap: 11 (ref 5–15)
BUN: 8 mg/dL (ref 6–20)
CO2: 24 mmol/L (ref 22–32)
Calcium: 8.8 mg/dL — ABNORMAL LOW (ref 8.9–10.3)
Chloride: 102 mmol/L (ref 98–111)
Creatinine, Ser: 0.93 mg/dL (ref 0.44–1.00)
GFR, Estimated: >60 mL/min (ref >60)
Glucose, Bld: 99 mg/dL (ref 70–99)
Potassium: 3.1 mmol/L — ABNORMAL LOW (ref 3.5–5.1)
Sodium: 137 mmol/L (ref 135–145)
Total Bilirubin: 0.4 mg/dL (ref 0.3–1.2)
Total Protein: 7.4 g/dL (ref 6.5–8.1)

## 2023-06-27 LAB — CBC
HCT: 37.8 % (ref 36.0–46.0)
Hemoglobin: 12.5 g/dL (ref 12.0–15.0)
MCH: 30 pg (ref 26.0–34.0)
MCHC: 33.1 g/dL (ref 30.0–36.0)
MCV: 90.9 fL (ref 80.0–100.0)
Platelets: 294 10*3/uL (ref 150–400)
RBC: 4.16 MIL/uL (ref 3.87–5.11)
RDW: 13.5 % (ref 11.5–15.5)
WBC: 7.9 10*3/uL (ref 4.0–10.5)
nRBC: 0 % (ref 0.0–0.2)

## 2023-06-27 LAB — LIPASE, BLOOD: Lipase: 34 U/L (ref 11–51)

## 2023-06-27 MED ORDER — IOHEXOL 350 MG/ML SOLN
75.0000 mL | Freq: Once | INTRAVENOUS | Status: AC | PRN
  Administered 2023-06-27: 75 mL via INTRAVENOUS

## 2023-06-27 MED ORDER — POTASSIUM CHLORIDE CRYS ER 20 MEQ PO TBCR
40.0000 meq | EXTENDED_RELEASE_TABLET | Freq: Once | ORAL | Status: AC
  Administered 2023-06-27: 40 meq via ORAL
  Filled 2023-06-27: qty 2

## 2023-06-27 MED ORDER — ONDANSETRON HCL 4 MG/2ML IJ SOLN
4.0000 mg | Freq: Once | INTRAMUSCULAR | Status: AC
  Administered 2023-06-27: 4 mg via INTRAVENOUS
  Filled 2023-06-27: qty 2

## 2023-06-27 MED ORDER — ONDANSETRON 4 MG PO TBDP: 4.0000 mg | ORAL_TABLET | Freq: Three times a day (TID) | ORAL | 0 refills | Status: AC | PRN

## 2023-06-27 MED ORDER — OXYCODONE HCL 5 MG PO TABS: 5.0000 mg | ORAL_TABLET | Freq: Four times a day (QID) | ORAL | 0 refills | Status: AC | PRN

## 2023-06-27 MED ORDER — LACTATED RINGERS IV BOLUS
1000.0000 mL | Freq: Once | INTRAVENOUS | Status: AC
  Administered 2023-06-27: 1000 mL via INTRAVENOUS

## 2023-06-27 MED ORDER — MORPHINE SULFATE (PF) 4 MG/ML IV SOLN
4.0000 mg | Freq: Once | INTRAVENOUS | Status: AC
  Administered 2023-06-27: 4 mg via INTRAVENOUS
  Filled 2023-06-27: qty 1

## 2023-06-27 NOTE — Discharge Instructions (Signed)
Your workup today showed gallstones with no evidence of gallbladder infection.  Your pain improved after medication in the emergency department.  No other concerning cause of the abdominal pain on CT scan.  If you have any concerning symptoms return to the emergency room otherwise I have provided you with information for surgery clinic.  Please call them Monday to schedule a follow-up appointment.  I did send pain medication into the pharmacy for you.  Primarily use Tylenol 1000 mg every 8 hours, and ibuprofen 600 mg every 6-8 hours.  Reserve the pain medication for severe or breakthrough pain.  Nausea medication also sent sent to the pharmacy.

## 2023-06-27 NOTE — ED Provider Notes (Signed)
[facility] EMERGENCY DEPARTMENT AT [facility] Provider Note   CSN: [id number] Arrival date & time: 06/27/23  1914     History  Chief Complaint  Patient presents with   Abdominal Pain    Barbara Marshall is a 37 y.o. female.  37 year old female recently postpartum presents today for concern of right upper quadrant abdominal pain.  This started around midnight.  Initially was severe.  She states she cannot find a comfortable position.  She woke up her husband around 430 and ultimately decided to come into the ED for eval.  She states in March she was diagnosed with cholelithiasis however due to her pregnancy she was unable to undergo cholecystectomy.  She states she has done relatively well until last night.  She states pain felt similar to last episode in March.  Does endorse nausea but no vomiting.  No history of abdominal surgeries.  Denies dysuria.  Pain radiates to her right flank.  The history is provided by the patient. No language interpreter was used.       Home Medications Prior to Admission medications   Medication Sig Start Date End Date Taking? Authorizing Provider  abacavir-dolutegravir-lamiVUDine (TRIUMEQ) 600-50-300 MG tablet TAKE 1 TABLET BY MOUTH EVERY DAY 11/24/18   [provider]  albuterol (VENTOLIN HFA) 108 (90 Base) MCG/ACT inhaler Inhale 2 puffs into the lungs every 4 (four) hours as needed for wheezing or shortness of breath. 11/03/22   Particia Nearing, PA-C  cetirizine (ZYRTEC) 10 MG tablet Take 1 tablet (10 mg total) by mouth daily. 04/01/22   Leath-Warren, Sadie Haber, NP  fluticasone (FLONASE) 50 MCG/ACT nasal spray Place 2 sprays into both nostrils daily. 04/01/22   Leath-Warren, Sadie Haber, NP  gabapentin (NEURONTIN) 300 MG capsule TAKE 1 TO 2 TABLETS BY MOUTH AT BEDTIME 08/28/22   Cook, Dorie Rank G, DO  ibuprofen (ADVIL) 600 MG tablet Take 1 tablet (600 mg total) by mouth every 6 (six) hours. 06/03/23   Hoover Browns, MD  NIFEdipine  (PROCARDIA XL) 60 MG 24 hr tablet Take 1 tablet (60 mg total) by mouth in the morning and at bedtime. 05/13/23   Rolm Bookbinder, CNM  Prenatal Vit-Fe Fumarate-FA (MULTIVITAMIN-PRENATAL) 27-0.8 MG TABS tablet Take 1 tablet by mouth daily at 12 noon.    [provider]      Allergies    Citalopram    Review of Systems   Review of Systems  Constitutional:  Negative for chills and fever.  Gastrointestinal:  Positive for abdominal pain and nausea. Negative for vomiting.  Genitourinary:  Positive for flank pain. Negative for dysuria.  Neurological:  Negative for light-headedness.  All other systems reviewed and are negative.   Physical Exam Updated Vital Signs BP (!) 154/104   Pulse 65   Temp 97.9 F (36.6 C) (Oral)   Resp 20   SpO2 100%   Breastfeeding No  Physical Exam Vitals and nursing note reviewed.  Constitutional:      General: She is not in acute distress.    Appearance: Normal appearance. She is not ill-appearing.  HENT:     Head: Normocephalic and atraumatic.     Nose: Nose normal.  Eyes:     General: No scleral icterus.    Extraocular Movements: Extraocular movements intact.     Conjunctiva/sclera: Conjunctivae normal.  Cardiovascular:     Rate and Rhythm: Normal rate and regular rhythm.     Pulses: Normal pulses.     Heart sounds: Normal heart  sounds.  Pulmonary:     Effort: No respiratory distress.     Breath sounds: Normal breath sounds. No wheezing or rales.  Abdominal:     General: There is no distension.     Palpations: Abdomen is soft.     Tenderness: There is abdominal tenderness (RUQ, RLQ). There is no guarding.  Musculoskeletal:        General: Normal range of motion.     Cervical back: Normal range of motion.  Skin:    General: Skin is warm and dry.  Neurological:     General: No focal deficit present.     Mental Status: She is alert. Mental status is at baseline.     ED Results / Procedures / Treatments   Labs (all labs ordered  are listed, but only abnormal results are displayed) Labs Reviewed  COMPREHENSIVE METABOLIC PANEL - Abnormal; Notable for the following components:      Result Value   Potassium 3.1 (*)    Calcium 8.8 (*)    All other components within normal limits  URINALYSIS, ROUTINE W REFLEX MICROSCOPIC - Abnormal; Notable for the following components:   Hgb urine dipstick SMALL (*)    All other components within normal limits  LIPASE, BLOOD  CBC  HCG, SERUM, QUALITATIVE    EKG None  Radiology No results found.  Procedures Procedures    Medications Ordered in ED Medications  ondansetron (ZOFRAN) injection 4 mg (has no administration in time range)  morphine (PF) 4 MG/ML injection 4 mg (has no administration in time range)  lactated ringers bolus 1,000 mL (has no administration in time range)    ED Course/ Medical Decision Making/ A&P Clinical Course as of 06/27/23 1028  Sat Jun 27, 2023  0915 Right upper quadrant ultrasound shows cholelithiasis with no evidence of acute cholecystitis.  Still reports 5/10 pain.  Without leukocytosis and afebrile.  Following our discussion she agrees to obtain a CT scan to rule out other concerning causes of abdominal pain. [AA]    Clinical Course User Index [AA] Marita Kansas, PA-C                             Medical Decision Making Amount and/or Complexity of Data Reviewed Labs: ordered. Radiology: ordered.  Risk Prescription drug management.   Medical Decision Making / ED Course   This patient presents to the ED for concern of abdominal pain, this involves an extensive number of treatment options, and is a complaint that carries with it a high risk of complications and morbidity.  The differential diagnosis includes cholecystitis, cholelithiasis, appendicitis, pancreatitis, colitis, gastroenteritis, UTI, nephrolithiasis, pyelonephritis  MDM: 37 year old female presents today for evaluation of right upper quadrant abdominal pain.  Postpartum  about 3 weeks.  History of cholelithiasis.  Pain significantly improved from onset.  CBC without leukocytosis.  Potassium at 3.1 otherwise no acute concerns.  UA without evidence of UTI.  Right upper quadrant ultrasound ordered.  Will provide pain control, fluid bolus and reevaluate.  P.o. potassium repletion given.  Right upper quadrant ultrasound without evidence of cholecystitis.  Cholelithiasis noted.  CT obtained.  No evidence of other concerning etiology for presentation.  Significantly improve since time of arrival pain control wise.  Will discharge with surgery clinic follow-up. Nausea and pain medicine prescribed.  Patient voices understanding and is in agreement with plan.   Lab Tests: -I ordered, reviewed, and interpreted labs.   The pertinent results include:  Labs Reviewed  COMPREHENSIVE METABOLIC PANEL - Abnormal; Notable for the following components:      Result Value   Potassium 3.1 (*)    Calcium 8.8 (*)    All other components within normal limits  URINALYSIS, ROUTINE W REFLEX MICROSCOPIC - Abnormal; Notable for the following components:   Hgb urine dipstick SMALL (*)    All other components within normal limits  LIPASE, BLOOD  CBC  HCG, SERUM, QUALITATIVE      EKG  EKG Interpretation Date/Time:    Ventricular Rate:    PR Interval:    QRS Duration:    QT Interval:    QTC Calculation:   R Axis:      Text Interpretation:           Imaging Studies ordered: I ordered imaging studies including right upper quadrant ultrasound, CT abdomen pelvis with contrast I independently visualized and interpreted imaging. I agree with the radiologist interpretation   Medicines ordered and prescription drug management: Meds ordered this encounter  Medications   ondansetron (ZOFRAN) injection 4 mg   morphine (PF) 4 MG/ML injection 4 mg   lactated ringers bolus 1,000 mL   potassium chloride SA (KLOR-CON M) CR tablet 40 mEq   iohexol (OMNIPAQUE) 350 MG/ML injection  75 mL   oxyCODONE (ROXICODONE) 5 MG immediate release tablet    Sig: Take 1 tablet (5 mg total) by mouth every 6 (six) hours as needed for severe pain or breakthrough pain.    Dispense:  15 tablet    Refill:  0    Order Specific Question:   Supervising Provider    Answer:   MILLER, BRIAN [[id number]]   ondansetron (ZOFRAN-ODT) 4 MG disintegrating tablet    Sig: Take 1 tablet (4 mg total) by mouth every 8 (eight) hours as needed.    Dispense:  20 tablet    Refill:  0    Order Specific Question:   Supervising Provider    Answer:   Hyacinth Meeker, BRIAN [[id number]]    -I have reviewed the patients home medicines and have made adjustments as needed  Reevaluation: After the interventions noted above, I reevaluated the patient and found that they have :improved  Co morbidities that complicate the patient evaluation  Past Medical History:  Diagnosis Date   Anxiety    Anxiety and depression 10/21/2019   Asthma    Cholelithiasis    Dysmenorrhea    H/O   Gestational diabetes    Heart murmur    History of IBS    History of pre-eclampsia    HIV infection (HCC)    HSV infection    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  Return precaution discussed.  Patient voiced understanding and is in agreement with plan.  Final Clinical Impression(s) / ED Diagnoses Final diagnoses:  Calculus of gallbladder without cholecystitis without obstruction  Right upper quadrant abdominal pain    Rx / DC Orders ED Discharge Orders          Ordered    oxyCODONE (ROXICODONE) 5 MG immediate release tablet  Every 6 hours PRN        06/27/23 1027    ondansetron (ZOFRAN-ODT) 4 MG disintegrating tablet  Every  8  hours PRN        06/27/23 1027              Marita Kansas, [place] 06/27/23 1032    Gwyneth Sprout, MD 06/28/23 1320

## 2023-06-27 NOTE — ED Triage Notes (Signed)
Pt arrived from home via POV c/o RUQ abd pain 7/10 described at stabbing, began around midnight. Nausea but no emesis.

## 2023-06-29 ENCOUNTER — Telehealth (HOSPITAL_COMMUNITY): Payer: Self-pay | Admitting: *Deleted

## 2023-06-29 NOTE — Telephone Encounter (Signed)
06/29/2023  Name: Barbara Marshall MRN: [id number] DOB: 10-15-1986  Reason for Call:  Transition of Care Hospital Discharge Call  Contact Status: Patient Contact Status: Message  Attempted to complete Hospital Discharge F/U Call.  Left voice mail.  Language assistant needed: Interpreter Mode: Interpreter Not Needed        Follow-Up Questions:    Inocente Salles Postnatal Depression Scale:  In the Past 7 Days:    PHQ2-9 Depression Scale:     Discharge Follow-up:    Post-discharge interventions: NA  Salena Saner, RN 06/29/2023 12:28

## 2023-07-01 DIAGNOSIS — K8020 Calculus of gallbladder without cholecystitis without obstruction: Secondary | ICD-10-CM | POA: Diagnosis not present

## 2023-07-06 DIAGNOSIS — Z3201 Encounter for pregnancy test, result positive: Secondary | ICD-10-CM | POA: Diagnosis not present

## 2023-07-06 DIAGNOSIS — Z309 Encounter for contraceptive management, unspecified: Secondary | ICD-10-CM | POA: Diagnosis not present

## 2023-07-06 DIAGNOSIS — Z3040 Encounter for surveillance of contraceptives, unspecified: Secondary | ICD-10-CM | POA: Diagnosis not present

## 2023-07-10 ENCOUNTER — Ambulatory Visit: Payer: BC Managed Care – PPO | Admitting: Family Medicine

## 2023-07-10 DIAGNOSIS — I10 Essential (primary) hypertension: Secondary | ICD-10-CM | POA: Diagnosis not present

## 2023-07-10 NOTE — Patient Instructions (Signed)
Goal <130/80.  Message with concerns.  Follow up in 6 months.

## 2023-07-12 DIAGNOSIS — I10 Essential (primary) hypertension: Secondary | ICD-10-CM | POA: Insufficient documentation

## 2023-07-12 MED ORDER — NIFEDIPINE ER OSMOTIC RELEASE 60 MG PO TB24: 60.0000 mg | ORAL_TABLET | Freq: Two times a day (BID) | ORAL | 3 refills | Status: AC

## 2023-07-12 NOTE — Progress Notes (Signed)
Subjective:  Patient ID: Barbara Marshall, female    DOB: 05/22/1986  Age: 37 y.o. MRN: [id number]  CC: Chief Complaint  Patient presents with   Hypertension    Want to get back on valsartan postpartum- baby is about 51 weeks old     HPI:  37 year old female presents for evaluation of the above.  Currently on Nifedipine (this was used during recent pregnancy). She was on Valsartan previously and is wondering if she should return to this. No chest pain or SOB. No other complaints or concerns at this time.   Patient Active Problem List   Diagnosis Date Noted   Essential hypertension 07/12/2023   GDM (gestational diabetes mellitus) 05/20/2023   History of severe pre-eclampsia 11/30/2022   Genital herpes simplex 12/14/2018   Human immunodeficiency virus infection (HCC) 01/09/2018   Hypovitaminosis D 02/04/2017   Anxiety 11/29/2016   Seasonal allergies 07/25/2015    Social Hx   Social History   Socioeconomic History   Marital status: Married    Spouse name: Not on file   Number of children: Not on file   Years of education: Not on file   Highest education level: Some college, no degree  Occupational History   Not on file  Tobacco Use   Smoking status: Never   Smokeless tobacco: Never  Vaping Use   Vaping status: Never Used  Substance and Sexual Activity   Alcohol use: Not Currently    Comment: SOCIAL rare once year   Drug use: No   Sexual activity: Not Currently    Birth control/protection: Condom  Other Topics Concern   Not on file  Social History Narrative   Caffiene 1 dr pepper daily (8-16oz). Tea decaff.      Education:  some college      Work [employer]      Married, 2 Bio and 1 Step Daughter.   Social Determinants of Health   Financial Resource Strain: Low Risk  (02/24/2023)   Overall Financial Resource Strain (CARDIA)    Difficulty of Paying Living Expenses: Not hard at all  Food Insecurity: No Food Insecurity (06/01/2023)   Hunger Vital  Sign    Worried About Running Out of Food in the Last Year: Never true    Ran Out of Food in the Last Year: Never true  Transportation Needs: No Transportation Needs (06/01/2023)   PRAPARE - Administrator, Civil Service (Medical): No    Lack of Transportation (Non-Medical): No  Physical Activity: Insufficiently Active (02/24/2023)   Exercise Vital Sign    Days of Exercise per Week: 1 day    Minutes of Exercise per Session: 20 min  Stress: Stress Concern Present (02/24/2023)   [employer] of Occupational Health - Occupational Stress Questionnaire    Feeling of Stress : To some extent  Social Connections: Socially Integrated (02/24/2023)   Social Connection and Isolation Panel [NHANES]    Frequency of Communication with Friends and Family: Three times a week    Frequency of Social Gatherings with Friends and Family: Twice a week    Attends Religious Services: More than 4 times per year    Active Member of Clubs or Organizations: Yes    Attends Engineer, structural: More than 4 times per year    Marital Status: Married    Review of Systems Per HPI  Objective:  BP 128/84   Pulse 72   Temp (!) 97.2 F (36.2 C)  Ht 5\' 3"  (1.6 m)   Wt 190 lb (86.2 kg)   SpO2 98%   BMI 33.66 kg/m      07/10/2023    9:55 AM 06/27/2023    9:30 AM 06/27/2023    6:32 AM  BP/Weight  Systolic BP 128 142 154  Diastolic BP 84 90 104  Wt. (Lbs) 190    BMI 33.66 kg/m2      Physical Exam Vitals and nursing note reviewed.  Constitutional:      General: She is not in acute distress.    Appearance: Normal appearance.  HENT:     Head: Normocephalic and atraumatic.  Eyes:     General:        Right eye: No discharge.        Left eye: No discharge.     Conjunctiva/sclera: Conjunctivae normal.  Cardiovascular:     Rate and Rhythm: Normal rate and regular rhythm.  Pulmonary:     Effort: Pulmonary effort is normal.     Breath sounds: Normal breath sounds. No wheezing, rhonchi  or rales.  Neurological:     Mental Status: She is alert.  Psychiatric:        Mood and Affect: Mood normal.        Behavior: Behavior normal.     Lab Results  Component Value Date   WBC 7.9 06/27/2023   HGB 12.5 06/27/2023   HCT 37.8 06/27/2023   PLT 294 06/27/2023   GLUCOSE 99 06/27/2023   CHOL 151 11/28/2016   TRIG 83 11/28/2016   HDL 55 11/28/2016   LDLCALC 79 11/28/2016   ALT 40 06/27/2023   AST 30 06/27/2023   NA 137 06/27/2023   K 3.1 (L) 06/27/2023   CL 102 06/27/2023   CREATININE 0.93 06/27/2023   BUN 8 06/27/2023   CO2 24 06/27/2023   TSH 1.040 10/21/2022   HGBA1C 5.0 01/07/2017     Assessment & Plan:   Problem List Items Addressed This Visit       Cardiovascular and Mediastinum   Essential hypertension - Primary    We discuss continuing Nifedipine vs switching back to ARB. She will continue Nifedipine and monitor BP closely.       Relevant Medications   NIFEdipine (PROCARDIA XL) 60 MG 24 hr tablet   Meds ordered this encounter  Medications   NIFEdipine (PROCARDIA XL) 60 MG 24 hr tablet    Sig: Take 1 tablet (60 mg total) by mouth in the morning and at bedtime.    Dispense:  180 tablet    Refill:  3    Follow-up:  Return in about 6 months (around 01/10/2024).  Everlene Other DO [facility]

## 2023-07-12 NOTE — Assessment & Plan Note (Signed)
We discuss continuing Nifedipine vs switching back to ARB. She will continue Nifedipine and monitor BP closely.

## 2023-08-12 ENCOUNTER — Other Ambulatory Visit: Payer: Self-pay | Admitting: Surgery

## 2023-08-12 DIAGNOSIS — K8010 Calculus of gallbladder with chronic cholecystitis without obstruction: Secondary | ICD-10-CM | POA: Diagnosis not present

## 2023-08-12 DIAGNOSIS — K8066 Calculus of gallbladder and bile duct with acute and chronic cholecystitis without obstruction: Secondary | ICD-10-CM | POA: Diagnosis not present

## 2023-08-12 DIAGNOSIS — K812 Acute cholecystitis with chronic cholecystitis: Secondary | ICD-10-CM | POA: Diagnosis not present

## 2024-01-11 ENCOUNTER — Ambulatory Visit: Payer: BC Managed Care – PPO | Admitting: Family Medicine

## 2024-07-18 DIAGNOSIS — B20 Human immunodeficiency virus [HIV] disease: Secondary | ICD-10-CM | POA: Diagnosis not present

## 2024-07-18 DIAGNOSIS — G2581 Restless legs syndrome: Secondary | ICD-10-CM | POA: Diagnosis not present

## 2024-07-18 DIAGNOSIS — F5101 Primary insomnia: Secondary | ICD-10-CM | POA: Diagnosis not present

## 2024-07-18 DIAGNOSIS — G4733 Obstructive sleep apnea (adult) (pediatric): Secondary | ICD-10-CM | POA: Diagnosis not present

## 2024-07-29 ENCOUNTER — Encounter: Payer: Self-pay | Admitting: Radiology

## 2024-08-04 DIAGNOSIS — N951 Menopausal and female climacteric states: Secondary | ICD-10-CM | POA: Diagnosis not present

## 2024-08-04 DIAGNOSIS — R891 Abnormal level of hormones in specimens from other organs, systems and tissues: Secondary | ICD-10-CM | POA: Diagnosis not present

## 2024-08-04 DIAGNOSIS — R5383 Other fatigue: Secondary | ICD-10-CM | POA: Diagnosis not present

## 2024-08-04 DIAGNOSIS — R5382 Chronic fatigue, unspecified: Secondary | ICD-10-CM | POA: Diagnosis not present

## 2024-08-04 DIAGNOSIS — L659 Nonscarring hair loss, unspecified: Secondary | ICD-10-CM | POA: Diagnosis not present

## 2024-08-04 DIAGNOSIS — L658 Other specified nonscarring hair loss: Secondary | ICD-10-CM | POA: Diagnosis not present

## 2024-08-04 DIAGNOSIS — R6882 Decreased libido: Secondary | ICD-10-CM | POA: Diagnosis not present

## 2024-10-10 ENCOUNTER — Encounter: Payer: Self-pay | Admitting: Radiology

## 2024-10-25 DIAGNOSIS — R7989 Other specified abnormal findings of blood chemistry: Secondary | ICD-10-CM | POA: Diagnosis not present

## 2024-10-25 DIAGNOSIS — R891 Abnormal level of hormones in specimens from other organs, systems and tissues: Secondary | ICD-10-CM | POA: Diagnosis not present

## 2024-10-25 DIAGNOSIS — R5382 Chronic fatigue, unspecified: Secondary | ICD-10-CM | POA: Diagnosis not present
